# Patient Record
Sex: Female | Born: 1990 | Race: White | Hispanic: No | Smoking: Former smoker
Health system: Southern US, Community
[De-identification: ages and names within clinical notes are randomized; demographics above are authoritative.]

## PROBLEM LIST (undated history)

## (undated) DIAGNOSIS — N83209 Unspecified ovarian cyst, unspecified side: Secondary | ICD-10-CM

## (undated) DIAGNOSIS — K219 Gastro-esophageal reflux disease without esophagitis: Secondary | ICD-10-CM

## (undated) DIAGNOSIS — Z720 Tobacco use: Secondary | ICD-10-CM

## (undated) DIAGNOSIS — F419 Anxiety disorder, unspecified: Secondary | ICD-10-CM

## (undated) DIAGNOSIS — K760 Fatty (change of) liver, not elsewhere classified: Secondary | ICD-10-CM

## (undated) DIAGNOSIS — R000 Tachycardia, unspecified: Secondary | ICD-10-CM

## (undated) DIAGNOSIS — G4700 Insomnia, unspecified: Secondary | ICD-10-CM

## (undated) HISTORY — DX: Anxiety disorder, unspecified: F41.9

## (undated) HISTORY — DX: Unspecified ovarian cyst, unspecified side: N83.209

## (undated) HISTORY — PX: OTHER SURGICAL HISTORY: SHX169

## (undated) HISTORY — DX: Tobacco use: Z72.0

## (undated) HISTORY — PX: CHOLECYSTECTOMY: SHX55

## (undated) HISTORY — PX: INDUCED ABORTION: SHX677

---
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
## 2009-09-05 ENCOUNTER — Emergency Department: Payer: Self-pay | Admitting: Emergency Medicine

## 2009-11-10 ENCOUNTER — Emergency Department: Payer: Self-pay | Admitting: Emergency Medicine

## 2009-12-11 ENCOUNTER — Emergency Department: Payer: Self-pay | Admitting: Emergency Medicine

## 2010-07-02 ENCOUNTER — Emergency Department: Payer: Self-pay | Admitting: Unknown Physician Specialty

## 2010-08-15 ENCOUNTER — Emergency Department: Payer: Self-pay | Admitting: Emergency Medicine

## 2011-05-18 ENCOUNTER — Emergency Department: Payer: Self-pay | Admitting: Emergency Medicine

## 2011-09-30 ENCOUNTER — Observation Stay: Payer: Self-pay | Admitting: Obstetrics and Gynecology

## 2011-10-30 ENCOUNTER — Observation Stay: Payer: Self-pay

## 2011-10-30 LAB — URINALYSIS, COMPLETE
Bilirubin,UR: NEGATIVE
Blood: NEGATIVE
Glucose,UR: NEGATIVE mg/dL (ref 0–75)
Nitrite: NEGATIVE
Ph: 5 (ref 4.5–8.0)
Protein: 30
RBC,UR: 8 /HPF (ref 0–5)
WBC UR: 50 /HPF (ref 0–5)

## 2011-11-01 LAB — URINE CULTURE

## 2011-12-11 ENCOUNTER — Observation Stay: Payer: Self-pay

## 2012-01-10 ENCOUNTER — Observation Stay: Payer: Self-pay | Admitting: Obstetrics and Gynecology

## 2012-01-10 LAB — PIH PROFILE
BUN: 8 mg/dL (ref 7–18)
Co2: 22 mmol/L (ref 21–32)
Creatinine: 0.96 mg/dL (ref 0.60–1.30)
EGFR (African American): >60
EGFR (Non-African Amer.): >60
HCT: 33.3 % — ABNORMAL LOW (ref 35.0–47.0)
HGB: 11.4 g/dL — ABNORMAL LOW (ref 12.0–16.0)
MCH: 28.6 pg (ref 26.0–34.0)
MCHC: 34.1 g/dL (ref 32.0–36.0)
Potassium: 3.5 mmol/L (ref 3.5–5.1)
RBC: 3.97 10*6/uL (ref 3.80–5.20)
SGOT(AST): 23 U/L (ref 15–37)
Sodium: 140 mmol/L (ref 136–145)
WBC: 16.1 10*3/uL — ABNORMAL HIGH (ref 3.6–11.0)

## 2012-01-10 LAB — PROTEIN / CREATININE RATIO, URINE: Protein/Creat. Ratio: 193 mg/gCREAT (ref 0–200)

## 2012-01-12 ENCOUNTER — Inpatient Hospital Stay: Payer: Self-pay

## 2012-01-12 LAB — PIH PROFILE
Anion Gap: 14 (ref 7–16)
BUN: 7 mg/dL (ref 7–18)
Chloride: 106 mmol/L (ref 98–107)
Co2: 19 mmol/L — ABNORMAL LOW (ref 21–32)
Glucose: 85 mg/dL (ref 65–99)
MCH: 28.8 pg (ref 26.0–34.0)
MCV: 85 fL (ref 80–100)
Osmolality: 275 (ref 275–301)
Platelet: 262 10*3/uL (ref 150–440)
Potassium: 3.6 mmol/L (ref 3.5–5.1)
RBC: 4.11 10*6/uL (ref 3.80–5.20)
Sodium: 139 mmol/L (ref 136–145)

## 2012-01-13 LAB — PROTEIN / CREATININE RATIO, URINE: Protein/Creat. Ratio: 181 mg/gCREAT (ref 0–200)

## 2012-01-15 LAB — HEMATOCRIT: HCT: 29.7 % — ABNORMAL LOW (ref 35.0–47.0)

## 2012-01-18 ENCOUNTER — Emergency Department: Payer: Self-pay | Admitting: Unknown Physician Specialty

## 2012-01-18 LAB — URINALYSIS, COMPLETE
Bacteria: NONE SEEN
Bilirubin,UR: NEGATIVE
Glucose,UR: NEGATIVE mg/dL (ref 0–75)
Nitrite: NEGATIVE
Ph: 7 (ref 4.5–8.0)
Protein: NEGATIVE
WBC UR: 23 /HPF (ref 0–5)

## 2012-01-18 LAB — COMPREHENSIVE METABOLIC PANEL
Albumin: 2.5 g/dL — ABNORMAL LOW (ref 3.4–5.0)
Anion Gap: 11 (ref 7–16)
BUN: 7 mg/dL (ref 7–18)
Calcium, Total: 8.2 mg/dL — ABNORMAL LOW (ref 8.5–10.1)
Chloride: 106 mmol/L (ref 98–107)
Creatinine: 0.74 mg/dL (ref 0.60–1.30)
EGFR (African American): >60
EGFR (Non-African Amer.): >60
Glucose: 81 mg/dL (ref 65–99)
Potassium: 3.4 mmol/L — ABNORMAL LOW (ref 3.5–5.1)
SGOT(AST): 49 U/L — ABNORMAL HIGH (ref 15–37)
SGPT (ALT): 41 U/L (ref 12–78)
Sodium: 141 mmol/L (ref 136–145)
Total Protein: 6.7 g/dL (ref 6.4–8.2)

## 2012-01-18 LAB — CBC
HGB: 11.3 g/dL — ABNORMAL LOW (ref 12.0–16.0)
MCH: 27.8 pg (ref 26.0–34.0)
Platelet: 337 10*3/uL (ref 150–440)
RBC: 4.05 10*6/uL (ref 3.80–5.20)
RDW: 14.3 % (ref 11.5–14.5)

## 2012-01-25 ENCOUNTER — Emergency Department: Payer: Self-pay | Admitting: Emergency Medicine

## 2012-05-08 ENCOUNTER — Emergency Department: Payer: Self-pay | Admitting: Emergency Medicine

## 2012-05-08 LAB — COMPREHENSIVE METABOLIC PANEL
Alkaline Phosphatase: 100 U/L (ref 50–136)
Bilirubin,Total: 0.3 mg/dL (ref 0.2–1.0)
Chloride: 110 mmol/L — ABNORMAL HIGH (ref 98–107)
Creatinine: 0.7 mg/dL (ref 0.60–1.30)
EGFR (Non-African Amer.): >60
Glucose: 89 mg/dL (ref 65–99)
Osmolality: 281 (ref 275–301)
SGOT(AST): 18 U/L (ref 15–37)

## 2012-05-08 LAB — URINALYSIS, COMPLETE
Bilirubin,UR: NEGATIVE
Ketone: NEGATIVE
Ph: 5 (ref 4.5–8.0)
Protein: 30
RBC,UR: 1 /HPF (ref 0–5)
Specific Gravity: 1.028 (ref 1.003–1.030)
WBC UR: 6 /HPF (ref 0–5)

## 2012-05-08 LAB — CBC
MCV: 83 fL (ref 80–100)
RBC: 4.57 10*6/uL (ref 3.80–5.20)
RDW: 14.7 % — ABNORMAL HIGH (ref 11.5–14.5)

## 2012-05-08 LAB — HCG, QUANTITATIVE, PREGNANCY: Beta Hcg, Quant.: 2366 m[IU]/mL — ABNORMAL HIGH

## 2012-05-09 LAB — WET PREP, GENITAL

## 2012-10-06 ENCOUNTER — Observation Stay: Payer: Self-pay | Admitting: Advanced Practice Midwife

## 2012-10-06 LAB — URINALYSIS, COMPLETE
Bilirubin,UR: NEGATIVE
Glucose,UR: NEGATIVE mg/dL (ref 0–75)
Ketone: NEGATIVE
Nitrite: NEGATIVE
Ph: 5 (ref 4.5–8.0)
Specific Gravity: 1.029 (ref 1.003–1.030)

## 2012-10-06 LAB — COMPREHENSIVE METABOLIC PANEL
Albumin: 3 g/dL — ABNORMAL LOW (ref 3.4–5.0)
Alkaline Phosphatase: 120 U/L (ref 50–136)
BUN: 7 mg/dL (ref 7–18)
Bilirubin,Total: 0.6 mg/dL (ref 0.2–1.0)
Calcium, Total: 8.9 mg/dL (ref 8.5–10.1)
Chloride: 105 mmol/L (ref 98–107)
Creatinine: 0.71 mg/dL (ref 0.60–1.30)
EGFR (African American): >60
EGFR (Non-African Amer.): >60
Osmolality: 271 (ref 275–301)
Potassium: 3.9 mmol/L (ref 3.5–5.1)
SGOT(AST): 66 U/L — ABNORMAL HIGH (ref 15–37)
SGPT (ALT): 52 U/L (ref 12–78)

## 2012-10-06 LAB — LIPASE, BLOOD: Lipase: 568 U/L — ABNORMAL HIGH (ref 73–393)

## 2012-10-06 LAB — CBC WITH DIFFERENTIAL/PLATELET
Basophil %: 0.2 %
Eosinophil #: 0 10*3/uL (ref 0.0–0.7)
Eosinophil %: 0.1 %
HCT: 37.6 % (ref 35.0–47.0)
HGB: 13 g/dL (ref 12.0–16.0)
Lymphocyte #: 1.7 10*3/uL (ref 1.0–3.6)
Lymphocyte %: 10.1 %
MCHC: 34.6 g/dL (ref 32.0–36.0)
MCV: 88 fL (ref 80–100)
Monocyte %: 3.8 %
Neutrophil #: 14.3 10*3/uL — ABNORMAL HIGH (ref 1.4–6.5)
Platelet: 244 10*3/uL (ref 150–440)
WBC: 16.7 10*3/uL — ABNORMAL HIGH (ref 3.6–11.0)

## 2012-10-07 LAB — HEPATIC FUNCTION PANEL A (ARMC)
Albumin: 2.5 g/dL — ABNORMAL LOW (ref 3.4–5.0)
Alkaline Phosphatase: 125 U/L (ref 50–136)
Bilirubin, Direct: 0.1 mg/dL (ref 0.00–0.20)
Bilirubin,Total: 0.5 mg/dL (ref 0.2–1.0)
SGOT(AST): 51 U/L — ABNORMAL HIGH (ref 15–37)
SGPT (ALT): 46 U/L (ref 12–78)
Total Protein: 6.6 g/dL (ref 6.4–8.2)

## 2012-10-07 LAB — LIPASE, BLOOD: Lipase: 154 U/L (ref 73–393)

## 2012-11-07 ENCOUNTER — Inpatient Hospital Stay: Payer: Self-pay | Admitting: Internal Medicine

## 2012-11-07 LAB — COMPREHENSIVE METABOLIC PANEL
Anion Gap: 7 (ref 7–16)
BUN: 7 mg/dL (ref 7–18)
Bilirubin,Total: 1.1 mg/dL — ABNORMAL HIGH (ref 0.2–1.0)
Chloride: 104 mmol/L (ref 98–107)
Co2: 25 mmol/L (ref 21–32)
Creatinine: 0.73 mg/dL (ref 0.60–1.30)
EGFR (Non-African Amer.): >60
Glucose: 84 mg/dL (ref 65–99)
Osmolality: 269 (ref 275–301)
SGPT (ALT): 18 U/L (ref 12–78)
Sodium: 136 mmol/L (ref 136–145)
Total Protein: 7.2 g/dL (ref 6.4–8.2)

## 2012-11-07 LAB — URINALYSIS, COMPLETE
Blood: NEGATIVE
Nitrite: NEGATIVE
Ph: 5 (ref 4.5–8.0)
RBC,UR: 3 /HPF (ref 0–5)
Specific Gravity: 1.025 (ref 1.003–1.030)

## 2012-11-07 LAB — CBC
HCT: 36 % (ref 35.0–47.0)
MCHC: 34.8 g/dL (ref 32.0–36.0)
MCV: 88 fL (ref 80–100)
RBC: 4.11 10*6/uL (ref 3.80–5.20)

## 2012-11-08 LAB — BASIC METABOLIC PANEL
BUN: 7 mg/dL (ref 7–18)
Chloride: 108 mmol/L — ABNORMAL HIGH (ref 98–107)
Co2: 22 mmol/L (ref 21–32)
EGFR (Non-African Amer.): >60
Glucose: 66 mg/dL (ref 65–99)
Potassium: 3.8 mmol/L (ref 3.5–5.1)
Sodium: 138 mmol/L (ref 136–145)

## 2012-11-08 LAB — CBC WITH DIFFERENTIAL/PLATELET
Basophil #: 0 10*3/uL (ref 0.0–0.1)
Eosinophil #: 0.1 10*3/uL (ref 0.0–0.7)
HCT: 32.5 % — ABNORMAL LOW (ref 35.0–47.0)
HGB: 11.5 g/dL — ABNORMAL LOW (ref 12.0–16.0)
Lymphocyte #: 1.9 10*3/uL (ref 1.0–3.6)
MCHC: 35.4 g/dL (ref 32.0–36.0)
MCV: 87 fL (ref 80–100)
Monocyte #: 0.6 x10 3/mm (ref 0.2–0.9)
Neutrophil #: 7.6 10*3/uL — ABNORMAL HIGH (ref 1.4–6.5)
RBC: 3.71 10*6/uL — ABNORMAL LOW (ref 3.80–5.20)
RDW: 13.1 % (ref 11.5–14.5)

## 2012-11-08 LAB — LIPASE, BLOOD: Lipase: 114 U/L (ref 73–393)

## 2012-11-17 ENCOUNTER — Inpatient Hospital Stay: Payer: Self-pay | Admitting: Obstetrics and Gynecology

## 2012-11-17 LAB — COMPREHENSIVE METABOLIC PANEL
Albumin: 2.8 g/dL — ABNORMAL LOW (ref 3.4–5.0)
Anion Gap: 7 (ref 7–16)
BUN: 8 mg/dL (ref 7–18)
Chloride: 105 mmol/L (ref 98–107)
Co2: 24 mmol/L (ref 21–32)
Osmolality: 269 (ref 275–301)
SGOT(AST): 36 U/L (ref 15–37)
SGPT (ALT): 29 U/L (ref 12–78)
Sodium: 136 mmol/L (ref 136–145)
Total Protein: 6.9 g/dL (ref 6.4–8.2)

## 2012-11-17 LAB — URINALYSIS, COMPLETE
Glucose,UR: NEGATIVE mg/dL (ref 0–75)
Ph: 6 (ref 4.5–8.0)
Squamous Epithelial: 93
WBC UR: 29 /HPF (ref 0–5)

## 2012-11-17 LAB — CBC WITH DIFFERENTIAL/PLATELET
Basophil #: 0.1 10*3/uL (ref 0.0–0.1)
Eosinophil #: 0 10*3/uL (ref 0.0–0.7)
Eosinophil %: 0.2 %
HCT: 35.5 % (ref 35.0–47.0)
HGB: 12.3 g/dL (ref 12.0–16.0)
Lymphocyte #: 1.3 10*3/uL (ref 1.0–3.6)
MCH: 30.1 pg (ref 26.0–34.0)
MCHC: 34.7 g/dL (ref 32.0–36.0)
Monocyte %: 5.4 %
Neutrophil %: 83.3 %
RBC: 4.09 10*6/uL (ref 3.80–5.20)
RDW: 13.2 % (ref 11.5–14.5)
WBC: 12.5 10*3/uL — ABNORMAL HIGH (ref 3.6–11.0)

## 2012-11-17 LAB — HCG, QUANTITATIVE, PREGNANCY: Beta Hcg, Quant.: 3098 m[IU]/mL — ABNORMAL HIGH

## 2012-11-18 LAB — LIPASE, BLOOD: Lipase: 196 U/L (ref 73–393)

## 2012-11-21 LAB — URINE CULTURE

## 2012-12-29 ENCOUNTER — Inpatient Hospital Stay: Payer: Self-pay | Admitting: Obstetrics and Gynecology

## 2012-12-29 LAB — CBC WITH DIFFERENTIAL/PLATELET
Basophil #: 0.2 10*3/uL — ABNORMAL HIGH (ref 0.0–0.1)
Eosinophil %: 0.4 %
Lymphocyte %: 12.2 %
MCH: 29.9 pg (ref 26.0–34.0)
MCHC: 35.1 g/dL (ref 32.0–36.0)
Monocyte #: 1.2 x10 3/mm — ABNORMAL HIGH (ref 0.2–0.9)
Monocyte %: 7 %
Neutrophil #: 13.1 10*3/uL — ABNORMAL HIGH (ref 1.4–6.5)
Platelet: 228 10*3/uL (ref 150–440)
RBC: 4.07 10*6/uL (ref 3.80–5.20)
RDW: 13.8 % (ref 11.5–14.5)
WBC: 16.7 10*3/uL — ABNORMAL HIGH (ref 3.6–11.0)

## 2012-12-29 LAB — GC/CHLAMYDIA PROBE AMP

## 2012-12-30 LAB — HEMATOCRIT: HCT: 31.3 % — ABNORMAL LOW (ref 35.0–47.0)

## 2013-02-11 ENCOUNTER — Emergency Department: Payer: Self-pay | Admitting: Emergency Medicine

## 2013-02-11 LAB — COMPREHENSIVE METABOLIC PANEL WITH GFR
Albumin: 3.7 g/dL
Alkaline Phosphatase: 115 U/L
Anion Gap: 4 — ABNORMAL LOW
BUN: 14 mg/dL
Bilirubin,Total: 0.3 mg/dL
Calcium, Total: 8.6 mg/dL
Chloride: 107 mmol/L
Co2: 28 mmol/L
Creatinine: 0.97 mg/dL
EGFR (African American): >60
EGFR (Non-African Amer.): >60
Glucose: 100 mg/dL — ABNORMAL HIGH
Osmolality: 278
Potassium: 3.8 mmol/L
SGOT(AST): 44 U/L — ABNORMAL HIGH
SGPT (ALT): 26 U/L
Sodium: 139 mmol/L
Total Protein: 8.1 g/dL

## 2013-02-11 LAB — LIPASE, BLOOD: Lipase: 121 U/L (ref 73–393)

## 2013-02-11 LAB — CBC WITH DIFFERENTIAL/PLATELET
Basophil #: 0.1 10*3/uL (ref 0.0–0.1)
Basophil %: 1 %
Eosinophil #: 0 10*3/uL (ref 0.0–0.7)
Eosinophil %: 0.3 %
HCT: 40.3 % (ref 35.0–47.0)
HGB: 13.4 g/dL (ref 12.0–16.0)
Lymphocyte #: 1.9 10*3/uL (ref 1.0–3.6)
Lymphocyte %: 14.8 %
MCHC: 33.2 g/dL (ref 32.0–36.0)
MCV: 84 fL (ref 80–100)
Monocyte #: 0.5 x10 3/mm (ref 0.2–0.9)
Neutrophil #: 10.4 10*3/uL — ABNORMAL HIGH (ref 1.4–6.5)
Neutrophil %: 80.4 %
Platelet: 316 10*3/uL (ref 150–440)
RDW: 13.8 % (ref 11.5–14.5)
WBC: 12.9 10*3/uL — ABNORMAL HIGH (ref 3.6–11.0)

## 2013-02-12 LAB — URINALYSIS, COMPLETE
Bilirubin,UR: NEGATIVE
Blood: NEGATIVE
Glucose,UR: NEGATIVE mg/dL (ref 0–75)
Leukocyte Esterase: NEGATIVE
Nitrite: NEGATIVE
Ph: 7 (ref 4.5–8.0)
Protein: 30
Squamous Epithelial: 1
WBC UR: 4 /HPF (ref 0–5)

## 2013-09-01 ENCOUNTER — Emergency Department: Payer: Self-pay | Admitting: Emergency Medicine

## 2013-09-01 LAB — CBC WITH DIFFERENTIAL/PLATELET
Basophil #: 0.1 10*3/uL (ref 0.0–0.1)
Basophil %: 0.4 %
Eosinophil #: 0.1 10*3/uL (ref 0.0–0.7)
Eosinophil %: 0.5 %
HCT: 46.5 % (ref 35.0–47.0)
HGB: 15.1 g/dL (ref 12.0–16.0)
LYMPHS ABS: 2 10*3/uL (ref 1.0–3.6)
Lymphocyte %: 13.1 %
MCH: 28.6 pg (ref 26.0–34.0)
MCHC: 32.5 g/dL (ref 32.0–36.0)
MCV: 88 fL (ref 80–100)
Monocyte #: 0.7 x10 3/mm (ref 0.2–0.9)
Monocyte %: 4.5 %
Neutrophil #: 12.5 10*3/uL — ABNORMAL HIGH (ref 1.4–6.5)
Neutrophil %: 81.5 %
Platelet: 276 10*3/uL (ref 150–440)
RBC: 5.27 10*6/uL — ABNORMAL HIGH (ref 3.80–5.20)
RDW: 13.1 % (ref 11.5–14.5)
WBC: 15.3 10*3/uL — AB (ref 3.6–11.0)

## 2013-09-01 LAB — LIPASE, BLOOD: Lipase: 435 U/L — ABNORMAL HIGH (ref 73–393)

## 2013-09-01 LAB — URINALYSIS, COMPLETE
Bacteria: NONE SEEN
Bilirubin,UR: NEGATIVE
Glucose,UR: NEGATIVE mg/dL (ref 0–75)
Ketone: NEGATIVE
LEUKOCYTE ESTERASE: NEGATIVE
Nitrite: NEGATIVE
PH: 7 (ref 4.5–8.0)
Protein: NEGATIVE
RBC,UR: <1 /HPF (ref 0–5)
Specific Gravity: 1.02 (ref 1.003–1.030)
WBC UR: 3 /HPF (ref 0–5)

## 2013-09-01 LAB — COMPREHENSIVE METABOLIC PANEL
ALBUMIN: 4 g/dL (ref 3.4–5.0)
ANION GAP: 4 — AB (ref 7–16)
Alkaline Phosphatase: 81 U/L
BILIRUBIN TOTAL: 0.6 mg/dL (ref 0.2–1.0)
BUN: 11 mg/dL (ref 7–18)
Calcium, Total: 8.6 mg/dL (ref 8.5–10.1)
Chloride: 107 mmol/L (ref 98–107)
Co2: 27 mmol/L (ref 21–32)
Creatinine: 0.8 mg/dL (ref 0.60–1.30)
EGFR (African American): >60
EGFR (Non-African Amer.): >60
Glucose: 78 mg/dL (ref 65–99)
Osmolality: 274 (ref 275–301)
Potassium: 3.6 mmol/L (ref 3.5–5.1)
SGOT(AST): 38 U/L — ABNORMAL HIGH (ref 15–37)
SGPT (ALT): 22 U/L (ref 12–78)
Sodium: 138 mmol/L (ref 136–145)
TOTAL PROTEIN: 8.5 g/dL — AB (ref 6.4–8.2)

## 2013-11-12 ENCOUNTER — Emergency Department: Payer: Self-pay | Admitting: Emergency Medicine

## 2013-11-12 LAB — CBC WITH DIFFERENTIAL/PLATELET
Basophil #: 0.1 10*3/uL (ref 0.0–0.1)
Basophil %: 0.7 %
EOS PCT: 0.5 %
Eosinophil #: 0 10*3/uL (ref 0.0–0.7)
HCT: 42.2 % (ref 35.0–47.0)
HGB: 13.8 g/dL (ref 12.0–16.0)
LYMPHS PCT: 20.1 %
Lymphocyte #: 1.9 10*3/uL (ref 1.0–3.6)
MCH: 29.1 pg (ref 26.0–34.0)
MCHC: 32.7 g/dL (ref 32.0–36.0)
MCV: 89 fL (ref 80–100)
Monocyte #: 0.3 x10 3/mm (ref 0.2–0.9)
Monocyte %: 3.5 %
Neutrophil #: 7.1 10*3/uL — ABNORMAL HIGH (ref 1.4–6.5)
Neutrophil %: 75.2 %
Platelet: 261 10*3/uL (ref 150–440)
RBC: 4.73 10*6/uL (ref 3.80–5.20)
RDW: 12.7 % (ref 11.5–14.5)
WBC: 9.4 10*3/uL (ref 3.6–11.0)

## 2013-11-12 LAB — COMPREHENSIVE METABOLIC PANEL
ALK PHOS: 69 U/L
ALT: 26 U/L
Albumin: 3.7 g/dL (ref 3.4–5.0)
Anion Gap: 8 (ref 7–16)
BUN: 9 mg/dL (ref 7–18)
Bilirubin,Total: 0.5 mg/dL (ref 0.2–1.0)
CALCIUM: 8.6 mg/dL (ref 8.5–10.1)
Chloride: 107 mmol/L (ref 98–107)
Co2: 27 mmol/L (ref 21–32)
Creatinine: 0.93 mg/dL (ref 0.60–1.30)
EGFR (African American): >60
EGFR (Non-African Amer.): >60
Glucose: 108 mg/dL — ABNORMAL HIGH (ref 65–99)
OSMOLALITY: 282 (ref 275–301)
Potassium: 3.6 mmol/L (ref 3.5–5.1)
SGOT(AST): 28 U/L (ref 15–37)
Sodium: 142 mmol/L (ref 136–145)
TOTAL PROTEIN: 7.6 g/dL (ref 6.4–8.2)

## 2013-11-12 LAB — URINALYSIS, COMPLETE
Bilirubin,UR: NEGATIVE
Blood: NEGATIVE
GLUCOSE, UR: NEGATIVE mg/dL (ref 0–75)
Ketone: NEGATIVE
Nitrite: NEGATIVE
Ph: 7 (ref 4.5–8.0)
Protein: 30
RBC,UR: 2 /HPF (ref 0–5)
Specific Gravity: 1.024 (ref 1.003–1.030)
Squamous Epithelial: 10
WBC UR: 4 /HPF (ref 0–5)

## 2013-11-12 LAB — LIPASE, BLOOD: LIPASE: 127 U/L (ref 73–393)

## 2013-11-12 LAB — TROPONIN I: Troponin-I: <0.02 ng/mL

## 2013-11-30 ENCOUNTER — Emergency Department: Payer: Self-pay | Admitting: Emergency Medicine

## 2013-11-30 LAB — COMPREHENSIVE METABOLIC PANEL
ALBUMIN: 3.7 g/dL (ref 3.4–5.0)
ALT: 20 U/L
ANION GAP: 6 — AB (ref 7–16)
Alkaline Phosphatase: 74 U/L
BUN: 13 mg/dL (ref 7–18)
Bilirubin,Total: 0.3 mg/dL (ref 0.2–1.0)
CHLORIDE: 107 mmol/L (ref 98–107)
CREATININE: 0.86 mg/dL (ref 0.60–1.30)
Calcium, Total: 8.8 mg/dL (ref 8.5–10.1)
Co2: 26 mmol/L (ref 21–32)
Glucose: 103 mg/dL — ABNORMAL HIGH (ref 65–99)
OSMOLALITY: 278 (ref 275–301)
Potassium: 3.7 mmol/L (ref 3.5–5.1)
SGOT(AST): 13 U/L — ABNORMAL LOW (ref 15–37)
Sodium: 139 mmol/L (ref 136–145)
TOTAL PROTEIN: 7.8 g/dL (ref 6.4–8.2)

## 2013-11-30 LAB — CBC WITH DIFFERENTIAL/PLATELET
BASOS ABS: 0.1 10*3/uL (ref 0.0–0.1)
Basophil %: 1.1 %
Eosinophil #: 0.1 10*3/uL (ref 0.0–0.7)
Eosinophil %: 0.9 %
HCT: 43 % (ref 35.0–47.0)
HGB: 14 g/dL (ref 12.0–16.0)
Lymphocyte #: 2.9 10*3/uL (ref 1.0–3.6)
Lymphocyte %: 34.8 %
MCH: 29.4 pg (ref 26.0–34.0)
MCHC: 32.6 g/dL (ref 32.0–36.0)
MCV: 90 fL (ref 80–100)
MONOS PCT: 8.1 %
Monocyte #: 0.7 x10 3/mm (ref 0.2–0.9)
NEUTROS PCT: 55.1 %
Neutrophil #: 4.6 10*3/uL (ref 1.4–6.5)
Platelet: 289 10*3/uL (ref 150–440)
RBC: 4.78 10*6/uL (ref 3.80–5.20)
RDW: 13.2 % (ref 11.5–14.5)
WBC: 8.4 10*3/uL (ref 3.6–11.0)

## 2013-11-30 LAB — URINALYSIS, COMPLETE
Bilirubin,UR: NEGATIVE
Blood: NEGATIVE
Glucose,UR: NEGATIVE mg/dL (ref 0–75)
Ketone: NEGATIVE
Leukocyte Esterase: NEGATIVE
Nitrite: NEGATIVE
PH: 5 (ref 4.5–8.0)
Protein: NEGATIVE
RBC,UR: 1 /HPF (ref 0–5)
SPECIFIC GRAVITY: 1.027 (ref 1.003–1.030)
Squamous Epithelial: 2

## 2013-11-30 LAB — LIPASE, BLOOD: Lipase: 204 U/L (ref 73–393)

## 2013-12-15 ENCOUNTER — Inpatient Hospital Stay: Payer: Self-pay | Admitting: Surgery

## 2013-12-15 LAB — URINALYSIS, COMPLETE
Bilirubin,UR: NEGATIVE
Blood: NEGATIVE
GLUCOSE, UR: NEGATIVE mg/dL (ref 0–75)
NITRITE: NEGATIVE
PH: 7 (ref 4.5–8.0)
RBC,UR: <1 /HPF (ref 0–5)
Specific Gravity: 1.03 (ref 1.003–1.030)
WBC UR: 4 /HPF (ref 0–5)

## 2013-12-15 LAB — COMPREHENSIVE METABOLIC PANEL
ALBUMIN: 3.9 g/dL (ref 3.4–5.0)
ALK PHOS: 74 U/L
AST: 31 U/L (ref 15–37)
Anion Gap: 8 (ref 7–16)
BILIRUBIN TOTAL: 0.6 mg/dL (ref 0.2–1.0)
BUN: 10 mg/dL (ref 7–18)
Calcium, Total: 8.8 mg/dL (ref 8.5–10.1)
Chloride: 105 mmol/L (ref 98–107)
Co2: 27 mmol/L (ref 21–32)
Creatinine: 1.06 mg/dL (ref 0.60–1.30)
EGFR (African American): >60
GLUCOSE: 113 mg/dL — AB (ref 65–99)
Osmolality: 279 (ref 275–301)
Potassium: 3.4 mmol/L — ABNORMAL LOW (ref 3.5–5.1)
SGPT (ALT): 32 U/L
Sodium: 140 mmol/L (ref 136–145)
Total Protein: 8.2 g/dL (ref 6.4–8.2)

## 2013-12-15 LAB — CBC WITH DIFFERENTIAL/PLATELET
BASOS PCT: 0.6 %
Basophil #: 0.1 10*3/uL (ref 0.0–0.1)
Eosinophil #: 0 10*3/uL (ref 0.0–0.7)
Eosinophil %: 0.3 %
HCT: 43.7 % (ref 35.0–47.0)
HGB: 14.6 g/dL (ref 12.0–16.0)
LYMPHS PCT: 14 %
Lymphocyte #: 1.8 10*3/uL (ref 1.0–3.6)
MCH: 29.3 pg (ref 26.0–34.0)
MCHC: 33.4 g/dL (ref 32.0–36.0)
MCV: 88 fL (ref 80–100)
MONO ABS: 0.5 x10 3/mm (ref 0.2–0.9)
Monocyte %: 3.7 %
Neutrophil #: 10.5 10*3/uL — ABNORMAL HIGH (ref 1.4–6.5)
Neutrophil %: 81.4 %
Platelet: 289 10*3/uL (ref 150–440)
RBC: 4.99 10*6/uL (ref 3.80–5.20)
RDW: 12.9 % (ref 11.5–14.5)
WBC: 12.9 10*3/uL — ABNORMAL HIGH (ref 3.6–11.0)

## 2013-12-15 LAB — LIPASE, BLOOD: Lipase: 398 U/L — ABNORMAL HIGH (ref 73–393)

## 2013-12-16 LAB — CBC WITH DIFFERENTIAL/PLATELET
BASOS ABS: 0 10*3/uL (ref 0.0–0.1)
BASOS PCT: 0.2 %
Eosinophil #: 0 10*3/uL (ref 0.0–0.7)
Eosinophil %: 0.4 %
HCT: 40.1 % (ref 35.0–47.0)
HGB: 13 g/dL (ref 12.0–16.0)
Lymphocyte #: 2.4 10*3/uL (ref 1.0–3.6)
Lymphocyte %: 30.2 %
MCH: 28.7 pg (ref 26.0–34.0)
MCHC: 32.5 g/dL (ref 32.0–36.0)
MCV: 88 fL (ref 80–100)
MONO ABS: 0.5 x10 3/mm (ref 0.2–0.9)
Monocyte %: 5.7 %
NEUTROS ABS: 5.1 10*3/uL (ref 1.4–6.5)
Neutrophil %: 63.5 %
Platelet: 225 10*3/uL (ref 150–440)
RBC: 4.53 10*6/uL (ref 3.80–5.20)
RDW: 12.6 % (ref 11.5–14.5)
WBC: 8 10*3/uL (ref 3.6–11.0)

## 2013-12-16 LAB — COMPREHENSIVE METABOLIC PANEL
ALK PHOS: 65 U/L
ANION GAP: 3 — AB (ref 7–16)
AST: 33 U/L (ref 15–37)
Albumin: 3.4 g/dL (ref 3.4–5.0)
BUN: 8 mg/dL (ref 7–18)
Bilirubin,Total: 0.4 mg/dL (ref 0.2–1.0)
CALCIUM: 7.9 mg/dL — AB (ref 8.5–10.1)
CREATININE: 0.93 mg/dL (ref 0.60–1.30)
Chloride: 107 mmol/L (ref 98–107)
Co2: 30 mmol/L (ref 21–32)
EGFR (Non-African Amer.): >60
Glucose: 77 mg/dL (ref 65–99)
OSMOLALITY: 277 (ref 275–301)
Potassium: 3.7 mmol/L (ref 3.5–5.1)
SGPT (ALT): 44 U/L
SODIUM: 140 mmol/L (ref 136–145)
Total Protein: 7.1 g/dL (ref 6.4–8.2)

## 2013-12-16 LAB — LIPASE, BLOOD: Lipase: 431 U/L — ABNORMAL HIGH (ref 73–393)

## 2013-12-16 LAB — PREGNANCY, URINE: Pregnancy Test, Urine: NEGATIVE m[IU]/mL

## 2013-12-22 LAB — PATHOLOGY REPORT

## 2014-07-14 NOTE — Consult Note (Signed)
PATIENT NAME:  Emily Nash, Emily Nash MR#:  [id number] DATE OF BIRTH:  01-Jan-1991  DATE OF CONSULTATION:  11/18/2012  REFERRING PHYSICIAN:  Dr. Thomasene Mohair of OB CONSULTING PHYSICIAN:  Emily Athens. Amiaya Mcneeley, MD  PRIMARY CARE PHYSICIAN:  Nonlocal.  REASON FOR CONSULTATION:  Recurrent pancreatitis.  HISTORY OF PRESENT ILLNESS:  Emily Nash is a 24 year old G70, P52-0-2-1 Caucasian female at 23 weeks with estimated due date of 01/05/2013 who is presenting with her third episode of nausea, vomiting, right upper quadrant pain secondary to pancreatitis.  She was admitted most recently approximately 1 week ago as well as one episode earlier on in her pregnancy back in July.  Her previous episodes were deemed likely secondary to gallstone pancreatitis.  She was evaluated by surgery who recommended cholecystectomy whenever able.  Once again, she was most recently discharged from [facility] on 11/08/2012 for pancreatitis.  Throughout that course her lipase was originally 1900.  She is now presenting with 1 day duration of intermittent right upper quadrant postprandial, however she is able to tolerate by mouth intake including fluids as well as food.  She is presenting with a 1 day duration of symptoms including a dull aching of the right upper quadrant with radiation to the back.  This is worsened with food intake.  She has found no relief.  She is rating the pain between a 6 and 7 out of 10 in intensity.  She has been evaluated by the OB service at this time who fortunately found no complications with the pregnancy.  Once again, we are being consulted for pancreatitis.    REVIEW OF SYSTEMS:   CONSTITUTIONAL:  She states she is in mild discomfort secondary to pain.   EYES:  She has no vision problems including visual acuity.  EARS, NOSE, THROAT, MOUTH:  She denies any oral ulcers and able to tolerate by mouth intake.  CARDIOVASCULAR:  Denies any chest pain or palpitations.  RESPIRATORY:  Denies any shortness of breath, cough,  wheeze. GASTROINTESTINAL:  She does attest to having nausea as well as right upper quadrant pain related to food, but denies any emesis.  She also mentions constipation.  The last bowel movement approximately 1 week ago.  GENITOURINARY:  Denies any symptoms of urinary frequency or dysuria.  MUSCULOSKELETAL:  Denies any aches or pains.   SKIN:  No rashes or new lesions.   NEUROLOGIC:  Denies any paralysis or paresthesia. PSYCHIATRIC:  She denies any suicidal or homicidal ideations. ENDOCRINE:  Denies any urinary frequency or other changes.  HEMATOLOGY AND LYMPHATIC:  Denies any lymphadenopathy or easy bleeding or bruisability. ALLERGY AND IMMUNOLOGY:  No active issues.  Otherwise, full review of systems performed is negative.   PAST MEDICAL HISTORY:  Significant for pancreatitis with 2 previous episodes as well as cholelithiasis.  This is her 4th pregnancy as she has had 2 miscarriages and 1 child at home, a 46 month boy as well as a history of gastroesophageal reflux disease.  Otherwise, she is relatively healthy.    ALLERGIES:  No known drug allergies.   SOCIAL HISTORY:  She smokes between 5 and 10 cigarettes daily and occasional alcohol intake, though not since pregnancy.    PAST SURGICAL HISTORY:  None.  FAMILY HISTORY:  Positive for lung cancer in a grandfather.  COPD in her mother.    HOME MEDICATIONS:  Says she does have a prescription for Protonix.  She is not actively taking this medication.    PHYSICAL EXAMINATION:  VITAL SIGNS:  Heart  rate 76, respirations 18, blood pressure 97/52, pulse ox 98, saturating well on room air.   GENERAL:  No acute distress, awake, alert, oriented x 3.   HEENT:  Normocephalic, atraumatic.  Extraocular muscles intact.  Pupils equal, round, reactive to light as well as accommodation.  Moist mucosal membranes.  No thyromegaly noted.  CARDIOVASCULAR:  S1, S2, regular rate and rhythm.  No murmurs, rubs or gallops.  PULMONARY:  Clear to auscultation  bilaterally without wheezes, rubs or rhonchi.   ABDOMEN:  As per gestation age.  Mild right upper quadrant tenderness to palpation without rebound, guarding or motion tenderness.  EXTREMITIES:  Reveal no cyanosis, edema or clubbing. NEUROLOGIC:  Cranial nerves II through XII intact with no gross focal neurological deficits.    LABORATORY DATA:  Sodium 136, potassium 3.7, chloride 105, bicarb of 24, BUN 8, creatinine 0.62, glucose 81, lipase of 1088.  Total protein 6.9, albumin 2.8, bilirubin 1.1, alkaline phosphatase 135, AST of 36, ALT 29, WBC of 12.5, hemoglobin 12.3, hematocrit 35.5, platelets 211.  Urinalysis cloudy urine with 3+ positive leukocyte esterase.  Her previous lipase on August 17th was 1907 which resolved to 114 upon discharge.    ASSESSMENT AND PLAN:  A 24 year old female at [redacted] weeks gestation presenting with recurrent right upper quadrant pain with recent diagnosis of pancreatitis in the setting of cholelithiasis.  She was discharged from [facility] on 11/08/2012 for pancreatitis.  At that time her lipase was 1907, which resolved to the low 100s after one day of IV fluids.  She has currently had one day duration of intermittent right upper quadrant pain which is postprandial.  She is presenting with continued symptoms.  Fortunately at this time she is able to tolerate by mouth intake including liquids as well as solids.  We are being consulted once again for recurrent pancreatitis. 1.  Pancreatitis secondary to gallstones.  This is a known problem as she has actually been evaluated by surgery in the past for this issue.  She ultimately will need cholecystectomy in the future.  In the meantime, recommended continuation of IV fluids so that she will be receiving  5-6 liters in the first 24 hours as well as pain control as needed with IV morphine or other agents.  Would recommend checking a repeat lipase in the morning.  2.  Asymptomatic bacteruria.  We will continue treatment with antibiotics per  the OB team. 3.  Constipation: would recommend starting Colace and then adding MiraLAX as required for constipation seeing as her last bowel movement was approximately 7 days ago as this may also be causing further issues with abdominal pain.  4.  A [redacted] week gestation.  Recommended continued care as per the primary service of OB.    TOTAL CONSULTATION TIME:  37 minutes.   ____________________________ Emily Athens. Payeton Germani, MD dkh:ea D: 11/18/2012 01:05:13 ET T: 11/18/2012 03:21:12 ET JOB#: [id number]  cc: Emily Athens. Bama Hanselman, MD, <Dictator> Rashea Hoskie Synetta Shadow MD ELECTRONICALLY SIGNED 11/18/2012 5:47

## 2014-07-14 NOTE — H&P (Signed)
PATIENT NAME:  Emily Nash, Emily Nash MR#:  [id number] DATE OF BIRTH:  02/09/1991  DATE OF ADMISSION:  11/07/2012  REASON FOR ADMISSION: Abdominal pain intractable nausea and vomiting/pancreatitis.   PRIMARY CARE PHYSICIAN: [facility]. Dr. Elta Guadeloupe.   REFERRING PHYSICIAN:  Dr.  Glennie Isle.   HISTORY OF PRESENT ILLNESS: This is a very nice 31 week and 4 day G28 who is 23 years old. She comes we is a history of being healthy, recently admitted here to the hospital on 10/06/2012 with a case of mild epigastric pain, diagnosed with gallstone pancreatitis treated and discharged home in good condition. Apparently, the patient has had GI problems since the birth of her last baby, which happened nine months ago. The patient got pregnant right after her delivery. She had 11 pound baby and she started complaining of fried quadrant pain intermittently than the right after delivery. The patient thought that he was just related to all of the effort that she had to do to push her baby out since he was a big baby, but problems continued to get worse and worse and in July, she had a really bad attack, and it was when she was diagnosed with gallstone pancreatitis. Yesterday, the patient had beginning of the pain. She was not eating anything since at 11:00 a.m. At 5:30, she started having some significant distress. The prior day around 1:30, she started having some pain. She had a sharp pain that  was severe, 10 out of 10, associated with one episode of vomiting. After the vomiting passed, the pain went completely away. Now the pain has been ongoing on and off. She always has some degree of pain and some soreness located on the right upper quadrant with radiation to the back. When the pain comes severe, 10 out of 10, stabbing with electric shock going into her back. Alleviating factor seems to be vomiting. It is worse with food. The patient has been trying to avoid eating fats, but with her cravings during this pregnancy, she  has been eating ice cream sandwiches.  The pain right now is 2-3 out of 10 and it is sore, dull pain in her right upper quadrant with mild radiation to the back. The patient states that she has been constipated, has bowel movements almost daily, but they are very hard. There has not been any blood in stool. No acholia. No jaundice. Her urine tends to get really concentrated because she has not been able to eat or drink anything. At this moment, the patient is stable. She is under good condition. Her pain has improved significantly, but her lipase is 1900 for which we are going to admit her, treat her pancreatitis and observe.   REVIEW OF SYSTEMS:  A 12-system review is done.   CONSTITUTIONAL: Denies any fever, fatigue, weakness, weight. Positive weight gain with her pregnancy, suspected.  EYES:  No blurry vision, double vision, or glaucoma.  EARS, NOSE, THROAT: No tinnitus. No ear pain, no postnasal drip.  RESPIRATORY: No cough, wheezing, hemoptysis or painful respirations. The patient is a smoker. She smokes 5 to 10 cigarettes a day, even during the pregnancy. We did have a long discussion about this, about seven minutes, were used to talk about smoking cessation and about risk of smoking to the mother and the baby. The patient agrees to quit smoking today: Counseling given for seven minutes.  CARDIOVASCULAR: No chest pain, orthopnea, palpitations or syncope.  GASTROINTESTINAL: Positive nausea. Positive vomiting. The patient has vomited about 7 or  8 times since last night. No jaundice. No rectal bleeding. No hemorrhoids. No change on bowel habits. The patient seems to be constipated for a long time.  GYNECOLOGIC: No breast masses. No sexually transmitted diseases.  GENITOURINARY: No dysuria, hematuria or significant changes in frequency. The patient says her urine is very concentrated.  ENDOCRINE: No polyuria, polydipsia, polyphagia, cold or heat intolerance.  HEMATOLOGIC AND LYMPHATIC: No anemia, easy  bruising or bleeding.  SKIN: No rashes, petechiae or new lesions.  MUSCULOSKELETAL: No significant neck pain, back pain or joint deformity.  NEUROLOGIC: No numbness, tingling. No CVAs or transient ischemic attacks.  PSYCHIATRIC: No significant insomnia or depression.   PAST MEDICAL HISTORY: The patient has history of cholelithiasis and previous history of pancreatitis. She is pregnant with her fourth pregnancy, she lost her first two babies and she has a 80-month-old at home.   CURRENT MEDICATIONS: Protonix, but the patient is not taking.   ALLERGIES: Not known drug allergies.   SOCIAL HISTORY: The patient smokes 5 to 10 cigarettes a day as mentioned above. It apparently has been reported that she drinks alcohol occasionally, but the patient is telling me that she has not drank since this pregnancy. She does not work, at this moment. She lives with her boyfriend and her 53-month-old.   PAST SURGICAL HISTORY: Negative.   FAMILY HISTORY: Positive for lung cancer in her grandfather, chronic obstructive pulmonary disease in her mother. No coronary artery disease or other medical problems.   PHYSICAL EXAMINATION: VITAL SIGNS: Blood pressure 100/55, pulse 94, respirations 18, temperature 97.8.  GENERAL: The patient is alert, oriented x 3, in no acute distress. No respiratory distress. Hemodynamically stable.  HEENT: Pupils are equal and reactive. Extraocular movements are intact. Mucosae are dry. Anicteric sclerae. Pink conjunctivae. No oral lesions. No oropharyngeal exudates.  NECK: Supple. No JVD. No thyromegaly. No adenopathy. No carotid bruits.  CARDIOVASCULAR: Regular rhythm. No murmurs, rubs or gallops are appreciated. No tenderness to palpation of anterior chest wall.  LUNGS: Clear without any wheezing or crepitus. No use of accessory muscles.  ABDOMEN: Soft, pregnant of  31 weeks. At this moment, no tenderness to palpation. The patient states that she feels sore mostly inside the abdomen,  but there is is no rebound, tenderness. There is no guarding. No masses.  GENITOURINARY: Deferred.  EXTREMITIES: No edema, cyanosis or clubbing. Pulses +2. Capillary refill less than 3 seconds.  SKIN: No rashes or petechiae.  NEUROLOGIC: Cranial nerves II through XII intact. Strength is five out of five in all four extremities. No focal findings.  PSYCHIATRIC: Mood is normal. The patient actually looks very relaxed. No significant agitation. The patient is alert, oriented x 3.   MUSCULOSKELETAL: No significant joint effusions or joint edema.  LYMPHATICS: Negative for lymphadenopathy in the neck or supraclavicular areas   LABORATORY, DIAGNOSTIC AND RADIOLOGIC DATA: Glucose 84, creatinine 0.73, sodium 136, potassium 3.5. Lipase is 1900, bilirubin 1.1, albumin is 2.9. LFTs otherwise within normal limits. White count of 13.7. Urinalysis shows leukocyte esterase +1, 7 white blood cells. No nitrites.   Ultrasound of the right upper quadrant shows cholelithiasis, no definite evidence of cholecystitis. Common bile duct is 3.7 mm diameter.   ASSESSMENT AND PLAN:  1.  This 24 year old female 31weeks and 4 days pregnancy, comes with pancreatitis and cholelithiasis. The patient had a previous episode on July 16, for which she was also hospitalized at this moment. This problem is recurrent. There is no clear indication for surgery at this moment since  she is pregnant, possibly it will better to delay until patient has a baby, unless she has real infection or cholecystitis.  The patient has been advised about her diet has to be in no fat. The patient states that she will try to comply with this recommendation. The patient is going to be on IV fluids, 125 an hour. Lipase is going to be followed up in the morning. Gynecologic consultation to be obtained, as the patient goes to [facility]. At this moment, I am not going to consult surgery unless there is some significant changes. The patient has a mild elevation of  her white count of 1300, which could be just related to the pancreatitis. We are going to follow up on that. If the white count continued to rise, consider antibiotics and call surgery, right away.  2.  Smoking cessation. Education given to the patient for seven minutes. The patient states that she will try to quit smoking.  3.  Dehydration. Continue IV fluids.  4.  Other medical problems seem to be stable.  5.  Gastrointestinal prophylaxis with Protonix. I spent about 45 minutes with this patient.   ____________________________ Felipa Furnace, MD rsg:cc D: 11/07/2012 13:31:11 ET T: 11/07/2012 15:10:39 ET JOB#: [id number]  cc: Felipa Furnace, MD, <Dictator> Samiya Mervin Juanda Chance MD ELECTRONICALLY SIGNED 11/19/2012 12:28

## 2014-07-14 NOTE — Discharge Summary (Signed)
PATIENT NAME:  Emily Nash, Emily Nash MR#:  [id number] DATE OF BIRTH:  Sep 03, 1990  DATE OF ADMISSION:  11/07/2012 DATE OF DISCHARGE:  11/08/2012  PRIMARY CARE PHYSICIAN: Nonlocal.   DISCHARGE DIAGNOSIS: Acute pancreatitis with cholelithiasis.   CONDITION: Stable.   CODE STATUS: Full code.   HOME MEDICATION: Pantoprazole 40 mg p.o. b.i.d. and then 1 tab once a day.   DIET: Low fat, low cholesterol diet.   ACTIVITY: As tolerated.   FOLLOWUP CARE: Follow up with PCP within 1 to 2.   CONSULTATION: OB/GYN, Dr. Patton Salles.   REASON FOR ADMISSION: Abdominal pain and intractable nausea and vomiting.   HOSPITAL COURSE: The patient is a 24 year old Caucasian female with 31-week pregnancy who came to ED with abdominal pain which was sharp, severe, a 10 out of 10, associated with vomiting. Her lipase was elevated at 1900, so she was admitted for pancreatitis. For detailed history and physical examination, please refer to the admission note dictated by Dr. Mordecai Maes. On admission date, the patient's glucose was 84, creatinine 0.73, lipase 1900 and bilirubin 1.1. WBC 13.7. Urinalysis negative. Ultrasound of right upper quadrant showed cholelithiasis without evidence of cholecystitis. The patient was admitted for acute pancreatitis. After admission, the patient was kept n.p.o. with IV fluid support. The patient's symptoms have much improved after treatment. The patient's lipase decreased to 114, and WBC decreased to 10.1. Dr. Patton Salles saw the patient. She thinks the patient is stable from [facility] standpoint. The patient tolerated lunch today, and she will be discharged to home today. I discussed the patient's discharge plan with the patient, nurse and case manager.   TIME SPENT: About 33 minutes.   ____________________________ Shaune Pollack, MD qc:OSi D: 11/08/2012 15:48:05 ET T: 11/09/2012 06:14:50 ET JOB#: [id number]  cc: Shaune Pollack, MD, <Dictator> Shaune Pollack MD ELECTRONICALLY SIGNED 11/09/2012 14:32

## 2014-07-14 NOTE — Op Note (Signed)
PATIENT NAME:  Emily Nash, PORTZ MR#:  [id number] DATE OF BIRTH:  1990-10-15  DATE OF PROCEDURE:  12/29/2012  PREOPERATIVE DIAGNOSIS:  Presumed abruption.  POSTOPERATIVE DIAGNOSIS:  Term liveborn infant.    ESTIMATED BLOOD LOSS:  1000 mL.  SURGEON:  Elliot Gurney, M.D.  FINDINGS:  Term liveborn infant with what appears to be an abruption.  DESCRIPTION OF PROCEDURE: The patient was taken to the operating room and placed in supine position. After adequate general endotracheal anesthesia was instilled, the patient was prepped and draped in the usual sterile fashion. Pfannenstiel skin incision was made approximately 3 fingerbreadths above the pubic symphysis and carried sharply down to the fascia. The fascia was nicked in the midline and the incision was extended in a superolateral manner. The muscle bellies midline was found and split open. The peritoneum was grasped. Bladder blade was placed. Bladder flap was created. Uterine incision was made. Infant was delivered. Cord was clamped and cut. Cord blood was obtained. Infant was handed to the awaiting pediatrician. Pitocin was started. Uterus was delivered and wrapped in a moist laparotomy sponge. The interior of the uterus was curetted with a moist lap sponge. There was some excessive bleeding and the placenta was seen to have abruption on it. The patient's uterus was closed with a running locked chromic suture and then a running embrocating suture. The bladder flap was tacked back up to the uterine incision. The uterus was placed back into the abdomen. The abdomen was cleared of clots. The muscle bellies were sewn together with a running Vicryl suture. The ON-Q trocars were placed. These were threaded under the rectus fascia. The incision was closed then with a running Vicryl suture. 4-0 Monocryl was placed along the skin edges. Steri-Strips were placed. Bandages were placed. ON-Q pain pump was primed, and the patient was taken to recovery after having  tolerated the procedure well.     ____________________________ Elliot Gurney, MD cck:dmm D: 01/11/2013 10:25:00 ET T: 01/11/2013 10:58:24 ET JOB#: [id number]  cc: Elliot Gurney, MD, <Dictator> Elliot Gurney MD ELECTRONICALLY SIGNED 01/13/2013 13:08 Elliot Gurney MD ELECTRONICALLY SIGNED 01/13/2013 13:10

## 2014-07-14 NOTE — Consult Note (Signed)
PATIENT NAME:  Emily, Nash MR#:  [id number] DATE OF BIRTH:  05-29-1990  DATE OF CONSULTATION:  10/06/2012  ADMITTING PHYSICIAN: Farrel Conners, CNM under Dr. Patton Salles CONSULTING PHYSICIAN:  Quentin Ore III, MD  CHIEF COMPLAINT AND BRIEF HISTORY: Emily Nash is a 24 year old woman seen in the labor and delivery observation with a 2-day history of abdominal pain. She is [redacted] weeks pregnant, currently followed by the [facility]. She had an uncomplicated delivery approximately 11 months ago. Following that delivery, she did have some significant biliary symptoms and has had several other episodes prior to this current episode.  At the present time, she had severe back and mid epigastric pain with mild radiation, 1 episode of vomiting and no nausea at the present time. She is has been tolerating pain with Demerol medication. She has had nothing to eat in the last several hours.   LABORATORY AND RADIOLOGICAL DATA:  Laboratory values revealed an elevated white blood cell count at 16,000. Lipase was 568. Bilirubin is normal.  Ultrasound revealed stones without evidence of any gallbladder wall thickening or bile duct obstruction.   PAST MEDICAL AND SURGICAL HISTORY: She has no significant previous history of abdominal problems, specifically no hepatitis, yellow jaundice, pancreatitis, peptic ulcer disease, previous diagnosis of gallbladder disease or diverticulitis. She has had previous abdominal surgery. She did have a vaginal delivery with her last child. No history of cardiac disease, hypertension, diabetes or thyroid disease.   CURRENT MEDICATIONS: Cyclobenzaprine 10 mg p.o. 3 times a day, Macrobid 100 mg twice a day for 7 days and vaginal gel p.r.n.   ALLERGIES:  She is not allergic to any medications.   SOCIAL HISTORY: She smokes cigarettes regularly and drinks alcohol only occasionally. She does not work outside her home.   REVIEW OF SYSTEMS: Otherwise unremarkable. A 10-point  review of systems was carried out with the patient and is otherwise insignificant.   PHYSICAL EXAMINATION:  GENERAL: She is an alert, pleasant woman.  VITAL SIGNS: Demonstrate a blood pressure of 98/62. Heart rate is 92 and regular. She is on a fetal telemetry. Temperature is 98.2.  HEENT: Reveals no scleral icterus. No pupillary abnormalities. No facial deformity.  NECK: Supple, nontender with no adenopathy. Midline trachea.  CHEST: Clear with no adventitious sounds.  She has normal pulmonary excursion.  CARDIAC: No murmurs or gallops. She seems to be in normal sinus rhythm.  ABDOMEN: Soft with some very mild mid epigastric tenderness. She has no rebound, guarding, or organomegaly. No hernias are noted.  LOWER EXTREMITIES:  Full range of motion, no deformities. No edema.  PSYCHIATRIC: Normal orientation, normal affect.  She does not complain of any significant pain at the present time.   ASSESSMENT AND PLAN: I discussed the situation with the patient and Ms. Sharen Hones, who is covering for her this evening. I do not see any significant evidence of acute cholelithiasis at the present time, and she does not have any symptoms that suggest urgent surgical intervention. We outlined the risks of surgery during her pregnancy.  We would certainly like to treat her with p.o. medications, both antibiotics and pain medicine, and see her tolerate a diet reasonably well and get her through to her delivery. If that were necessary or were to occur, then we would recommend surgery once the baby has been delivered.  She would be a good candidate for laparoscopic or robotic -assisted cholecystectomy. Should her symptoms worsen in the meantime or recur, then we would consider surgical intervention despite  the risk to the fetus. She is in her third trimester, so laparoscopy may be limited, and we discussed that possibility with the patient.  ____________________________ Carmie End, MD rle:cb D: 10/06/2012  21:50:18 ET T: 10/06/2012 22:58:28 ET JOB#: [id number]  cc: Quentin Ore III, MD, <Dictator> Quentin Ore MD ELECTRONICALLY SIGNED 10/07/2012 19:38

## 2014-07-14 NOTE — Consult Note (Signed)
PATIENT NAME:  Emily Nash, Emily Nash MR#:  [id number] DATE OF BIRTH:  Jul 06, 1990  DATE OF CONSULTATION:  11/17/2012  REFERRING PHYSICIAN:  Dr. Thomasene Nash of OB CONSULTING PHYSICIAN:  Emily Athens. Hower, MD  PRIMARY CARE PHYSICIAN:  Nonlocal.  REASON FOR CONSULTATION:  Recurrent pancreatitis.  HISTORY OF PRESENT ILLNESS:  Emily Nash is a 24 year old G58, P65-0-2-1 Caucasian female at 19 weeks with estimated due date of 01/05/2013 who is presenting with her third episode of nausea, vomiting, right upper quadrant pain secondary to pancreatitis.  She was admitted most recently approximately 1 week ago as well as one episode earlier on in her pregnancy back in July.  Her previous episodes were deemed likely secondary to gallstone pancreatitis.  She was evaluated by surgery who recommended cholecystectomy whenever able.  Once again, she was most recently discharged from [facility] on 11/08/2012 for pancreatitis.  Throughout that course her lipase was originally 1900.  She is now presenting with 1 day duration of intermittent right upper quadrant postprandial, however she is able to tolerate by mouth intake including fluids as well as food.  She is presenting with a 1 day duration of symptoms including a dull aching of the right upper quadrant with radiation to the back.  This is worsened with food intake.  She has found no relief.  She is rating the pain between a 6 and 7 out of 10 in intensity.  She has been evaluated by the OB service at this time who fortunately found no complications with the pregnancy.  Once again, we are being consulted for pancreatitis.    REVIEW OF SYSTEMS:   CONSTITUTIONAL:  She states she is in mild discomfort secondary to pain.   EYES:  She has no vision problems including visual acuity.  EARS, NOSE, THROAT, MOUTH:  She denies any oral ulcers and able to tolerate by mouth intake.  CARDIOVASCULAR:  Denies any chest pain or palpitations.  RESPIRATORY:  Denies any shortness of breath, cough,  wheeze. GASTROINTESTINAL:  She does attest to having nausea as well as right upper quadrant pain related to food, but denies any emesis.  She also mentions constipation.  The last bowel movement approximately 1 week ago.  GENITOURINARY:  Denies any symptoms of urinary frequency or dysuria.  MUSCULOSKELETAL:  Denies any aches or pains.   SKIN:  No rashes or new lesions.   NEUROLOGIC:  Denies any paralysis or paresthesia. PSYCHIATRIC:  She denies any suicidal or homicidal ideations. ENDOCRINE:  Denies any urinary frequency or other changes.  HEMATOLOGY AND LYMPHATIC:  Denies any lymphadenopathy or easy bleeding or bruisability. ALLERGY AND IMMUNOLOGY:  No active issues.  Otherwise, full review of systems performed is negative.   PAST MEDICAL HISTORY:  Significant for pancreatitis with 2 previous episodes as well as cholelithiasis.  This is her 4th pregnancy as she has had 2 miscarriages and 1 child at home, a 32 month boy as well as a history of gastroesophageal reflux disease.  Otherwise, she is relatively healthy.    ALLERGIES:  No known drug allergies.   SOCIAL HISTORY:  She smokes between 5 and 10 cigarettes daily and occasional alcohol intake, though not since pregnancy.    PAST SURGICAL HISTORY:  None.  FAMILY HISTORY:  Positive for lung cancer in a grandfather.  COPD in her mother.    HOME MEDICATIONS:  Says she does have a prescription for Protonix.  She is not actively taking this medication.    PHYSICAL EXAMINATION:  VITAL SIGNS:  Heart  rate 76, respirations 18, blood pressure 97/52, pulse ox 98, saturating well on room air.   GENERAL:  No acute distress, awake, alert, oriented x 3.   HEENT:  Normocephalic, atraumatic.  Extraocular muscles intact.  Pupils equal, round, reactive to light as well as accommodation.  Moist mucosal membranes.  No thyromegaly noted.  CARDIOVASCULAR:  S1, S2, regular rate and rhythm.  No murmurs, rubs or gallops.  PULMONARY:  Clear to auscultation  bilaterally without wheezes, rubs or rhonchi.   ABDOMEN:  As per gestation age.  Mild right upper quadrant tenderness to palpation without rebound, guarding or motion tenderness.  EXTREMITIES:  Reveal no cyanosis, edema or clubbing. NEUROLOGIC:  Cranial nerves II through XII intact with no gross focal neurological deficits.    LABORATORY DATA:  Sodium 136, potassium 3.7, chloride 105, bicarb of 24, BUN 8, creatinine 0.62, glucose 81, lipase of 1088.  Total protein 6.9, albumin 2.8, bilirubin 1.1, alkaline phosphatase 135, AST of 36, ALT 29, WBC of 12.5, hemoglobin 12.3, hematocrit 35.5, platelets 211.  Urinalysis cloudy urine with 3+ positive leukocyte esterase.  Her previous lipase on August 17th was 1907 which resolved to 114 upon discharge.    ASSESSMENT AND PLAN:  A 25 year old female at [redacted] weeks gestation presenting with recurrent right upper quadrant pain with recent diagnosis of pancreatitis in the setting of cholelithiasis.  She was discharged from [facility] on 11/08/2012 for pancreatitis.  At that time her lipase was 1907, which resolved to the low 100s after one day of IV fluids.  She has currently had one day duration of intermittent right upper quadrant pain which is postprandial.  She is presenting with continued symptoms.  Fortunately at this time she is able to tolerate by mouth intake including liquids as well as solids.  We are being consulted once again for recurrent pancreatitis. 1.  Pancreatitis secondary to gallstones.  This is a known problem as she has actually been evaluated by surgery in the past for this issue.  She ultimately will need cholecystectomy in the future.  In the meantime, recommended continuation of IV fluids so that she will be receiving  5-6 liters in the first 24 hours as well as pain control as needed with IV morphine or other agents.  Would recommend checking a repeat lipase in the morning.  2.  Asymptomatic bacteruria.  We will continue treatment with antibiotics per  the OB team. 3.  Constipation: would recommend starting Colace and then adding MiraLAX as required for constipation seeing as her last bowel movement was approximately 7 days ago as this may also be causing further issues with abdominal pain.  4.  A [redacted] week gestation.  Recommended continued care as per the primary service of OB.    Full code TOTAL CONSULTATION TIME:  37 minutes.   ____________________________ Emily Athens. Hower, MD dkh:ea D: 11/18/2012 01:05:00 ET T: 11/18/2012 03:21:12 ET JOB#: [id number]  cc: Emily Athens. Hower, MD, <Dictator> DAVID Synetta Shadow MD ELECTRONICALLY SIGNED 12/01/2012 22:02

## 2014-07-15 NOTE — Discharge Summary (Signed)
PATIENT NAME:  Emily Nash, Emily Nash MR#:  [id number] DATE OF BIRTH:  Aug 09, 1990  DATE OF ADMISSION:  12/15/2013 DATE OF DISCHARGE:  12/17/2013  FINAL DIAGNOSES:  1.  Symptomatic cholelithiasis. Biliary. 2.  Colic.   PRINCIPAL PROCEDURES: Laparoscopic cholecystectomy 12/16/2013.   HOSPITAL COURSE SUMMARY: The patient was admitted with recurrent biliary colic. She was on the schedule for elective laparoscopic outpatient cholecystectomy in approximately 10 to 14 days. She came to the Emergency Room with significant abdominal pain, was admitted to the surgical service under the care of Dr. Excell Seltzer. I then assumed her care the following morning. She was taken to the Operating Room where an uncomplicated laparoscopic cholecystectomy was performed. Postoperatively, she did very well. She was tolerating a regular diet with no nausea, no vomiting. Her dressings were dry. Her abdomen was soft and her pain was resolved. She was deemed suitable and stable and improved for discharge home with follow-up in the office in 7 to 14 days. Call with any questions to the office. Medication reconciliation form was performed.    ____________________________ Redge Gainer. Egbert Garibaldi, MD mab:at D: 12/17/2013 10:59:25 ET T: 12/17/2013 16:33:32 ET JOB#: [id number]  cc: Loraine Leriche A. Egbert Garibaldi, MD, <Dictator> Jasiah Elsen A Shaily Librizzi MD ELECTRONICALLY SIGNED 12/18/2013 8:14

## 2014-07-15 NOTE — H&P (Signed)
Subjective/Chief Complaint ruq pain   History of Present Illness 12 hrs pain, nausea no f/c sch for surgery next week with Dr Pat Patrick   Past History csection   Past Med/Surgical Hx:  Denies medical history:   c section:   Abortion:   ALLERGIES:  Morphine: Hives  Family and Social History:  Family History Non-Contributory   Social History negative tobacco, home with kids   Place of Living Home   Review of Systems:  Fever/Chills No   Cough No   Abdominal Pain Yes   Diarrhea No   Constipation No   Nausea/Vomiting Yes   SOB/DOE No   Dysuria No   Tolerating Diet No  Nauseated   Medications/Allergies Reviewed Medications/Allergies reviewed   Physical Exam:  GEN no acute distress   HEENT pink conjunctivae   NECK supple   RESP normal resp effort  clear BS   CARD regular rate   ABD positive tenderness  no hernia   LYMPH negative neck   EXTR negative edema   SKIN normal to palpation   PSYCH alert, A+O to time, place, person, good insight   Lab Results: Hepatic:  24-Sep-15 18:17   Bilirubin, Total 0.6  Alkaline Phosphatase 74 (46-116 NOTE: New Reference Range 10/11/13)  SGPT (ALT) 32 (14-63 NOTE: New Reference Range 10/11/13)  SGOT (AST) 31  Total Protein, Serum 8.2  Albumin, Serum 3.9  Routine Chem:  24-Sep-15 18:17   Glucose, Serum  113  BUN 10  Creatinine (comp) 1.06  Sodium, Serum 140  Potassium, Serum  3.4  Chloride, Serum 105  CO2, Serum 27  Calcium (Total), Serum 8.8  Osmolality (calc) 279  eGFR (African American) >60  eGFR (Non-African American) >60 (eGFR values <7m/min/1.73 m2 may be an indication of chronic kidney disease (CKD). Calculated eGFR, using the MRDR Study equation, is useful in  patients with stable renal function. The eGFR calculation will not be reliable in acutely ill patients when serum creatinine is changing rapidly. It is not useful in patients on dialysis. The eGFR calculation may not be applicable to  patients at the low and high extremes of body sizes, pregnant women, and vetetarians.)  Anion Gap 8  Lipase  398 (Result(s) reported on 15 Dec 2013 at 06:50PM.)  Routine Hem:  24-Sep-15 18:17   WBC (CBC)  12.9  RBC (CBC) 4.99  Hemoglobin (CBC) 14.6  Hematocrit (CBC) 43.7  Platelet Count (CBC) 289  MCV 88  MCH 29.3  MCHC 33.4  RDW 12.9  Neutrophil % 81.4  Lymphocyte % 14.0  Monocyte % 3.7  Eosinophil % 0.3  Basophil % 0.6  Neutrophil #  10.5  Lymphocyte # 1.8  Monocyte # 0.5  Eosinophil # 0.0  Basophil # 0.1 (Result(s) reported on 15 Dec 2013 at 06:37PM.)   Radiology Results: [place]    09-Sep-15 07:42, [place]  [place]  REASON FOR EXAM:    GB disease eval cholecystitis  COMMENTS:   Body Site: Gallbladder, Liver, Common Bile Duct    PROCEDURE: [place] - [place] SURVE[place]  - Nov 30 2013  7:42AM     CLINICAL DATA:  Right upper quadrant pain common known  cholelithiasis    EXAM:  [place] - RIGHT UPPER QUADRANT    COMPARISON:  11/12/2013    FINDINGS:  Gallbladder:  Well distended with evidence of cholelithiasis stable from the prior  study. No pericholecystic fluid is seen.    Common bile duct:  Diameter: 5 mm.    Liver:    No focal lesion identified. Within normal limits in parenchymal  echogenicity.     IMPRESSION:  Cholelithiasis without complicating factors.  Electronically Signed    By: Inez Catalina M.D.    On: 11/30/2013 07:50         Verified By: Everlene Farrier, M.D.,    Assessment/Admission Diagnosis lap chole for symptoms admit hydrate risks rev'd in detail   Electronic Signatures: Florene Glen (MD)  (Signed 24-Sep-15 19:42)  Authored: CHIEF COMPLAINT and HISTORY, PAST MEDICAL/SURGIAL HISTORY, ALLERGIES, FAMILY AND SOCIAL HISTORY, REVIEW OF SYSTEMS, PHYSICAL EXAM, LABS, Radiology, ASSESSMENT AND PLAN   Last Updated: 24-Sep-15 19:42 by Florene Glen (MD)

## 2014-07-15 NOTE — Op Note (Signed)
PATIENT NAME:  Emily Nash, Emily Nash MR#:  [id number] DATE OF BIRTH:  [birth year]  DATE OF PROCEDURE:  12/16/2013  PREOPERATIVE DIAGNOSIS: Cholelithiasis and biliary colic.   POSTOPERATIVE DIAGNOSIS:  Cholelithiasis and biliary colic.   PROCEDURE PERFORMED: Laparoscopic cholecystectomy.   SURGEON: Natale Lay, MD.   ASSISTANT: None.   ANESTHESIA: General endotracheal.   FINDINGS: Cholelithiasis.   SPECIMENS: Gallbladder with contents.   ESTIMATED BLOOD LOSS: Minimal.   DESCRIPTION OF PROCEDURE: With informed consent in the supine position, general oroendotracheal anesthesia was induced. The patient's abdomen was widely clipped of hair, prepped and draped with ChloraPrep solution. Left arm was padded and tucked at her side. Timeout was observed. A 12 mm blunt Hassan trocar was placed through an open technique through an infraumbilical strip transversely oriented skin incision, stay sutures being passed through the fascia. Pneumoperitoneum was established. The patient was then positioned in reverse Trendelenburg and airplanes right side up. A 5 mm bladeless trocar was placed in the epigastric region. Two 5 mm first assistant ports in the right subcostal margin. The gallbladder was grasped along its fundus and elevated towards the right shoulder and laterally. Lateral traction was achieved on Hartman pouch by taking down omental adhesions to the body of the gallbladder with blunt technique and no energy. The hepatoduodenal ligament was then dissected with a combination of hook cautery through the transparent fatty layer and blunt technique utilizing the [place] forceps and a critical view of safety cholecystectomy was achieved. Cystic duct was triply clipped on the portal side, singly clipped on the gallbladder side and divided. The cystic artery was likewise critically identified, doubly clipped on the portal side, singly clipped on the gallbladder side and divided. The gallbladder was then retrieved off  the gallbladder fossa utilizing hook cautery apparatus, placed into an Endo Catch device and retrieved. During the retrieval process the 5 mm camera was placed in the epigastric port demonstrating no evidence of bowel injury with trocar insertion at the umbilicus. Pneumoperitoneum was then re-established. Hemostasis was obtained on the operative field and the gallbladder fossa as well as in the middle of the upper trocar sites with point cautery and the peritoneal site.    Ports were then removed under direct visualization. Pneumoperitoneum was released. The infraumbilical fascial defect was reapproximated with an additional figure-of-8 number 0 Vicryl suture in vertical orientation, the existing stay sutures tied to each other. A total of 20 mL of 0.25% plain Marcaine was infiltrated along all skin and fascial incisions prior to closure. 4-0 Vicryl subcuticular was applied to all skin edges followed by the application of benzoin, Steri-Strips, Telfa, and Tegaderm. The patient was then subsequently extubated and taken to the recovery room in stable and satisfactory condition by anesthesia services.     ____________________________ Redge Gainer Egbert Garibaldi, MD mab:bu D: 12/16/2013 18:06:01 ET T: 12/16/2013 20:29:41 ET JOB#: [id number]  cc: Loraine Leriche A. Egbert Garibaldi, MD, <Dictator> Raynald Kemp MD ELECTRONICALLY SIGNED 12/17/2013 16:45

## 2014-08-01 NOTE — H&P (Signed)
L&D Evaluation:  History:   HPI 24 yo G3P0020 at [redacted]w[redacted]d gestation based on 8wk [place] derived EDC of 01/06/2012 presenting with concern of ROM.  Patient presents after noting continued leakage of fluid today.  No big gush.  +FM, no VB, no ctx.  Extremely anxious about exam and first two pressure mild range.  Denies HA, vision changes, RUQ/epigastric pain, increased edema.   PNC at [facility] unremarkable to date.  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  O pos / ABSC neg / RI / VZI / HBsAg neg / HIV neg / RPR NR / pap wnl    Presents with leaking fluid    Patient's Medical History HSV    Patient's Surgical History none    Medications Pre Natal Vitamins    Allergies NKDA    Social History none    Family History Non-Contributory   ROS:   ROS All systems were reviewed.  HEENT, CNS, GI, GU, Respiratory, CV, Renal and Musculoskeletal systems were found to be normal.   Exam:   Vital Signs BP >140/90    Urine Protein not completed, Pr/Cr ratio pending    General no apparent distress    Mental Status clear    Chest clear    Heart normal sinus rhythm    Abdomen gravid, non-tender    Estimated Fetal Weight Average for gestational age    Fetal Position vtx    Edema no edema    Pelvic no external lesions, 1/50/-3    Mebranes Intact, no pooling, negative nitrazine, negative ferning    FHT normal rate with no decels    Ucx absent   Impression:   Impression R/O SROM and PIH work up   Plan:   Comments 1) SROM - no evidence of SROM based on no pooling, negative nitrazine, negative ferning.   2) Elevated BP - suspect anxiety related will cycle BP's send PIH panel with urine Pr/Cr ratio 2) If normotensive and negative PIH panel will discharge home, IOL scheduled 10/21   Electronic Signatures for Addendum Section:  Lorrene Reid (MD) (Signed Addendum 19-Oct-13  23:29)  Patient has been taking her valtrex as directed.  PIH panel and Pr/Cr ratio returned negative.  The remainder of the patient's blood pressures were normotensive.  Discharge home with routine labor precautions.   Electronic Signatures: Lorrene Reid (MD)  (Signed 19-Oct-13 23:21)  Authored: L&D Evaluation   Last Updated: 19-Oct-13 23:29 by Lorrene Reid (MD)

## 2014-08-01 NOTE — H&P (Signed)
L&D Evaluation:  History:   HPI 24 yo G3P0020 at [redacted]w[redacted]d gestation based on 8wk [place] derived EDC of 01/06/2012 presenting with concern of ROM.  Patient presents after going to pool today, after getting out of the pool she noticed a gush of fluid, has felt wet thereafter.  She denies VB, ctx, positive fetal movement.  Of note the patient does report unprotected intercourse 1 week ago with someone other than FOB.  PNC at [facility] unremarkable to date.  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  O pos / ABSC neg / RI / VZI / HBsAg neg / HIV neg / RPR NR / pap wnl    Presents with leaking fluid    Patient's Medical History HSV    Patient's Surgical History none    Medications Pre Natal Vitamins    Allergies NKDA    Social History none    Family History Non-Contributory   ROS:   ROS All systems were reviewed.  HEENT, CNS, GI, GU, Respiratory, CV, Renal and Musculoskeletal systems were found to be normal.   Exam:   Vital Signs stable    Urine Protein not completed    General no apparent distress    Abdomen gravid, non-tender    Estimated Fetal Weight Average for gestational age    Edema no edema    Pelvic no external lesions, cervix closed and thick    Mebranes Intact, no pooling, negative nitrazine, negative ferning    FHT normal rate with no decels    Ucx absent   Impression:   Impression No evidence of PPROM   Plan:   Comments 1) PPROM - no evidence of PPROM based on no pooling, negative nitrazine, negative ferning.  High risk of STD's with history of HSV, trichomonas, and gonorrhea this pregnancy.  GC/CT obtained.  Counseled on safe sex practices 2) Dischage home follow up 10/14/2011   Electronic Signatures: Lorrene Reid (MD)  (Signed 09-Jul-13 23:59)  Authored: L&D Evaluation   Last Updated: 09-Jul-13 23:59 by Lorrene Reid (MD)

## 2014-08-01 NOTE — H&P (Signed)
L&D Evaluation:  History Expanded:  HPI 24 yo G4P1021 WF at 33 weeks with estimated date of confinement 01/05/13, presents with third episode of n/v/pain from pancreatitis.  Admitteed last week and once in July.  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  She states that since her most recent discharge she has been maintaining her diet as recommended at her most recent discharge.  She started having pain at about 2pm today with associated nausea/vomiting.  Her last BM was during her last hospitalization.  She denies fevers, she has had chills during episodes of nausea.  She denies chest pain, shortness of breath, and any urinary symptoms. She notes positive fetal movement, no leakage of fluid, no vaginal bleeding.  She notes no contractions.  Patient has received Prenatal Care at [facility]. Prior Normal Spontaneous Vaginal Delivery 9 mos ago.  Preg complicated by close interval pregnancy, h/o macrosomia, and h/o cervical dysplasia.   Gravida 4   Term 1   PreTerm 0   Abortion 2   Living 1   Blood Type (Maternal) O positive   Group B Strep Results Maternal (Result >5wks must be treated as unknown) unknown/result > 5 weeks ago   Patient's Medical History Pancreatitis, Cervical dysplasia   Patient's Surgical History none   Medications None   Allergies NKDA   Social History quit smoking one week ago   Family History Non-Contributory   ROS:  ROS All systems were reviewed.  HEENT, CNS, GI, GU, Respiratory, CV, Renal and Musculoskeletal systems were found to be normal.   Exam:  Vital Signs T 36.9C (98.71F), P100, BP 114/60   General no apparent distress   Mental Status clear   Chest clear   Heart normal sinus rhythm   Abdomen gravid, uterus NT, ND, RUQ tender to palpation   Estimated Fetal Weight Average for gestational age   Back no CVAT   Edema no edema   Mebranes Intact   FHT Description 120/mod var/+accels/no decels   Ucx absent   Skin no  lesions   Other RUQ U/S 11/17/12 Findings: Visualized portions of the liver demonstrate normal echogenicity and  normal contours. The liver is without evidence of a focal hepatic lesion.  There are cholelithiasis. There is no intra- or extrahepatic biliary  ductal dilatation. The common duct measures 4.3 mm in maximal diameter.  There is no gallbladder wall thickening, pericholecystic fluid, or  sonographic Murphy's sign.   The pancreas is not visualized.  IMPRESSION:  Cholelithiasis without sonographic evidence of acute cholecystitis.   Impression:  Impression 1) Intrauterine pregnancy at [redacted] weeks gestational age, 2) Acute pancreatitis   Plan:  Comments Acute Pancreatitis:  - Consult General medicine for consideration of admission to their service as no current obstetrical issues - possible UTI: has been treated with ceftriaxone.  Send urine culture. - NPO, IV hydration, IV analgesia - monitor vitals closely  Fetal well being: NST reactive.   - Recommend daily fetal doppler tones.   - may get daily non-stress test, if able.   Labs:  Lab Results:  Hepatic:  27-Aug-14 15:36   Bilirubin, Total  1.1  Alkaline Phosphatase 135  SGPT (ALT) 29  SGOT (AST) 36  Total Protein, Serum 6.9  Albumin, Serum  2.8  Routine Chem:  27-Aug-14 15:36   HCG Betasubunit Quant. Serum  3098 (1-3  (International Unit)  ----------------- Non-pregnant <5 Weeks Post LMP mIU/mL  3- 4 wk 9 - 130  4- 5 wk 75 - 2,600  5- 6  wk 850 - 20,800  6- 7 wk 4,000 - 100,000  7-12 wk 11,500 - 289,000 12-16 wk 18,000 - 137,000 16-29 wk 1,400 - 53,000 29-41 wk 940 - 60,000)  Lipase  1088 (Result(s) reported on 17 Nov 2012 at 04:19PM.)  Glucose, Serum 81  BUN 8  Creatinine (comp) 0.62  Sodium, Serum 136  Potassium, Serum 3.7  Chloride, Serum 105  CO2, Serum 24  Calcium (Total), Serum 8.6  Osmolality (calc) 269  eGFR (African American) >60  eGFR (Non-African American) >60 (eGFR values <63m/min/1.73  m2 may be an indication of chronic kidney disease (CKD). Calculated eGFR is useful in patients with stable renal function. The eGFR calculation will not be reliable in acutely ill patients when serum creatinine is changing rapidly. It is not useful in  patients on dialysis. The eGFR calculation may not be applicable to patients at the low and high extremes of body sizes, pregnant women, and vegetarians.)  Anion Gap 7  Routine UA:  27-Aug-14 15:36   Color (UA) Amber  Clarity (UA) Cloudy  Glucose (UA) Negative  Bilirubin (UA) 1+  Ketones (UA) Negative  Specific Gravity (UA) 1.018  Blood (UA) Negative  pH (UA) 6.0  Protein (UA) 30 mg/dL  Nitrite (UA) Negative  Leukocyte Esterase (UA) 3+ (Result(s) reported on 17 Nov 2012 at 04:46PM.)  RBC (UA) 39 /HPF  WBC (UA) 29 /HPF  Bacteria (UA) TRACE  Epithelial Cells (UA) 93 /HPF (Result(s) reported on 17 Nov 2012 at 04:46PM.)  Routine Hem:  27-Aug-14 15:36   WBC (CBC)  12.5  RBC (CBC) 4.09  Hemoglobin (CBC) 12.3  Hematocrit (CBC) 35.5  Platelet Count (CBC) 211  MCV 87  MCH 30.1  MCHC 34.7  RDW 13.2  Neutrophil % 83.3  Lymphocyte % 10.7  Monocyte % 5.4  Eosinophil % 0.2  Basophil % 0.4  Neutrophil #  10.4  Lymphocyte # 1.3  Monocyte # 0.7  Eosinophil # 0.0  Basophil # 0.1 (Result(s) reported on 17 Nov 2012 at 04:19PM.)   Electronic Signatures: JWill Bonnet(MD)  (Signed 27-Aug-14 22:07)  Authored: L&D Evaluation, Labs   Last Updated: 27-Aug-14 22:07 by JWill Bonnet(MD)

## 2014-08-01 NOTE — H&P (Signed)
L&D Evaluation:  History Expanded:  HPI 24 yo [id number] WF at 32 weeks w estimated date of confinement 01/05/13, presents w second episode of n/v/pain from pancreatitis, admitted by Medicine for treatment. Patient has received Prenatal Care at [facility]. Prior Normal Spontaneous Vaginal Delivery 9 mos ago.  Preg complicated by close interval pregnancy, h/o macrosomia, and h/o cervical dysplasia. O +.   Gravida 4   Term 1   PreTerm 0   Abortion 2   Living 1   Blood Type (Maternal) O positive   Group B Strep Results Maternal (Result >5wks must be treated as unknown) unknown/result > 5 weeks ago   Patient's Medical History Pancreatitis, Cervical dysplasia   Patient's Surgical History none   Medications Pre Natal Vitamins   Allergies NKDA   Social History tobacco   Family History Non-Contributory   ROS:  ROS All systems were reviewed.  HEENT, CNS, GI, GU, Respiratory, CV, Renal and Musculoskeletal systems were found to be normal.   Exam:  Vital Signs stable   General no apparent distress   Mental Status clear   Abdomen gravid, min T, ND   Estimated Fetal Weight Average for gestational age   Edema no edema   Impression:  Impression CONSULT- Pregnancy, 32 weeks, Pancreatitis.   Plan:  Plan Daily fetal heart rate checks.   Comments Monitor hydration staus. Analgesia as needed.   Risks to pregnancy discussed.   Electronic Signatures: Letitia Libra (MD)  (Signed 17-Aug-14 19:25)  Authored: L&D Evaluation   Last Updated: 17-Aug-14 19:25 by Letitia Libra (MD)

## 2014-08-01 NOTE — H&P (Signed)
L&D Evaluation:  History:  HPI 24 year old G$ P65 with EDC=10/15 2014 by an LMP=03/31/2012 presents at 27 weeks with c/o RUQ pain radiating into back accompanied by N/V. First time she experienced this pain was after the birth of her first baby last oct 2013. Had another episode of similar pain prior to this current pregnancy.. This current episode began last night after eating spagetti and salad and the pain waxed and waned all night, but started to get much worse around 1200 today. Last ate today at 1030-2 packs of instant oatmeal. Had a soft green BM today. No diarrhea, fever, or chills. Last vomited around 2230 last night. Tylenol did not help pain. Taking a deep breath makes pain worse. Difficult getting into a comfortable position last night. 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 R/O GB disease   Plan:  Plan IV hydration and IV analgesics, NPO. CMP, lipase, CBC.  ABdominal ultrasound   Electronic Signatures: Trinna Balloon (CNM)  (Signed 17-Jul-14 09:52)  Authored: L&D Evaluation   Last Updated: 17-Jul-14 09:52 by Trinna Balloon (CNM)

## 2014-08-01 NOTE — H&P (Signed)
L&D Evaluation:  History Expanded:  HPI 24 yo G4P1021 previous macrocomic infant 11-3oz, who walks in to l and d today with sudden onset severe back pain and point tenderness on the uterus. it started at 630 am. no bleeding upon entry to l and d but fetal heart rate 50-60 going up to 90 and then 110 with some accels to 120, pt was taken back to or stat.   Gravida 4   Term 1   PreTerm 0   Abortion 2   Living 1   Group B Strep Results Maternal (Result >5wks must be treated as unknown) negative   Maternal HIV Negative   Maternal Syphilis Ab Nonreactive   Maternal T-Dap Unknown   [facility] 05-Jan-2013   Presents with abdominal pain, decreased fetal movement   Patient's Medical History No Chronic Illness   Patient's Surgical History none   Medications Pre Natal Vitamins   Allergies NKDA   Social History none   Family History Non-Contributory   ROS:  ROS All systems were reviewed.  HEENT, CNS, GI, GU, Respiratory, CV, Renal and Musculoskeletal systems were found to be normal.   Exam:  Vital Signs stable   Urine Protein not completed   General no apparent distress   Mental Status clear   Chest clear   Heart normal sinus rhythm   Abdomen tender without contractions   Estimated Fetal Weight Small for gestational age   Fetal Position vertex   Fundal Height term   Back no CVAT   Edema no edema   Pelvic no external lesions   Mebranes Intact   FHT fetal heartv rate 50-60 for two minutes   FHT Description Bradycardia   Ucx irregular   Length of each Contraction 10 seconds   Skin dry   Lymph no lymphadenopathy   Impression:  Impression probable abruption   Plan:  Plan stat cserction   Follow Up Appointment need to schedule. in 6 weeks   Electronic Signatures: Adria Devon (MD)  (Signed 08-Oct-14 15:42)  Authored: L&D Evaluation   Last Updated: 08-Oct-14 15:42 by Adria Devon (MD)

## 2014-10-28 ENCOUNTER — Encounter: Payer: Self-pay | Admitting: Emergency Medicine

## 2014-10-28 ENCOUNTER — Emergency Department
Admission: EM | Admit: 2014-10-28 | Discharge: 2014-10-28 | Disposition: A | Discharge status: Home / Self Care | Payer: Self-pay | Attending: Emergency Medicine | Admitting: Emergency Medicine

## 2014-10-28 DIAGNOSIS — Z3202 Encounter for pregnancy test, result negative: Secondary | ICD-10-CM | POA: Insufficient documentation

## 2014-10-28 DIAGNOSIS — Z87891 Personal history of nicotine dependence: Secondary | ICD-10-CM | POA: Insufficient documentation

## 2014-10-28 DIAGNOSIS — K529 Noninfective gastroenteritis and colitis, unspecified: Secondary | ICD-10-CM | POA: Insufficient documentation

## 2014-10-28 DIAGNOSIS — J029 Acute pharyngitis, unspecified: Secondary | ICD-10-CM | POA: Insufficient documentation

## 2014-10-28 LAB — COMPREHENSIVE METABOLIC PANEL
ALBUMIN: 4.6 g/dL (ref 3.5–5.0)
ALT: 16 U/L (ref 14–54)
AST: 20 U/L (ref 15–41)
Alkaline Phosphatase: 65 U/L (ref 38–126)
Anion gap: 9 (ref 5–15)
BUN: 12 mg/dL (ref 6–20)
CHLORIDE: 107 mmol/L (ref 101–111)
CO2: 25 mmol/L (ref 22–32)
Calcium: 9.3 mg/dL (ref 8.9–10.3)
Creatinine, Ser: 0.77 mg/dL (ref 0.44–1.00)
GFR calc Af Amer: >60 mL/min (ref >60)
GFR calc non Af Amer: >60 mL/min (ref >60)
GLUCOSE: 99 mg/dL (ref 65–99)
Potassium: 3.8 mmol/L (ref 3.5–5.1)
Sodium: 141 mmol/L (ref 135–145)
TOTAL PROTEIN: 8.3 g/dL — AB (ref 6.5–8.1)
Total Bilirubin: 0.5 mg/dL (ref 0.3–1.2)

## 2014-10-28 LAB — URINALYSIS COMPLETE WITH MICROSCOPIC (ARMC ONLY)
Bilirubin Urine: NEGATIVE
Glucose, UA: NEGATIVE mg/dL
Hgb urine dipstick: NEGATIVE
Nitrite: NEGATIVE
Protein, ur: NEGATIVE mg/dL
Specific Gravity, Urine: 1.031 — ABNORMAL HIGH (ref 1.005–1.030)
pH: 5 (ref 5.0–8.0)

## 2014-10-28 LAB — CBC
HEMATOCRIT: 45.5 % (ref 35.0–47.0)
HEMOGLOBIN: 15.6 g/dL (ref 12.0–16.0)
MCH: 29.7 pg (ref 26.0–34.0)
MCHC: 34.4 g/dL (ref 32.0–36.0)
MCV: 86.5 fL (ref 80.0–100.0)
Platelets: 274 10*3/uL (ref 150–440)
RBC: 5.26 MIL/uL — AB (ref 3.80–5.20)
RDW: 12.8 % (ref 11.5–14.5)
WBC: 14.8 10*3/uL — ABNORMAL HIGH (ref 3.6–11.0)

## 2014-10-28 LAB — POCT PREGNANCY, URINE: PREG TEST UR: NEGATIVE

## 2014-10-28 LAB — LIPASE, BLOOD: Lipase: 15 U/L — ABNORMAL LOW (ref 22–51)

## 2014-10-28 MED ORDER — ONDANSETRON HCL 4 MG PO TABS: 4.0000 mg | ORAL_TABLET | Freq: Three times a day (TID) | ORAL | Status: AC | PRN

## 2014-10-28 MED ORDER — ONDANSETRON HCL 4 MG/2ML IJ SOLN
4.0000 mg | Freq: Once | INTRAMUSCULAR | Status: AC
  Administered 2014-10-28: 4 mg via INTRAVENOUS
  Filled 2014-10-28: qty 2

## 2014-10-28 MED ORDER — HYDROCODONE-ACETAMINOPHEN 5-325 MG PO TABS
1.0000 | ORAL_TABLET | Freq: Once | ORAL | Status: AC
  Administered 2014-10-28: 1 via ORAL
  Filled 2014-10-28: qty 1

## 2014-10-28 MED ORDER — HYDROCODONE-ACETAMINOPHEN 5-325 MG PO TABS: 1.0000 | ORAL_TABLET | Freq: Four times a day (QID) | ORAL | Status: AC | PRN

## 2014-10-28 NOTE — Discharge Instructions (Signed)
Viral Gastroenteritis °Viral gastroenteritis is also known as stomach flu. This condition affects the stomach and intestinal tract. It can cause sudden diarrhea and vomiting. The illness typically lasts 3 to 8 days. Most people develop an immune response that eventually gets rid of the virus. While this natural response develops, the virus can make you quite ill. °CAUSES  °Many different viruses can cause gastroenteritis, such as rotavirus or noroviruses. You can catch one of these viruses by consuming contaminated food or water. You may also catch a virus by sharing utensils or other personal items with an infected person or by touching a contaminated surface. °SYMPTOMS  °The most common symptoms are diarrhea and vomiting. These problems can cause a severe loss of body fluids (dehydration) and a body salt (electrolyte) imbalance. Other symptoms may include: °· Fever. °· Headache. °· Fatigue. °· Abdominal pain. °DIAGNOSIS  °Your caregiver can usually diagnose viral gastroenteritis based on your symptoms and a physical exam. A stool sample may also be taken to test for the presence of viruses or other infections. °TREATMENT  °This illness typically goes away on its own. Treatments are aimed at rehydration. The most serious cases of viral gastroenteritis involve vomiting so severely that you are not able to keep fluids down. In these cases, fluids must be given through an intravenous line (IV). °HOME CARE INSTRUCTIONS  °· Drink enough fluids to keep your urine clear or pale yellow. Drink small amounts of fluids frequently and increase the amounts as tolerated. °· Ask your caregiver for specific rehydration instructions. °· Avoid: °¨ Foods high in sugar. °¨ Alcohol. °¨ Carbonated drinks. °¨ Tobacco. °¨ Juice. °¨ Caffeine drinks. °¨ Extremely hot or cold fluids. °¨ Fatty, greasy foods. °¨ Too much intake of anything at one time. °¨ Dairy products until 24 to 48 hours after diarrhea stops. °· You may consume probiotics.  Probiotics are active cultures of beneficial bacteria. They may lessen the amount and number of diarrheal stools in adults. Probiotics can be found in yogurt with active cultures and in supplements. °· Wash your hands well to avoid spreading the virus. °· Only take over-the-counter or prescription medicines for pain, discomfort, or fever as directed by your caregiver. Do not give aspirin to children. Antidiarrheal medicines are not recommended. °· Ask your caregiver if you should continue to take your regular prescribed and over-the-counter medicines. °· Keep all follow-up appointments as directed by your caregiver. °SEEK IMMEDIATE MEDICAL CARE IF:  °· You are unable to keep fluids down. °· You do not urinate at least once every 6 to 8 hours. °· You develop shortness of breath. °· You notice blood in your stool or vomit. This may look like coffee grounds. °· You have abdominal pain that increases or is concentrated in one small area (localized). °· You have persistent vomiting or diarrhea. °· You have a fever. °· The patient is a child younger than 3 months, and he or she has a fever. °· The patient is a child older than 3 months, and he or she has a fever and persistent symptoms. °· The patient is a child older than 3 months, and he or she has a fever and symptoms suddenly get worse. °· The patient is a baby, and he or she has no tears when crying. °MAKE SURE YOU:  °· Understand these instructions. °· Will watch your condition. °· Will get help right away if you are not doing well or get worse. °Document Released: 03/10/2005 Document Revised: 06/02/2011 Document Reviewed: 12/25/2010 °  [employer] Patient Information 2015 [place], [place]. This information is not intended to replace advice given to you by your health care provider. Make sure you discuss any questions you have with your health care provider.   Please contact your primary physician for further outpatient follow-up. Drink plenty of fluids and advance her  diet as tolerated. Return to the emergency department especially for fever, bloody diarrhea, increased abdominal pain or lower abdominal discomfort.

## 2014-10-28 NOTE — ED Provider Notes (Signed)
[facility] Emergency Department Provider Note  ____________________________________________  Time seen: ----------------------------------------- 7:16 PM on 10/28/2014 -----------------------------------------    I have reviewed the triage vital signs and the nursing notes.   HISTORY  Chief Complaint Abdominal Pain    HPI Emily Nash is a 24 y.o. female who states that she had sudden onset of epigastric abdominal pain after an episode of nausea and vomiting and loose stool. She had a meal at [employer] last night and doesn't feel that is probably a foodborne though she started developing some nausea and vomiting and loose stool. She describes it as watery diarrhea. She has a headache on the left side without any photophobia or neck pain. He denies any radiation of pain and points primarily to the epigastric area and her main concern is that she's not been able to keep any food or fluid down on any consistent basis. She denies any lower abdominal pain     History reviewed. No pertinent past medical history.  There are no active problems to display for this patient.   Past Surgical History  Procedure Laterality Date  . Cholecystectomy    . Cesarean section      No current outpatient prescriptions on file.  Allergies Review of patient's allergies indicates no known allergies.  No family history on file.  Social History History  Substance Use Topics  . Smoking status: Former Games developer  . Smokeless tobacco: Not on file  . Alcohol Use: Not on file    Review of Systems  Constitutional: Negative for fever. Eyes: Negative for visual changes. ENT: Patient states a mild sore throat and states she has a bile taste in her mouth. Cardiovascular: Negative for chest pain. Respiratory: Negative for shortness of breath. Gastrointestinal: No abdominal pain or flank discomfort. Genitourinary: Negative for dysuria. Musculoskeletal: Negative for back  pain. Skin: Negative for rash. Neurological: Negative for headaches or focal weakness Psychiatric: No apparent distress    ____________________________________________   PHYSICAL EXAM:  VITAL SIGNS: ED Triage Vitals  Enc Vitals Group     BP 10/28/14 1843 97/65 mmHg     Pulse Rate 10/28/14 1843 113     Resp 10/28/14 1843 18     Temp 10/28/14 1843 98.5 F (36.9 C)     Temp Source 10/28/14 1843 Oral     SpO2 10/28/14 1843 98 %     Weight 10/28/14 1843 185 lb (83.915 kg)     Height 10/28/14 1843  (1.575 m)     Head Cir --      Peak Flow --      Pain Score 10/28/14 1844 8     Pain Loc --      Pain Edu? --      Excl. in GC? --      Constitutional: Alert and oriented. Well appearing and in no distress. Eyes: Conjunctivae are normal.  ENT   Head: Normocephalic and atraumatic.   Mouth/Throat: Mucous membranes are moist. Cardiovascular: Normal rate, regular rhythm. Normal and symmetric distal pulses are present in all extremities. No murmurs, rubs, or gallops. Respiratory: Normal respiratory effort without tachypnea nor retractions. Breath sounds are clear and equal bilaterally.  Gastrointestinal: Mildly obese with tenderness over the epigastric area without any peritoneal signs. Negative Murphy's sign noted focal tenderness over McBurney's point. No distention. There is no CVA tenderness. Genitourinary: deferred Musculoskeletal: Nontender with normal range of motion in all extremities. No lower extremity tenderness nor edema. Neurologic:  Normal speech and language.  No gross focal neurologic deficits are appreciated. Skin:  Skin is warm, dry and intact. No rash noted. Psychiatric: Mood and affect are normal. Patient exhibits appropriate insight and judgment.  ____________________________________________    LABS (pertinent positives/negatives)  Labs Reviewed  LIPASE, BLOOD  COMPREHENSIVE METABOLIC PANEL  CBC  URINALYSIS COMPLETEWITH MICROSCOPIC ([facility] ONLY)   POC URINE PREG, ED    __All laboratory work was reviewed and appeared to be within normal limits __________________________________________    INITIAL IMPRESSION / ASSESSMENT AND PLAN / ED COURSE  Pertinent labs & imaging results that were available during my care of the patient were reviewed by me and considered in my medical decision making (see chart for details). ED course: The patient's stay here was uneventful. Patient felt symptomatically improved after Zofran and Vicodin and I felt she most likely had some form of gastroenteritis whether it is viral or food borne. Her white blood cell count is elevated though this would be consistent with a viral illness. She is afebrile and does not have any melena or hematochezia Is unlikely that she had invasive diarrhea at this time. Her pain is all above the umbilicus making appendicitis unlikely. She was able tolerate food and fluid and states she feels better except for the headache that she had the left side of her head. Advised if this got worse that she can always return emergency Department but that he was likely to be a constitutional headache based on her nausea vomiting and diarrhea.  ____________________________________________   FINAL CLINICAL IMPRESSION(S) / ED DIAGNOSES Gastroenteritis Final diagnoses:  None     Jennye Moccasin, MD 10/28/14 2146

## 2014-10-28 NOTE — ED Notes (Signed)
Provided pt with ginger ale and cup of ice. Pt tolerated fluids well. Pt states nausea sx have resided. Pt is calm and NAD noted.

## 2014-10-28 NOTE — ED Notes (Signed)
Patient with no complaints at this time. Respirations even and unlabored. Skin warm/dry. Discharge instructions reviewed with patient at this time. Patient given opportunity to voice concerns/ask questions. IV removed per policy and band-aid applied to site. Patient discharged at this time and left Emergency Department with steady gait.  

## 2014-10-28 NOTE — ED Notes (Signed)
N/V/D awoke last night with symptoms, had Emily Nash prior to going to bed,  Since have constant epigastric abd pain with green bile, headache,

## 2016-02-08 ENCOUNTER — Emergency Department: Admission: EM | Admit: 2016-02-08 | Discharge: 2016-02-08 | Discharge status: Against Medical Advice | Payer: Self-pay

## 2016-02-09 ENCOUNTER — Encounter: Payer: Self-pay | Admitting: *Deleted

## 2016-02-09 ENCOUNTER — Emergency Department
Admission: EM | Admit: 2016-02-09 | Discharge: 2016-02-09 | Disposition: A | Discharge status: Home / Self Care | Payer: Self-pay | Attending: Emergency Medicine | Admitting: Emergency Medicine

## 2016-02-09 ENCOUNTER — Emergency Department: Payer: Self-pay

## 2016-02-09 DIAGNOSIS — Z87891 Personal history of nicotine dependence: Secondary | ICD-10-CM | POA: Insufficient documentation

## 2016-02-09 DIAGNOSIS — J209 Acute bronchitis, unspecified: Secondary | ICD-10-CM | POA: Insufficient documentation

## 2016-02-09 LAB — POCT RAPID STREP A: STREPTOCOCCUS, GROUP A SCREEN (DIRECT): NEGATIVE

## 2016-02-09 LAB — INFLUENZA PANEL BY PCR (TYPE A & B)
Influenza A By PCR: NEGATIVE
Influenza B By PCR: NEGATIVE

## 2016-02-09 MED ORDER — HYDROCOD POLST-CPM POLST ER 10-8 MG/5ML PO SUER
ORAL | Status: AC
  Filled 2016-02-09: qty 5

## 2016-02-09 MED ORDER — HYDROCOD POLST-CPM POLST ER 10-8 MG/5ML PO SUER: 5.0000 mL | Freq: Two times a day (BID) | ORAL | 0 refills | Status: DC | PRN

## 2016-02-09 MED ORDER — HYDROCOD POLST-CPM POLST ER 10-8 MG/5ML PO SUER
5.0000 mL | Freq: Once | ORAL | Status: AC
  Administered 2016-02-09: 5 mL via ORAL

## 2016-02-09 MED ORDER — AMOXICILLIN-POT CLAVULANATE 875-125 MG PO TABS
1.0000 | ORAL_TABLET | Freq: Once | ORAL | Status: AC
  Administered 2016-02-09: 1 via ORAL
  Filled 2016-02-09: qty 1

## 2016-02-09 MED ORDER — ONDANSETRON 4 MG PO TBDP
ORAL_TABLET | ORAL | Status: AC
  Filled 2016-02-09: qty 1

## 2016-02-09 MED ORDER — ONDANSETRON 4 MG PO TBDP
4.0000 mg | ORAL_TABLET | Freq: Once | ORAL | Status: AC
  Administered 2016-02-09: 4 mg via ORAL

## 2016-02-09 MED ORDER — AMOXICILLIN-POT CLAVULANATE 200-28.5 MG PO CHEW
1.0000 | CHEWABLE_TABLET | Freq: Two times a day (BID) | ORAL | 0 refills | Status: AC
Start: 2016-02-09 — End: 2016-02-19

## 2016-02-09 MED ORDER — ACETAMINOPHEN 325 MG PO TABS
ORAL_TABLET | ORAL | Status: AC
  Filled 2016-02-09: qty 2

## 2016-02-09 NOTE — ED Provider Notes (Signed)
[facility] Emergency Department Provider Note  ____________________________________________  Time seen: 2:55 AM  I have reviewed the triage vital signs and the nursing notes.   HISTORY  Chief Complaint Cough      HPI Emily Nash is a 25 y.o. female presents with one-week history of fever or chills rhinorrhea and one-day history of left ear pain. Patient states that she took Tylenol Cold and flu before presentation to the emergency department however symptoms have not improved.    Past medical history Cholelithiasis There are no active problems to display for this patient.   Past Surgical History:  Procedure Laterality Date  . CESAREAN SECTION    . CHOLECYSTECTOMY        Allergies No known drug allergies History reviewed. No pertinent family history.  Social History Social History  Substance Use Topics  . Smoking status: Former Games developer  . Smokeless tobacco: Never Used  . Alcohol use No    Review of Systems  Constitutional: Negative for fever. Eyes: Negative for visual changes. ENT: Negative for sore throat.Positive for left ear pain, positive for sore throat positive for rhinorrhea Cardiovascular: Negative for chest pain. Respiratory: Negative for shortness of breath. Positive for cough Gastrointestinal: Negative for abdominal pain, vomiting and diarrhea. Genitourinary: Negative for dysuria. Musculoskeletal: Negative for back pain. Skin: Negative for rash. Neurological: Negative for headaches, focal weakness or numbness.   10-point ROS otherwise negative.  ____________________________________________   PHYSICAL EXAM:  VITAL SIGNS: ED Triage Vitals [02/09/16 0230]  Enc Vitals Group     BP 120/78     Pulse Rate (!) 108     Resp 20     Temp 98.3 F (36.8 C)     Temp Source Oral     SpO2 97 %     Weight 200 lb (90.7 kg)     Height 5\' 2"  (1.575 m)     Head Circumference      Peak Flow      Pain Score 7     Pain Loc       Pain Edu?      Excl. in GC?      Constitutional: Alert and oriented. Well appearing and in no distress. Eyes: Conjunctivae are normal. PERRL. Normal extraocular movements. ENT   Head: Normocephalic and atraumatic.   Nose: No congestion/rhinnorhea.   Mouth/Throat: Mucous membranes are moist.   Neck: No stridor. Ears: Clear fluid noted bilateral TM with bulging Hematological/Lymphatic/Immunilogical: No cervical lymphadenopathy. Cardiovascular: Normal rate, regular rhythm. Normal and symmetric distal pulses are present in all extremities. No murmurs, rubs, or gallops. Respiratory: Normal respiratory effort without tachypnea nor retractions. Breath sounds are clear and equal bilaterally. No wheezes/rales/rhonchi. Gastrointestinal: Soft and nontender. No distention. There is no CVA tenderness. Genitourinary: deferred Musculoskeletal: Nontender with normal range of motion in all extremities. No joint effusions.  No lower extremity tenderness nor edema. Neurologic:  Normal speech and language. No gross focal neurologic deficits are appreciated. Speech is normal.  Skin:  Skin is warm, dry and intact. No rash noted. Psychiatric: Mood and affect are normal. Speech and behavior are normal. Patient exhibits appropriate insight and judgment.  ____________________________________________    LABS (pertinent positives/negatives)  Labs Reviewed  CULTURE, GROUP A STREP ([facility])  INFLUENZA PANEL BY PCR (TYPE A & B, H1N1)  POCT RAPID STREP A      Procedures     INITIAL IMPRESSION / ASSESSMENT AND PLAN / ED COURSE  Pertinent labs & imaging results that were available  during my care of the patient were reviewed by me and considered in my medical decision making (see chart for details).    ____________________________________________   FINAL CLINICAL IMPRESSION(S) / ED DIAGNOSES  Final diagnoses:  Acute bronchitis, unspecified organism      Darci Currentandolph N Seleen Walter,  MD 02/09/16 [phone]

## 2016-02-09 NOTE — ED Triage Notes (Signed)
Pt c/o cough, fever, chills, runny nose, ear pain developing today, and a sore throat x 1 week. Pt has son who is also being seen for similar sxs tonight. Pt presently in no acute distress and is afebrile. Pt last took Tylenol cold and flu x 7.5 hrs ago.

## 2016-02-09 NOTE — ED Notes (Signed)
MD Brown at bedside.

## 2016-02-09 NOTE — ED Notes (Signed)
Reviewed d/c instructions, follow-up care and prescriptions with patient. Pt verbalized understanding.  

## 2016-02-09 NOTE — ED Notes (Signed)
Pt c/o nausea. MD Manson Passey notified. MD Manson Passey ordered 4 mg ODT Zofran

## 2016-02-11 LAB — CULTURE, GROUP A STREP (THRC)

## 2016-03-27 ENCOUNTER — Encounter: Payer: Self-pay | Admitting: Emergency Medicine

## 2016-03-27 DIAGNOSIS — Z5321 Procedure and treatment not carried out due to patient leaving prior to being seen by health care provider: Secondary | ICD-10-CM | POA: Insufficient documentation

## 2016-03-27 DIAGNOSIS — R109 Unspecified abdominal pain: Secondary | ICD-10-CM | POA: Insufficient documentation

## 2016-03-27 DIAGNOSIS — F17210 Nicotine dependence, cigarettes, uncomplicated: Secondary | ICD-10-CM | POA: Insufficient documentation

## 2016-03-27 LAB — URINALYSIS, COMPLETE (UACMP) WITH MICROSCOPIC
BILIRUBIN URINE: NEGATIVE
Glucose, UA: NEGATIVE mg/dL
KETONES UR: 20 mg/dL — AB
LEUKOCYTES UA: NEGATIVE
Nitrite: NEGATIVE
Protein, ur: NEGATIVE mg/dL
Specific Gravity, Urine: 1.029 (ref 1.005–1.030)
pH: 5 (ref 5.0–8.0)

## 2016-03-27 LAB — CBC
HEMATOCRIT: 44.8 % (ref 35.0–47.0)
HEMOGLOBIN: 15.3 g/dL (ref 12.0–16.0)
MCH: 29.7 pg (ref 26.0–34.0)
MCHC: 34.1 g/dL (ref 32.0–36.0)
MCV: 87 fL (ref 80.0–100.0)
Platelets: 339 10*3/uL (ref 150–440)
RBC: 5.15 MIL/uL (ref 3.80–5.20)
RDW: 12.8 % (ref 11.5–14.5)
WBC: 16.4 10*3/uL — AB (ref 3.6–11.0)

## 2016-03-27 LAB — COMPREHENSIVE METABOLIC PANEL
ALT: 30 U/L (ref 14–54)
AST: 28 U/L (ref 15–41)
Albumin: 4.5 g/dL (ref 3.5–5.0)
Alkaline Phosphatase: 65 U/L (ref 38–126)
Anion gap: 8 (ref 5–15)
BUN: 14 mg/dL (ref 6–20)
CHLORIDE: 107 mmol/L (ref 101–111)
CO2: 24 mmol/L (ref 22–32)
Calcium: 8.8 mg/dL — ABNORMAL LOW (ref 8.9–10.3)
Creatinine, Ser: 0.79 mg/dL (ref 0.44–1.00)
Glucose, Bld: 103 mg/dL — ABNORMAL HIGH (ref 65–99)
POTASSIUM: 3.8 mmol/L (ref 3.5–5.1)
Sodium: 139 mmol/L (ref 135–145)
Total Bilirubin: 0.8 mg/dL (ref 0.3–1.2)
Total Protein: 8.7 g/dL — ABNORMAL HIGH (ref 6.5–8.1)

## 2016-03-27 LAB — LIPASE, BLOOD: LIPASE: 26 U/L (ref 11–51)

## 2016-03-27 LAB — POCT PREGNANCY, URINE: PREG TEST UR: NEGATIVE

## 2016-03-27 MED ORDER — ONDANSETRON 4 MG PO TBDP
4.0000 mg | ORAL_TABLET | Freq: Once | ORAL | Status: AC
  Administered 2016-03-27: 4 mg via ORAL

## 2016-03-27 MED ORDER — ONDANSETRON 4 MG PO TBDP
ORAL_TABLET | ORAL | Status: AC
  Administered 2016-03-27: 4 mg via ORAL
  Filled 2016-03-27: qty 1

## 2016-03-27 NOTE — ED Triage Notes (Signed)
Patient ambulatory to triage with steady gait, without difficulty or distress noted; pt reports mid abd pain with N/V/D today

## 2016-03-28 ENCOUNTER — Emergency Department
Admission: EM | Admit: 2016-03-28 | Discharge: 2016-03-28 | Disposition: A | Discharge status: Home / Self Care | Payer: Self-pay | Attending: Student in an Organized Health Care Education/Training Program | Admitting: Student in an Organized Health Care Education/Training Program

## 2016-03-28 ENCOUNTER — Telehealth: Payer: Self-pay | Admitting: Emergency Medicine

## 2016-03-28 NOTE — Telephone Encounter (Signed)
Called patient due to lwot to inquire about condition and follow up plans. No answer and no voicemail set up. 

## 2016-06-01 ENCOUNTER — Emergency Department
Admission: EM | Admit: 2016-06-01 | Discharge: 2016-06-01 | Disposition: A | Discharge status: Home / Self Care | Payer: Self-pay | Attending: Emergency Medicine | Admitting: Emergency Medicine

## 2016-06-01 DIAGNOSIS — F17210 Nicotine dependence, cigarettes, uncomplicated: Secondary | ICD-10-CM | POA: Insufficient documentation

## 2016-06-01 DIAGNOSIS — R112 Nausea with vomiting, unspecified: Secondary | ICD-10-CM | POA: Insufficient documentation

## 2016-06-01 DIAGNOSIS — R197 Diarrhea, unspecified: Secondary | ICD-10-CM | POA: Insufficient documentation

## 2016-06-01 LAB — PREGNANCY, URINE: PREG TEST UR: NEGATIVE

## 2016-06-01 LAB — URINALYSIS, COMPLETE (UACMP) WITH MICROSCOPIC
BACTERIA UA: NONE SEEN
BILIRUBIN URINE: NEGATIVE
Glucose, UA: NEGATIVE mg/dL
HGB URINE DIPSTICK: NEGATIVE
Ketones, ur: NEGATIVE mg/dL
LEUKOCYTES UA: NEGATIVE
NITRITE: NEGATIVE
PH: 6 (ref 5.0–8.0)
Protein, ur: NEGATIVE mg/dL
SPECIFIC GRAVITY, URINE: 1.023 (ref 1.005–1.030)

## 2016-06-01 LAB — CBC WITH DIFFERENTIAL/PLATELET
BASOS ABS: 0.1 10*3/uL (ref 0–0.1)
Basophils Relative: 1 %
EOS PCT: 1 %
Eosinophils Absolute: 0.1 10*3/uL (ref 0–0.7)
HCT: 43.3 % (ref 35.0–47.0)
HEMOGLOBIN: 15.1 g/dL (ref 12.0–16.0)
LYMPHS PCT: 7 %
Lymphs Abs: 0.8 10*3/uL — ABNORMAL LOW (ref 1.0–3.6)
MCH: 30.2 pg (ref 26.0–34.0)
MCHC: 34.8 g/dL (ref 32.0–36.0)
MCV: 86.8 fL (ref 80.0–100.0)
Monocytes Absolute: 0.6 10*3/uL (ref 0.2–0.9)
Monocytes Relative: 5 %
NEUTROS ABS: 10 10*3/uL — AB (ref 1.4–6.5)
NEUTROS PCT: 86 %
Platelets: 297 10*3/uL (ref 150–440)
RBC: 4.99 MIL/uL (ref 3.80–5.20)
RDW: 13.1 % (ref 11.5–14.5)
WBC: 11.6 10*3/uL — AB (ref 3.6–11.0)

## 2016-06-01 LAB — COMPREHENSIVE METABOLIC PANEL
ALT: 25 U/L (ref 14–54)
ANION GAP: 7 (ref 5–15)
AST: 25 U/L (ref 15–41)
Albumin: 4.2 g/dL (ref 3.5–5.0)
Alkaline Phosphatase: 62 U/L (ref 38–126)
BILIRUBIN TOTAL: 0.6 mg/dL (ref 0.3–1.2)
BUN: 13 mg/dL (ref 6–20)
CO2: 22 mmol/L (ref 22–32)
Calcium: 8.6 mg/dL — ABNORMAL LOW (ref 8.9–10.3)
Chloride: 108 mmol/L (ref 101–111)
Creatinine, Ser: 0.74 mg/dL (ref 0.44–1.00)
Glucose, Bld: 105 mg/dL — ABNORMAL HIGH (ref 65–99)
POTASSIUM: 3.9 mmol/L (ref 3.5–5.1)
Sodium: 137 mmol/L (ref 135–145)
TOTAL PROTEIN: 7.9 g/dL (ref 6.5–8.1)

## 2016-06-01 LAB — POCT PREGNANCY, URINE: Preg Test, Ur: NEGATIVE

## 2016-06-01 MED ORDER — PROMETHAZINE HCL 25 MG/ML IJ SOLN
12.5000 mg | Freq: Once | INTRAMUSCULAR | Status: AC
  Administered 2016-06-01: 12.5 mg via INTRAVENOUS
  Filled 2016-06-01: qty 1

## 2016-06-01 MED ORDER — SODIUM CHLORIDE 0.9 % IV SOLN
Freq: Once | INTRAVENOUS | Status: AC
  Administered 2016-06-01: 09:00:00 via INTRAVENOUS

## 2016-06-01 MED ORDER — ONDANSETRON HCL 4 MG/2ML IJ SOLN
4.0000 mg | Freq: Once | INTRAMUSCULAR | Status: AC
  Administered 2016-06-01: 4 mg via INTRAVENOUS
  Filled 2016-06-01: qty 2

## 2016-06-01 MED ORDER — SODIUM CHLORIDE 0.9 % IV SOLN
Freq: Once | INTRAVENOUS | Status: AC
  Administered 2016-06-01: 10:00:00 via INTRAVENOUS

## 2016-06-01 MED ORDER — ONDANSETRON 4 MG PO TBDP: 4.0000 mg | ORAL_TABLET | Freq: Three times a day (TID) | ORAL | 0 refills | Status: DC | PRN

## 2016-06-01 NOTE — ED Notes (Signed)
MD in room to re-assess patient.  Will continue to monitor.   

## 2016-06-01 NOTE — ED Triage Notes (Signed)
Pt c/o N/V/D since 3am

## 2016-06-01 NOTE — ED Notes (Signed)
Patient given ginger ale and crackers.  States she is no longer feeling nauseous.

## 2016-06-01 NOTE — ED Provider Notes (Signed)
[facility] Emergency Department Provider Note   ____________________________________________   First MD Initiated Contact with Patient 06/01/16 [phone]     (approximate)  I have reviewed the triage vital signs and the nursing notes.   HISTORY  Chief Complaint Emesis and Diarrhea    HPI Emily Nash is a 26 y.o. female who comes in with nausea vomiting diarrhea and upper abdominal crampy pain starting this morning at about 3:00. She reports both of her children have had a nor virus or sick with that. She was trying to keep housecleaning but apparently clotted anyway. She is not having any fever. His not vomited or had diarrhea since she's been in the ER but she just got here.   History reviewed. No pertinent past medical history.  There are no active problems to display for this patient.   Past Surgical History:  Procedure Laterality Date  . CESAREAN SECTION    . CHOLECYSTECTOMY      Prior to Admission medications   Medication Sig Start Date End Date Taking? Authorizing Provider  chlorpheniramine-HYDROcodone (TUSSIONEX PENNKINETIC ER) 10-8 MG/5ML SUER Take 5 mLs by mouth every 12 (twelve) hours as needed for cough. 02/09/16   Darci Current, MD  ondansetron (ZOFRAN ODT) 4 MG disintegrating tablet Take 1 tablet (4 mg total) by mouth every 8 (eight) hours as needed for nausea or vomiting. 06/01/16   Arnaldo Natal, MD    Allergies Patient has no known allergies.  No family history on file.  Social History Social History  Substance Use Topics  . Smoking status: Current Every Day Smoker    Packs/day: 0.50    Types: Cigarettes  . Smokeless tobacco: Never Used  . Alcohol use No    Review of Systems Constitutional: No fever/chills Eyes: No visual changes. ENT: No sore throat. Cardiovascular: Denies chest pain. Respiratory: Denies shortness of breath. Gastrointestinal: See history of present illness Genitourinary: Negative for  dysuria. Musculoskeletal: Negative for back pain. Skin: Negative for rash.   10-point ROS otherwise negative.  ____________________________________________   PHYSICAL EXAM:  VITAL SIGNS: ED Triage Vitals [06/01/16 0840]  Enc Vitals Group     BP 120/66     Pulse Rate 98     Resp 18     Temp 98.1 F (36.7 C)     Temp Source Oral     SpO2 98 %     Weight 200 lb (90.7 kg)     Height 5\' 2"  (1.575 m)     Head Circumference      Peak Flow      Pain Score 7     Pain Loc      Pain Edu?      Excl. in GC?     Constitutional: Alert and oriented. HEENT crampy upper abdominal pain Eyes: Conjunctivae are normal. PERRL. EOMI. Head: Atraumatic. Nose: No congestion/rhinnorhea. Mouth/Throat: Mucous membranes are moist.  Oropharynx non-erythematous. Neck: No stridor.  Cardiovascular: Normal rate, regular rhythm. Grossly normal heart sounds.  Good peripheral circulation. Respiratory: Normal respiratory effort.  No retractions. Lungs CTAB. Gastrointestinal: Soft and nontender to palpation but has crampy upper abdominal pain. No distention. No abdominal bruits. No CVA tenderness. Musculoskeletal: No lower extremity tenderness nor edema.  No joint effusions. Neurologic:  Normal speech and language. No gross focal neurologic deficits are appreciated. No gait instability. Skin:  Skin is warm, dry and intact. No rash noted. Psychiatric: Mood and affect are normal. Speech and behavior are normal.  ____________________________________________  LABS (all labs ordered are listed, but only abnormal results are displayed)  Labs Reviewed  COMPREHENSIVE METABOLIC PANEL - Abnormal; Notable for the following:       Result Value   Glucose, Bld 105 (*)    Calcium 8.6 (*)    All other components within normal limits  CBC WITH DIFFERENTIAL/PLATELET - Abnormal; Notable for the following:    WBC 11.6 (*)    Neutro Abs 10.0 (*)    Lymphs Abs 0.8 (*)    All other components within normal limits   URINALYSIS, COMPLETE (UACMP) WITH MICROSCOPIC - Abnormal; Notable for the following:    Color, Urine YELLOW (*)    APPearance HAZY (*)    Squamous Epithelial / LPF 6-30 (*)    All other components within normal limits  C DIFFICILE QUICK SCREEN W PCR REFLEX  GASTROINTESTINAL PANEL BY PCR, STOOL (REPLACES STOOL CULTURE)  PREGNANCY, URINE  POCT PREGNANCY, URINE   ____________________________________________  EKG   ____________________________________________  RADIOLOGY   ____________________________________________   PROCEDURES  Procedure(s) performed:   Procedures  Critical Care performed:  ____________________________________________   INITIAL IMPRESSION / ASSESSMENT AND PLAN / ED COURSE  Pertinent labs & imaging results that were available during my care of the patient were reviewed by me and considered in my medical decision making (see chart for details).  At 11:30 patient tried to reports she tried to drink but it made her very very nauseated so she stopped she still slightly nauseated will try some Zofran.   on discharge patient feeling much better and tolerating by mouth   ____________________________________________   FINAL CLINICAL IMPRESSION(S) / ED DIAGNOSES  Final diagnoses:  Nausea vomiting and diarrhea      NEW MEDICATIONS STARTED DURING THIS VISIT:  Discharge Medication List as of 06/01/2016  1:29 PM       Note:  This document was prepared using Dragon voice recognition software and may include unintentional dictation errors.    Arnaldo Natal, MD 06/01/16 [phone]

## 2016-06-01 NOTE — Discharge Instructions (Signed)
Please drink sips of clear liquids frequently for the next 4-6 hours. After that try that Bratt diet bananas rice applesauce and toast also small amounts frequently. After that he can use regular food. Take the Zofran melt on your tongue pills 3 times a day if needed for nausea and he can use Pepto-Bismol for the diarrhea. It does look like you have noro virus. Please return for fever increased weakness unable to keep down fluids or any blood in the diarrhea. Alternatively he can follow-up with your regular doctor.

## 2016-08-22 ENCOUNTER — Emergency Department: Payer: Self-pay

## 2016-08-22 ENCOUNTER — Encounter: Payer: Self-pay | Admitting: Emergency Medicine

## 2016-08-22 ENCOUNTER — Emergency Department
Admission: EM | Admit: 2016-08-22 | Discharge: 2016-08-22 | Disposition: A | Discharge status: Home / Self Care | Payer: Self-pay | Attending: Emergency Medicine | Admitting: Emergency Medicine

## 2016-08-22 DIAGNOSIS — K5900 Constipation, unspecified: Secondary | ICD-10-CM | POA: Insufficient documentation

## 2016-08-22 DIAGNOSIS — F17210 Nicotine dependence, cigarettes, uncomplicated: Secondary | ICD-10-CM | POA: Insufficient documentation

## 2016-08-22 DIAGNOSIS — R1010 Upper abdominal pain, unspecified: Secondary | ICD-10-CM

## 2016-08-22 DIAGNOSIS — Z79899 Other long term (current) drug therapy: Secondary | ICD-10-CM | POA: Insufficient documentation

## 2016-08-22 DIAGNOSIS — N83201 Unspecified ovarian cyst, right side: Secondary | ICD-10-CM | POA: Insufficient documentation

## 2016-08-22 DIAGNOSIS — Z9049 Acquired absence of other specified parts of digestive tract: Secondary | ICD-10-CM | POA: Insufficient documentation

## 2016-08-22 LAB — COMPREHENSIVE METABOLIC PANEL
ALT: 18 U/L (ref 14–54)
AST: 21 U/L (ref 15–41)
Albumin: 4.2 g/dL (ref 3.5–5.0)
Alkaline Phosphatase: 60 U/L (ref 38–126)
Anion gap: 9 (ref 5–15)
BUN: 10 mg/dL (ref 6–20)
CHLORIDE: 106 mmol/L (ref 101–111)
CO2: 25 mmol/L (ref 22–32)
CREATININE: 0.88 mg/dL (ref 0.44–1.00)
Calcium: 8.9 mg/dL (ref 8.9–10.3)
GFR calc non Af Amer: >60 mL/min (ref >60)
Glucose, Bld: 143 mg/dL — ABNORMAL HIGH (ref 65–99)
POTASSIUM: 3.4 mmol/L — AB (ref 3.5–5.1)
SODIUM: 140 mmol/L (ref 135–145)
Total Bilirubin: 0.5 mg/dL (ref 0.3–1.2)
Total Protein: 7.9 g/dL (ref 6.5–8.1)

## 2016-08-22 LAB — URINALYSIS, COMPLETE (UACMP) WITH MICROSCOPIC
BILIRUBIN URINE: NEGATIVE
Glucose, UA: NEGATIVE mg/dL
KETONES UR: 5 mg/dL — AB
Nitrite: NEGATIVE
PROTEIN: NEGATIVE mg/dL
Specific Gravity, Urine: 1.024 (ref 1.005–1.030)
pH: 5 (ref 5.0–8.0)

## 2016-08-22 LAB — CBC
HEMATOCRIT: 42.1 % (ref 35.0–47.0)
HEMOGLOBIN: 14.5 g/dL (ref 12.0–16.0)
MCH: 30.1 pg (ref 26.0–34.0)
MCHC: 34.4 g/dL (ref 32.0–36.0)
MCV: 87.4 fL (ref 80.0–100.0)
PLATELETS: 258 10*3/uL (ref 150–440)
RBC: 4.82 MIL/uL (ref 3.80–5.20)
RDW: 12.8 % (ref 11.5–14.5)
WBC: 11.8 10*3/uL — ABNORMAL HIGH (ref 3.6–11.0)

## 2016-08-22 LAB — POCT PREGNANCY, URINE: PREG TEST UR: NEGATIVE

## 2016-08-22 LAB — LIPASE, BLOOD: LIPASE: 27 U/L (ref 11–51)

## 2016-08-22 MED ORDER — SODIUM CHLORIDE 0.9 % IV BOLUS (SEPSIS)
500.0000 mL | Freq: Once | INTRAVENOUS | Status: AC
  Administered 2016-08-22: 500 mL via INTRAVENOUS

## 2016-08-22 MED ORDER — DICYCLOMINE HCL 20 MG PO TABS: 20.0000 mg | ORAL_TABLET | Freq: Four times a day (QID) | ORAL | 0 refills | Status: DC | PRN

## 2016-08-22 MED ORDER — ONDANSETRON HCL 4 MG/2ML IJ SOLN
4.0000 mg | Freq: Once | INTRAMUSCULAR | Status: AC
  Administered 2016-08-22: 4 mg via INTRAVENOUS
  Filled 2016-08-22: qty 2

## 2016-08-22 MED ORDER — LACTULOSE 10 GM/15ML PO SOLN: 20.0000 g | Freq: Every day | ORAL | 0 refills | Status: DC | PRN

## 2016-08-22 MED ORDER — IOPAMIDOL (ISOVUE-300) INJECTION 61%
30.0000 mL | Freq: Once | INTRAVENOUS | Status: AC | PRN
  Administered 2016-08-22: 30 mL via ORAL

## 2016-08-22 MED ORDER — IOPAMIDOL (ISOVUE-300) INJECTION 61%
100.0000 mL | Freq: Once | INTRAVENOUS | Status: AC | PRN
  Administered 2016-08-22: 100 mL via INTRAVENOUS

## 2016-08-22 MED ORDER — FAMOTIDINE IN NACL 20-0.9 MG/50ML-% IV SOLN
20.0000 mg | Freq: Once | INTRAVENOUS | Status: AC
  Administered 2016-08-22: 20 mg via INTRAVENOUS
  Filled 2016-08-22: qty 50

## 2016-08-22 MED ORDER — MORPHINE SULFATE (PF) 4 MG/ML IV SOLN
4.0000 mg | Freq: Once | INTRAVENOUS | Status: AC
  Administered 2016-08-22: 4 mg via INTRAVENOUS
  Filled 2016-08-22: qty 1

## 2016-08-22 MED ORDER — DICYCLOMINE HCL 20 MG PO TABS
20.0000 mg | ORAL_TABLET | Freq: Once | ORAL | Status: AC
  Administered 2016-08-22: 20 mg via ORAL
  Filled 2016-08-22: qty 1

## 2016-08-22 NOTE — ED Notes (Signed)
Report off to allison rn  

## 2016-08-22 NOTE — ED Notes (Signed)
Pt has left upper abd pain for 3 days.  Pt reports nausea.  Pt is taking motrin without pain relief.  Denies urinary sx.  Pt alert.  Speech clear.

## 2016-08-22 NOTE — ED Notes (Signed)
Pt returned to tx room at this time  

## 2016-08-22 NOTE — ED Notes (Signed)
Pt to CT at this time.

## 2016-08-22 NOTE — ED Triage Notes (Signed)
Pt comes into the ED via POV c/o LUQ abdominal pain that has been off and on for the past couple of days.  Patient states it also is accompanied with nausea and diarrhea.  Patient in NAD at this time with even and unlabored respirations.   Patient denies any chest pain or shortness of breath at this time.

## 2016-08-22 NOTE — ED Provider Notes (Signed)
[facility] Emergency Department Provider Note   ____________________________________________   First MD Initiated Contact with Patient 08/22/16 [phone]     (approximate)  I have reviewed the triage vital signs and the nursing notes.   HISTORY  Chief Complaint Abdominal Pain    HPI Emily Nash is a 26 y.o. female who presents to the ED from home with a chief complaint of abdominal pain. Patient reports a 2 day history of left upper quadrant abdominal pain radiating to her left flank. It is partially relieved with ibuprofen but returns. Patient describes aching type pain associated with nausea at its highest intensity. Also reports several loose stools over the past couple of days. Has not had similar symptoms previously.Denies associated fever, chills, chest pain, shortness of breath, vomiting, dysuria. Denies recent travel or trauma. Nothing makes her symptoms worse.   Past medical history None  There are no active problems to display for this patient.   Past Surgical History:  Procedure Laterality Date  . CESAREAN SECTION    . CHOLECYSTECTOMY      Prior to Admission medications   Medication Sig Start Date End Date Taking? Authorizing Provider  chlorpheniramine-HYDROcodone (TUSSIONEX PENNKINETIC ER) 10-8 MG/5ML SUER Take 5 mLs by mouth every 12 (twelve) hours as needed for cough. 02/09/16   Darci Current, MD  ondansetron (ZOFRAN ODT) 4 MG disintegrating tablet Take 1 tablet (4 mg total) by mouth every 8 (eight) hours as needed for nausea or vomiting. 06/01/16   Arnaldo Natal, MD    Allergies Patient has no known allergies.  No family history on file.  Social History Social History  Substance Use Topics  . Smoking status: Current Every Day Smoker    Packs/day: 0.50    Types: Cigarettes  . Smokeless tobacco: Never Used  . Alcohol use No    Review of Systems  Constitutional: No fever/chills. Eyes: No visual changes. ENT: No sore  throat. Cardiovascular: Denies chest pain. Respiratory: Denies shortness of breath. Gastrointestinal: Positive for abdominal pain.  Nausea for nausea, no vomiting.  Positive for diarrhea.  No constipation. Genitourinary: Negative for dysuria. Musculoskeletal: Negative for back pain. Skin: Negative for rash. Neurological: Negative for headaches, focal weakness or numbness.   ____________________________________________   PHYSICAL EXAM:  VITAL SIGNS: ED Triage Vitals [08/22/16 0011]  Enc Vitals Group     BP 108/60     Pulse Rate 95     Resp 17     Temp 97.7 F (36.5 C)     Temp Source Oral     SpO2 97 %     Weight 200 lb (90.7 kg)     Height 5\' 2"  (1.575 m)     Head Circumference      Peak Flow      Pain Score 10     Pain Loc      Pain Edu?      Excl. in GC?     Constitutional: Alert and oriented. Well appearing and in no acute distress. Eyes: Conjunctivae are normal. PERRL. EOMI. Head: Atraumatic. Nose: No congestion/rhinnorhea. Mouth/Throat: Mucous membranes are moist.  Oropharynx non-erythematous. Neck: No stridor.   Cardiovascular: Normal rate, regular rhythm. Grossly normal heart sounds.  Good peripheral circulation. Respiratory: Normal respiratory effort.  No retractions. Lungs CTAB. Gastrointestinal: Soft and mildly tender to palpation epigastrium and left upper quadrant without rebound or guarding. No distention. No abdominal bruits. No CVA tenderness. Musculoskeletal: No lower extremity tenderness nor edema.  No joint effusions.  Neurologic:  Normal speech and language. No gross focal neurologic deficits are appreciated. No gait instability. Skin:  Skin is warm, dry and intact. No rash noted. Psychiatric: Mood and affect are normal. Speech and behavior are normal.  ____________________________________________   LABS (all labs ordered are listed, but only abnormal results are displayed)  Labs Reviewed  COMPREHENSIVE METABOLIC PANEL - Abnormal; Notable for  the following:       Result Value   Potassium 3.4 (*)    Glucose, Bld 143 (*)    All other components within normal limits  CBC - Abnormal; Notable for the following:    WBC 11.8 (*)    All other components within normal limits  URINALYSIS, COMPLETE (UACMP) WITH MICROSCOPIC - Abnormal; Notable for the following:    Color, Urine YELLOW (*)    APPearance HAZY (*)    Hgb urine dipstick SMALL (*)    Ketones, ur 5 (*)    Leukocytes, UA SMALL (*)    Bacteria, UA RARE (*)    Squamous Epithelial / LPF 6-30 (*)    All other components within normal limits  LIPASE, BLOOD  POC URINE PREG, ED  POCT PREGNANCY, URINE   ____________________________________________  EKG  None ____________________________________________  RADIOLOGY  CT abdomen/pelvis interpreted per Dr. Cherly Hensen: 1. No acute abnormality seen within the abdomen or pelvis.  2. 4.0 cm right adnexal cystic lesion noted. Pelvic ultrasound could  be considered for further evaluation, on an elective nonemergent  basis.   ____________________________________________   PROCEDURES  Procedure(s) performed: None  Procedures  Critical Care performed: No  ____________________________________________   INITIAL IMPRESSION / ASSESSMENT AND PLAN / ED COURSE  Pertinent labs & imaging results that were available during my care of the patient were reviewed by me and considered in my medical decision making (see chart for details).  26 year old female who presents with a 2 day history of left upper quadrant abdominal pain associated with nausea and diarrhea. She is moderately tender on exam. Laboratory and urinalysis results unremarkable. Will administer analgesics and proceed to CT abdomen/pelvis to evaluate for intra-abdominal etiology.  Clinical Course as of Aug 22 549  Fri Aug 22, 2016  [id number] Patient is much improved. Updated her of CT imaging results. She has no dysuria; will add urine culture for leukocytes seen on urinalysis.  Although she had some loose stools, prior to that she experienced constipation. Based on the stool burden I see on her CT scan, will prescribe lactulose and Bentyl. Patient will follow-up with her PCP early next week. Strict return precautions given. Patient verbalizes understanding and agrees with plan of care.  [JS]    Clinical Course User Index [JS] Irean Hong, MD     ____________________________________________   FINAL CLINICAL IMPRESSION(S) / ED DIAGNOSES  Final diagnoses:  Pain of upper abdomen  Constipation, unspecified constipation type  Cyst of right ovary      NEW MEDICATIONS STARTED DURING THIS VISIT:  New Prescriptions   No medications on file     Note:  This document was prepared using Dragon voice recognition software and may include unintentional dictation errors.    Irean Hong, MD 08/22/16 [phone]

## 2016-08-22 NOTE — Discharge Instructions (Signed)
1. You may take laxative and abdominal pain medicine as needed (Lactulose/Bentyl). 2. Return to the ER for worsening symptoms, persistent vomiting, difficulty breathing or other concerns.

## 2016-08-22 NOTE — ED Notes (Signed)
Pt finished with contrast. CT called. 

## 2016-08-22 NOTE — ED Notes (Signed)

## 2017-05-07 ENCOUNTER — Ambulatory Visit (INDEPENDENT_AMBULATORY_CARE_PROVIDER_SITE_OTHER): Payer: Self-pay | Admitting: Obstetrics and Gynecology

## 2017-05-07 ENCOUNTER — Encounter: Payer: Self-pay | Admitting: Obstetrics and Gynecology

## 2017-05-07 VITALS — BP 120/80 | HR 122 | Ht 62.0 in | Wt 218.0 lb

## 2017-05-07 DIAGNOSIS — N946 Dysmenorrhea, unspecified: Secondary | ICD-10-CM

## 2017-05-07 DIAGNOSIS — Z3046 Encounter for surveillance of implantable subdermal contraceptive: Secondary | ICD-10-CM

## 2017-05-07 DIAGNOSIS — Z01419 Encounter for gynecological examination (general) (routine) without abnormal findings: Secondary | ICD-10-CM

## 2017-05-07 DIAGNOSIS — Z124 Encounter for screening for malignant neoplasm of cervix: Secondary | ICD-10-CM

## 2017-05-07 DIAGNOSIS — F419 Anxiety disorder, unspecified: Secondary | ICD-10-CM

## 2017-05-07 DIAGNOSIS — N914 Secondary oligomenorrhea: Secondary | ICD-10-CM

## 2017-05-07 LAB — POCT URINE PREGNANCY: Preg Test, Ur: NEGATIVE

## 2017-05-07 MED ORDER — MEDROXYPROGESTERONE ACETATE 10 MG PO TABS: 10.0000 mg | ORAL_TABLET | Freq: Every day | ORAL | 0 refills | Status: DC

## 2017-05-07 NOTE — Patient Instructions (Signed)
I value your feedback and entrusting us with your care. If you get a [facility] patient survey, I would appreciate you taking the time to let us know about your experience today. Thank you! 

## 2017-05-07 NOTE — Progress Notes (Signed)
PCP:  Patient, No Pcp Per   Chief Complaint  Patient presents with  . Gynecologic Exam    has had nexplanon 5 years in november     HPI:      [name] is a 27 y.o. [id number] who LMP was Patient's last menstrual period was 02/06/2017., presents today for her NP annual examination.  Her menses are irregular with the expired nexplanon. Usually has some light spotting every month or so. Had been amenorrheic with it the first 3 yrs. Dysmenorrhea recently. Hasn't taken any meds to treat. Had neg UPT ~1 month ago.   Sex activity: single partner, contraception--NONE. Nexplanon placed 11/10 /14 and expired 11/17. Pt interested in removal and starting OCPs. Last Pap: January 31, 2013  Results were: no abnormalities /POS HPV DNA. Due for repeat. Hx of STDs: HSV, HPV  There is no FH of breast cancer. There is no FH of ovarian cancer. The patient does do self-breast exams.  Tobacco use: The patient currently smokes 1/2 packs of cigarettes per day for the past many years. Alcohol use: none No drug use.  Exercise: moderately active  She does get adequate calcium and Vitamin D in her diet.  Pt with anxiety sx, usually daily. Did zoloft PP a few yrs ago.   Pt is self-pay.  Past Medical History:  Diagnosis Date  . Ovarian cyst   . Tobacco use     Past Surgical History:  Procedure Laterality Date  . CESAREAN SECTION    . CHOLECYSTECTOMY      History reviewed. No pertinent family history.  Social History   Socioeconomic History  . Marital status: Single    Spouse name: Not on file  . Number of children: Not on file  . Years of education: Not on file  . Highest education level: Not on file  Social Needs  . Financial resource strain: Not on file  . Food insecurity - worry: Not on file  . Food insecurity - inability: Not on file  . Transportation needs - medical: Not on file  . Transportation needs - non-medical: Not on file  Occupational History  . Not on file    Tobacco Use  . Smoking status: Current Every Day Smoker    Packs/day: 0.50    Types: Cigarettes  . Smokeless tobacco: Never Used  Substance and Sexual Activity  . Alcohol use: No  . Drug use: No  . Sexual activity: Yes    Birth control/protection: Implant  Other Topics Concern  . Not on file  Social History Narrative  . Not on file    No outpatient medications have been marked as taking for the 05/07/17 encounter (Office Visit) with Abdulkadir Emmanuel, Ilona Sorrel, PA-C.     ROS:  Review of Systems  Constitutional: Negative for fatigue, fever and unexpected weight change.  Respiratory: Negative for cough, shortness of breath and wheezing.   Cardiovascular: Negative for chest pain, palpitations and leg swelling.  Gastrointestinal: Negative for blood in stool, constipation, diarrhea, nausea and vomiting.  Endocrine: Negative for cold intolerance, heat intolerance and polyuria.  Genitourinary: Positive for menstrual problem. Negative for dyspareunia, dysuria, flank pain, frequency, genital sores, hematuria, pelvic pain, urgency, vaginal bleeding, vaginal discharge and vaginal pain.  Musculoskeletal: Negative for back pain, joint swelling and myalgias.  Skin: Negative for rash.  Neurological: Negative for dizziness, syncope, light-headedness, numbness and headaches.  Hematological: Negative for adenopathy.  Psychiatric/Behavioral: Positive for agitation. Negative for confusion, sleep disturbance and suicidal ideas. The  patient is not nervous/anxious.      Objective: BP 120/80   Pulse (!) 122   Ht 5\' 2"  (1.575 m)   Wt 218 lb (98.9 kg)   LMP 02/06/2017   BMI 39.87 kg/m    Physical Exam  Constitutional: She is oriented to person, place, and time. She appears well-developed and well-nourished.  Genitourinary: Vagina normal and uterus normal. There is no rash or tenderness on the right labia. There is no rash or tenderness on the left labia. No erythema or tenderness in the vagina. No  vaginal discharge found. Right adnexum does not display mass and does not display tenderness. Left adnexum does not display mass and does not display tenderness. Cervix does not exhibit motion tenderness or polyp. Uterus is not enlarged or tender.  Neck: Normal range of motion. No thyromegaly present.  Cardiovascular: Normal rate, regular rhythm and normal heart sounds.  No murmur heard. Pulmonary/Chest: Effort normal and breath sounds normal. Right breast exhibits no mass, no nipple discharge, no skin change and no tenderness. Left breast exhibits no mass, no nipple discharge, no skin change and no tenderness.  Abdominal: Soft. There is no tenderness. There is no guarding.  Musculoskeletal: Normal range of motion.  Neurological: She is alert and oriented to person, place, and time. No cranial nerve deficit.  Psychiatric: She has a normal mood and affect. Her behavior is normal.  Vitals reviewed.   Results: Results for orders placed or performed in visit on 05/07/17 (from the past 24 hour(s))  POCT urine pregnancy     Status: Normal   Collection Time: 05/07/17  2:07 PM  Result Value Ref Range   Preg Test, Ur Negative Negative    Assessment/Plan: Encounter for annual routine gynecological examination  Cervical cancer screening - MDL pap since self pay. Will call with results since abn 2014. - Plan: Other/Misc lab test  Encounter for surveillance of implantable subdermal contraceptive - Nexplanon expired. Pt wants it removed. Can RTO for removal of get done at [facility] since self-pay. Wants to start OCPs. Condoms in meantime.   Secondary oligomenorrhea - Neg UPT today. Rx provera. F/u prn no bleeding. Nexplanon needs to be removed--question still releasing some hormone to affect menses.  - Plan: POCT urine pregnancy, medroxyPROGESTERone (PROVERA) 10 MG tablet  Dysmenorrhea - Neg exam. See if sx improve wtih withdrawal bleed. NSAIDs.  Anxiety - F/u with PCP.  Meds ordered this encounter    Medications  . medroxyPROGESTERone (PROVERA) 10 MG tablet    Sig: Take 1 tablet (10 mg total) by mouth daily for 7 days.    Dispense:  7 tablet    Refill:  0    Order Specific Question:   Supervising Provider    Answer:   Nadara Mustard [[id number]]             GYN counsel family planning choices, adequate intake of calcium and vitamin D, diet and exercise, tobacco cessation.     F/U  Return in about 1 year (around 05/07/2018).  Emmajane Altamura B. Graclyn Lawther, PA-C 05/07/2017 2:10 PM

## 2017-05-18 ENCOUNTER — Telehealth: Payer: Self-pay | Admitting: Obstetrics and Gynecology

## 2017-05-18 NOTE — Telephone Encounter (Signed)
Pt aware of ASCUS pap. Will add on HPV DNA to MDL pap (plain pap done initially since pt self-pay). Hx of neg pap/pos HPV DNA 2017. Will call with results. If abn, needs colpo but will do through BCCPT program. Pt understands and agrees.

## 2017-05-26 ENCOUNTER — Telehealth: Payer: Self-pay | Admitting: Obstetrics and Gynecology

## 2017-05-26 DIAGNOSIS — R87610 Atypical squamous cells of undetermined significance on cytologic smear of cervix (ASC-US): Secondary | ICD-10-CM

## 2017-05-26 DIAGNOSIS — R87810 Cervical high risk human papillomavirus (HPV) DNA test positive: Principal | ICD-10-CM

## 2017-05-26 NOTE — Telephone Encounter (Addendum)
Pt aware of ASCUS pap/POS HPV DNA. Needs colpo. Will do BCCCP ref first.

## 2017-05-27 ENCOUNTER — Ambulatory Visit: Payer: Self-pay | Attending: Oncology | Admitting: *Deleted

## 2017-05-27 VITALS — BP 135/84 | HR 112 | Temp 98.3°F | Ht 64.0 in | Wt 212.0 lb

## 2017-05-27 DIAGNOSIS — R87810 Cervical high risk human papillomavirus (HPV) DNA test positive: Principal | ICD-10-CM

## 2017-05-27 DIAGNOSIS — R87610 Atypical squamous cells of undetermined significance on cytologic smear of cervix (ASC-US): Secondary | ICD-10-CM

## 2017-05-27 NOTE — Progress Notes (Signed)
Subjective:     Patient ID: Emily Nash, female   DOB: [birth year], 27 y.o.   MRN: [id number]  HPI   Review of Systems     Objective:   Physical Exam     Assessment:     27 year old White female referred by Levin Erp, PA-C for financial asssitance for further evaluation of recent abnormal pap of HPV positive ASCUS.  Reviewed pap results with patient and answered her questions.  Hand-out given on Understanding pap smears.  Patient has been screened for eligibility.  She does not have any insurance, Medicare or Medicaid.  She also meets financial eligibility.  Hand-out given on the Affordable Care Act.    Plan:     Appointment scheduled to see Dr. Gaynelle Arabian at [facility] for colposcopy and possible biopsy.  Will follow-up per [facility]CP protocol.

## 2017-06-03 ENCOUNTER — Ambulatory Visit (INDEPENDENT_AMBULATORY_CARE_PROVIDER_SITE_OTHER): Payer: Self-pay | Admitting: Obstetrics and Gynecology

## 2017-06-03 ENCOUNTER — Encounter: Payer: Self-pay | Admitting: Obstetrics and Gynecology

## 2017-06-03 VITALS — BP 118/78 | HR 108 | Ht 62.0 in | Wt 213.0 lb

## 2017-06-03 DIAGNOSIS — R87810 Cervical high risk human papillomavirus (HPV) DNA test positive: Secondary | ICD-10-CM

## 2017-06-03 DIAGNOSIS — R87610 Atypical squamous cells of undetermined significance on cytologic smear of cervix (ASC-US): Secondary | ICD-10-CM

## 2017-06-03 NOTE — Progress Notes (Signed)
   GYNECOLOGY CLINIC COLPOSCOPY PROCEDURE NOTE  27 y.o. [id number] here for colposcopy for ASCUS with POSITIVE high risk HPV  pap smear on 05/07/17. Discussed underlying role for HPV infection in the development of cervical dysplasia, its natural history and progression/regression, need for surveillance.  Is the patient  pregnant: No LMP: Patient's last menstrual period was 02/06/2017 (exact date). Smoking status:  reports that she has been smoking cigarettes.  She has been smoking about 0.50 packs per day. she has never used smokeless tobacco. Contraception: Nexplanon Number current sexual partners:  1 Number of partners in lifetime:   High risk partner:Yes History of STD:  Yes Future fertility desired:  Yes  Patient given informed consent, signed copy in the chart, time out was performed.  The patient was position in dorsal lithotomy position. Speculum was placed the cervix was visualized.   After application of acetic acid colposcopic inspection of the cervix was undertaken.   Colposcopy adequate, full visualization of transformation zone: Yes acetowhite lesion(s) noted at 12 o'clock and HPV changes noted at 4, 7 o'clock; corresponding biopsies obtained.   ECC specimen obtained:  Yes  All specimens were labeled and sent to pathology.   Patient was given post procedure instructions.  Will follow up pathology and manage accordingly.  Routine preventative health maintenance measures emphasized.  Physical Exam  Genitourinary:      Richelle Ito OB/GYN, [facility] 06/03/17 3:36 PM

## 2017-06-05 LAB — PATHOLOGY

## 2017-06-08 ENCOUNTER — Telehealth: Payer: Self-pay | Admitting: *Deleted

## 2017-06-08 NOTE — Progress Notes (Signed)
Called and discussed result with Wandy. Will ask Harriett Sine to schedule LEEP through [facility].

## 2017-06-08 NOTE — Progress Notes (Signed)
Thank you :)

## 2017-06-08 NOTE — Telephone Encounter (Signed)
Called patient to discuss coming in to complete an application for [facility].  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 Jerene Pitch of her results and need for LEEP procedure.  Patient is scheduled to come in tomorrow at 2:00 to complete paperwork.

## 2017-06-09 ENCOUNTER — Encounter: Payer: Self-pay | Admitting: *Deleted

## 2017-06-09 NOTE — Progress Notes (Signed)
Patient came in today to complete B[facility] Medicaid application.  Pending MD signature, will fax to DSS tomorrow.

## 2017-06-12 ENCOUNTER — Telehealth: Payer: Self-pay | Admitting: Obstetrics and Gynecology

## 2017-06-12 NOTE — Telephone Encounter (Signed)
-----   Message from [name], MD sent at 06/08/2017  8:16 AM EDT ----- °Surgery Booking Request °Patient Full Name:  Emily Nash  °MRN: [id number]  °DOB: 10/18/1990  °Surgeon: [name], MD  °Requested Surgery Date and Time: no preference °Primary Diagnosis AND Code: CIN 2 °Secondary Diagnosis and Code:  °Surgical Procedure: LEEP °L&D Notification: No °Admission Status: office °Length of Surgery: 40 min °Special Case Needs: leep machine °H&P: no (date) °Phone Interview???: yes °Interpreter:no °Language: english  °Medical Clearance: none °Special Scheduling Instructions: patient in [facility]P program ° °

## 2017-06-19 ENCOUNTER — Telehealth: Payer: Self-pay | Admitting: Obstetrics and Gynecology

## 2017-06-19 NOTE — Telephone Encounter (Signed)
-----   Message from [name], MD sent at 06/08/2017  8:16 AM EDT ----- Surgery Booking Request Patient Full Name:  Emily Nash  MRN: [id number]  DOB: May 09, 1990  Surgeon: [name], MD  Requested Surgery Date and Time: no preference Primary Diagnosis AND Code: CIN 2 Secondary Diagnosis and Code:  Surgical Procedure: LEEP L&D Notification: No Admission Status: office Length of Surgery: 40 min Special Case Needs: leep machine H&P: no (date) Phone Interview???: yes Interpreter:no Language: english  Medical Clearance: none Special Scheduling Instructions: patient in [facility] program

## 2017-06-19 NOTE — Telephone Encounter (Signed)
Okay 

## 2017-06-19 NOTE — Telephone Encounter (Signed)
Per [facility], because the patient has children, DSS sent additional paperwork to qualify the patient for regular Medicaid. Per patient, she received the paperwork yesterday, and has a child support group interview scheduled for 06/24/17. Patient will keep me updated. Also, the patient requests the LEEP be done in the OR rather than the office. Phone# and ext given.

## 2017-06-21 ENCOUNTER — Emergency Department: Payer: Medicaid Other

## 2017-06-21 ENCOUNTER — Encounter: Payer: Self-pay | Admitting: Emergency Medicine

## 2017-06-21 ENCOUNTER — Emergency Department
Admission: EM | Admit: 2017-06-21 | Discharge: 2017-06-21 | Disposition: A | Discharge status: Home / Self Care | Payer: Medicaid Other | Attending: Emergency Medicine | Admitting: Emergency Medicine

## 2017-06-21 ENCOUNTER — Other Ambulatory Visit: Payer: Self-pay

## 2017-06-21 DIAGNOSIS — R42 Dizziness and giddiness: Secondary | ICD-10-CM | POA: Insufficient documentation

## 2017-06-21 DIAGNOSIS — J111 Influenza due to unidentified influenza virus with other respiratory manifestations: Secondary | ICD-10-CM | POA: Diagnosis not present

## 2017-06-21 DIAGNOSIS — J101 Influenza due to other identified influenza virus with other respiratory manifestations: Secondary | ICD-10-CM

## 2017-06-21 DIAGNOSIS — R51 Headache: Secondary | ICD-10-CM | POA: Diagnosis not present

## 2017-06-21 DIAGNOSIS — F17210 Nicotine dependence, cigarettes, uncomplicated: Secondary | ICD-10-CM | POA: Insufficient documentation

## 2017-06-21 LAB — CBC WITH DIFFERENTIAL/PLATELET
Basophils Absolute: 0 10*3/uL (ref 0–0.1)
Basophils Relative: 1 %
Eosinophils Absolute: 0 10*3/uL (ref 0–0.7)
Eosinophils Relative: 0 %
HCT: 44.5 % (ref 35.0–47.0)
Hemoglobin: 15.1 g/dL (ref 12.0–16.0)
LYMPHS ABS: 0.7 10*3/uL — AB (ref 1.0–3.6)
LYMPHS PCT: 11 %
MCH: 30.3 pg (ref 26.0–34.0)
MCHC: 33.9 g/dL (ref 32.0–36.0)
MCV: 89.3 fL (ref 80.0–100.0)
MONO ABS: 0.7 10*3/uL (ref 0.2–0.9)
MONOS PCT: 10 %
NEUTROS ABS: 5.2 10*3/uL (ref 1.4–6.5)
Neutrophils Relative %: 78 %
Platelets: 233 10*3/uL (ref 150–440)
RBC: 4.99 MIL/uL (ref 3.80–5.20)
RDW: 13 % (ref 11.5–14.5)
WBC: 6.7 10*3/uL (ref 3.6–11.0)

## 2017-06-21 LAB — URINALYSIS, COMPLETE (UACMP) WITH MICROSCOPIC
BILIRUBIN URINE: NEGATIVE
Glucose, UA: NEGATIVE mg/dL
Hgb urine dipstick: NEGATIVE
Ketones, ur: NEGATIVE mg/dL
LEUKOCYTES UA: NEGATIVE
Nitrite: NEGATIVE
PROTEIN: NEGATIVE mg/dL
SPECIFIC GRAVITY, URINE: 1.018 (ref 1.005–1.030)
pH: 7 (ref 5.0–8.0)

## 2017-06-21 LAB — COMPREHENSIVE METABOLIC PANEL
ALBUMIN: 4 g/dL (ref 3.5–5.0)
ALK PHOS: 71 U/L (ref 38–126)
ALT: 28 U/L (ref 14–54)
AST: 29 U/L (ref 15–41)
Anion gap: 8 (ref 5–15)
BUN: 9 mg/dL (ref 6–20)
CHLORIDE: 107 mmol/L (ref 101–111)
CO2: 24 mmol/L (ref 22–32)
Calcium: 8.8 mg/dL — ABNORMAL LOW (ref 8.9–10.3)
Creatinine, Ser: 0.88 mg/dL (ref 0.44–1.00)
GFR calc Af Amer: >60 mL/min (ref >60)
GFR calc non Af Amer: >60 mL/min (ref >60)
Glucose, Bld: 100 mg/dL — ABNORMAL HIGH (ref 65–99)
POTASSIUM: 3.6 mmol/L (ref 3.5–5.1)
SODIUM: 139 mmol/L (ref 135–145)
Total Bilirubin: 0.5 mg/dL (ref 0.3–1.2)
Total Protein: 8.3 g/dL — ABNORMAL HIGH (ref 6.5–8.1)

## 2017-06-21 LAB — INFLUENZA PANEL BY PCR (TYPE A & B)
Influenza A By PCR: POSITIVE — AB
Influenza B By PCR: NEGATIVE

## 2017-06-21 LAB — FIBRIN DERIVATIVES D-DIMER (ARMC ONLY): FIBRIN DERIVATIVES D-DIMER (ARMC): 252.51 ng{FEU}/mL (ref 0.00–499.00)

## 2017-06-21 LAB — POCT PREGNANCY, URINE: PREG TEST UR: NEGATIVE

## 2017-06-21 MED ORDER — OSELTAMIVIR PHOSPHATE 75 MG PO CAPS: 75.0000 mg | ORAL_CAPSULE | Freq: Two times a day (BID) | ORAL | 0 refills | Status: AC

## 2017-06-21 MED ORDER — ACETAMINOPHEN 500 MG PO TABS
ORAL_TABLET | ORAL | Status: AC
  Administered 2017-06-21: 1000 mg via ORAL
  Filled 2017-06-21: qty 2

## 2017-06-21 MED ORDER — IPRATROPIUM-ALBUTEROL 0.5-2.5 (3) MG/3ML IN SOLN
3.0000 mL | Freq: Once | RESPIRATORY_TRACT | Status: AC
  Administered 2017-06-21: 3 mL via RESPIRATORY_TRACT
  Filled 2017-06-21: qty 3

## 2017-06-21 MED ORDER — ACETAMINOPHEN 500 MG PO TABS
1000.0000 mg | ORAL_TABLET | Freq: Once | ORAL | Status: AC
  Administered 2017-06-21: 1000 mg via ORAL

## 2017-06-21 MED ORDER — OSELTAMIVIR PHOSPHATE 75 MG PO CAPS
75.0000 mg | ORAL_CAPSULE | Freq: Once | ORAL | Status: AC
  Administered 2017-06-21: 75 mg via ORAL
  Filled 2017-06-21: qty 1

## 2017-06-21 NOTE — ED Triage Notes (Signed)
Pt here for dizziness and feels like going to pass out. Ambulatory to check in with steady gait.  Pain to head from coughing per pt. NAD.

## 2017-06-21 NOTE — ED Provider Notes (Signed)
[facility] Emergency Department Provider Note   ____________________________________________   First MD Initiated Contact with Patient 06/21/17 0930     (approximate)  I have reviewed the triage vital signs and the nursing notes.   HISTORY  Chief Complaint Dizziness    HPI Emily Nash is a 27 y.o. female Who complains of feeling dizzy like she is going to pass out. She feels achy all over she feels hot and she reports a cough for 4-5 days. She reports she has a severe headache when she coughs it gets better if she doesn't cough. Came on gradually and is now since she is coughing bad. She denies any weakness or localized numbness. Symptoms have been gradually progressing for 4-5 days. The cough is wet sounding but nonproductive.   Past Medical History:  Diagnosis Date  . Ovarian cyst   . Tobacco use     There are no active problems to display for this patient.   Past Surgical History:  Procedure Laterality Date  . CESAREAN SECTION    . CHOLECYSTECTOMY      Prior to Admission medications   Medication Sig Start Date End Date Taking? Authorizing Provider  etonogestrel (NEXPLANON) 68 MG IMPL implant 1 each by Subdermal route once.    [provider]  medroxyPROGESTERone (PROVERA) 10 MG tablet Take 1 tablet (10 mg total) by mouth daily for 7 days. 05/07/17 05/14/17  Copland, Ilona Sorrel, PA-C  oseltamivir (TAMIFLU) 75 MG capsule Take 1 capsule (75 mg total) by mouth 2 (two) times daily for 5 days. 06/21/17 06/26/17  Arnaldo Natal, MD    Allergies Patient has no known allergies.  Family History  Problem Relation Age of Onset  . Breast cancer Neg Hx   . Cancer Neg Hx   . Heart failure Neg Hx   . Diabetes Neg Hx   . Stroke Neg Hx   . Hypertension Neg Hx     Social History Social History   Tobacco Use  . Smoking status: Current Every Day Smoker    Packs/day: 0.50    Types: Cigarettes  . Smokeless tobacco: Never Used  Substance  Use Topics  . Alcohol use: No  . Drug use: No    Review of Systems  Constitutional:fever/chills Eyes: No visual changes. ENT: No sore throat. Cardiovascular:chest pain. Respiratory: Denies shortness of breath. Gastrointestinal: No abdominal pain.  No nausea, no vomiting.  No diarrhea.  No constipation. Genitourinary: Negative for dysuria. Musculoskeletal: Negative for back pain. Skin: Negative for rash. Neurological: Negative for headaches, focal weakness    ____________________________________________   PHYSICAL EXAM:  VITAL SIGNS: ED Triage Vitals  Enc Vitals Group     BP 06/21/17 0932 134/79     Pulse Rate 06/21/17 0932 99     Resp 06/21/17 0932 20     Temp 06/21/17 0932 99.1 F (37.3 C)     Temp Source 06/21/17 0932 Oral     SpO2 06/21/17 0932 100 %     Weight 06/21/17 0923 213 lb (96.6 kg)     Height 06/21/17 0923 5\' 2"  (1.575 m)     Head Circumference --      Peak Flow --      Pain Score 06/21/17 0923 10     Pain Loc --      Pain Edu? --      Excl. in GC? --     Constitutional: Alert and oriented. Well appearing and in no acute distress. Eyes: Conjunctivae  are normal.  Head: Atraumatic. Nose: No congestion/rhinnorhea. Mouth/Throat: Mucous membranes are moist.  Oropharynx non-erythematous. Neck: No stridor.  Cardiovascular: Normal rate, regular rhythm. Grossly normal heart sounds.  Good peripheral circulation. Respiratory: Normal respiratory effort.  No retractions. Lungs CTAB. Gastrointestinal: Soft and nontender. No distention. No abdominal bruits. No CVA tenderness. Musculoskeletal: No lower extremity tenderness nor edema.   Neurologic:  Normal speech and language. No gross focal neurologic deficits are appreciated. No gait instability. Skin:  Skin is warm, dry and intact. No rash noted. Psychiatric: Mood and affect are normal. Speech and behavior are normal.  ____________________________________________   LABS (all labs ordered are listed, but only  abnormal results are displayed)  Labs Reviewed  COMPREHENSIVE METABOLIC PANEL - Abnormal; Notable for the following components:      Result Value   Glucose, Bld 100 (*)    Calcium 8.8 (*)    Total Protein 8.3 (*)    All other components within normal limits  CBC WITH DIFFERENTIAL/PLATELET - Abnormal; Notable for the following components:   Lymphs Abs 0.7 (*)    All other components within normal limits  URINALYSIS, COMPLETE (UACMP) WITH MICROSCOPIC - Abnormal; Notable for the following components:   Color, Urine YELLOW (*)    APPearance HAZY (*)    Bacteria, UA RARE (*)    Squamous Epithelial / LPF 6-30 (*)    All other components within normal limits  INFLUENZA PANEL BY PCR (TYPE A & B) - Abnormal; Notable for the following components:   Influenza A By PCR POSITIVE (*)    All other components within normal limits  FIBRIN DERIVATIVES D-DIMER ([facility] ONLY)  POC URINE PREG, ED  POCT PREGNANCY, URINE   ____________________________________________  EKG   ____________________________________________  RADIOLOGY  ED MD interpretation:chest x-ray and CT of the head were read as normal. I reviewed the films  Official radiology report(s): Dg Chest 2 View  Result Date: 06/21/2017 CLINICAL DATA:  Cough for 4-5 days EXAM: CHEST - 2 VIEW COMPARISON:  02/09/2016 FINDINGS: Normal heart size and mediastinal contours. There is no edema, consolidation, effusion, or pneumothorax. No acute osseous finding. Cholecystectomy clips. IMPRESSION: No evidence of active disease. Electronically Signed   By: Marnee Spring M.D.   On: 06/21/2017 11:01   Ct Head Wo Contrast  Result Date: 06/21/2017 CLINICAL DATA:  Headache. EXAM: CT HEAD WITHOUT CONTRAST TECHNIQUE: Contiguous axial images were obtained from the base of the skull through the vertex without intravenous contrast. COMPARISON:  None. FINDINGS: Brain: No evidence of acute infarction, hemorrhage, hydrocephalus, extra-axial collection or mass  lesion/mass effect. Vascular: No hyperdense vessel or unexpected calcification. Skull: Normal. Negative for fracture or focal lesion. Sinuses/Orbits: No acute finding. Other: None. IMPRESSION: 1. No acute intracranial abnormalities.  Normal brain. Electronically Signed   By: Signa Kell M.D.   On: 06/21/2017 10:52    ____________________________________________   PROCEDURES  Procedure(s) performed:   Procedures  Critical Care performed:   ____________________________________________   INITIAL IMPRESSION / ASSESSMENT AND PLAN / ED COURSE   patient has the flu rest of the workup looks okay patient has been stable we'll let her go home. Since it's somewhat unclear how long she's had the flu I will try some Tamiflu on her.       ____________________________________________   FINAL CLINICAL IMPRESSION(S) / ED DIAGNOSES  Final diagnoses:  Influenza A     ED Discharge Orders        Ordered    oseltamivir (TAMIFLU) 75 MG capsule  2 times daily     06/21/17 1235       Note:  This document was prepared using Dragon voice recognition software and may include unintentional dictation errors.    Arnaldo Natal, MD 06/21/17 [phone]

## 2017-06-21 NOTE — ED Notes (Signed)
Pt to CT

## 2017-06-21 NOTE — Discharge Instructions (Addendum)
your flu test is positive. Chest x-ray does not show pneumonia. I will give you some Tamiflu to see if that helps although it's been long enough that it may not. You may be sick for another 2-3 days but you should get better after that. If you continue to get worse, develop a high fever shortness of breath or any other worsening or new symptoms please return. Use Robitussin or other over-the-counter cough medicine to help with your cough. Please follow-up with your doctor within the week.

## 2017-07-03 NOTE — Telephone Encounter (Signed)
I spoke to the patient to let her know Medicaid is active for [facility], and gave Dr. Ellie Lunch available OR dates. The patient chose 07/28/17. Dr. Jerene Pitch will get consents day of surgery, and Pre-admit Testing phone interview is to be scheduled. The patient wondered whether she could have her nexplanon removed and/or replaced while in the OR, but understands Medicaid will likely deny due to only having breast and cervical coverage, and decided to call [facility].

## 2017-07-05 NOTE — Telephone Encounter (Signed)
Orders in, thank you

## 2017-07-21 ENCOUNTER — Other Ambulatory Visit: Payer: Self-pay

## 2017-07-21 ENCOUNTER — Encounter
Admission: RE | Admit: 2017-07-21 | Discharge: 2017-07-21 | Disposition: A | Discharge status: Home / Self Care | Payer: Medicaid Other | Source: Ambulatory Visit | Attending: Obstetrics and Gynecology | Admitting: Obstetrics and Gynecology

## 2017-07-21 HISTORY — DX: Tachycardia, unspecified: R00.0

## 2017-07-21 HISTORY — DX: Gastro-esophageal reflux disease without esophagitis: K21.9

## 2017-07-21 NOTE — Patient Instructions (Signed)
Your procedure is scheduled on: 07-28-17 TUESDAY Report to Same Day Surgery 2nd floor medical mall [facility] Entrance-take elevator on left to 2nd floor.  Check in with surgery information desk.) To find out your arrival time please call [phone] between 1PM - 3PM on 07-27-17 MONDAY  Remember: Instructions that are not followed completely may result in serious medical risk, up to and including death, or upon the discretion of your surgeon and anesthesiologist your surgery may need to be rescheduled.    _x___ 1. Do not eat food after midnight the night before your procedure. NO GUM OR CANDY AFTER MIDNIGHT.  You may drink clear liquids up to 2 hours before you are scheduled to arrive at the hospital for your procedure.  Do not drink clear liquids within 2 hours of your scheduled arrival to the hospital.  Clear liquids include  --Water or Apple juice without pulp  --Clear carbohydrate beverage such as ClearFast or Gatorade  --Black Coffee or Clear Tea (No milk, no creamers, do not add anything to the coffee or Tea    __x__ 2. No Alcohol for 24 hours before or after surgery.   __x__3. No Smoking or e-cigarettes for 24 prior to surgery.  Do not use any chewable tobacco products for at least 6 hour prior to surgery   ____  4. Bring all medications with you on the day of surgery if instructed.    __x__ 5. Notify your doctor if there is any change in your medical condition     (cold, fever, infections).    x___6. On the morning of surgery brush your teeth with toothpaste and water.  You may rinse your mouth with mouth wash if you wish.  Do not swallow any toothpaste or mouthwash.   Do not wear jewelry, make-up, hairpins, clips or nail polish.  Do not wear lotions, powders, or perfumes. You may wear deodorant.  Do not shave 48 hours prior to surgery. Men may shave face and neck.  Do not bring valuables to the hospital.    [facility] is not responsible for any belongings or  valuables.               Contacts, dentures or bridgework may not be worn into surgery.  Leave your suitcase in the car. After surgery it may be brought to your room.  For patients admitted to the hospital, discharge time is determined by your treatment team.  _  Patients discharged the day of surgery will not be allowed to drive home.  You will need someone to drive you home and stay with you the night of your procedure.    Please read over the following fact sheets that you were given:   [facility] Preparing for Surgery and or MRSA Information   ____ Take anti-hypertensive listed below, cardiac, seizure, asthma, anti-reflux and psychiatric medicines. These include:  1. NONE  2.  3.  4.  5.  6.  ____Fleets enema or Magnesium Citrate as directed.   ____ Use CHG Soap or sage wipes as directed on instruction sheet   ____ Use inhalers on the day of surgery and bring to hospital day of surgery  ____ Stop Metformin and Janumet 2 days prior to surgery.    ____ Take 1/2 of usual insulin dose the night before surgery and none on the morning surgery.   ____ Follow recommendations from Cardiologist, Pulmonologist or PCP regarding stopping Aspirin, Coumadin, Plavix ,Eliquis, Effient, or Pradaxa, and Pletal.  X____Stop Anti-inflammatories  such as Advil, Aleve, Ibuprofen, Motrin, Naproxen, Naprosyn, Goodies powders or aspirin products NOW-OK to take Tylenol    ____ Stop supplements until after surgery.    ____ Bring C-Pap to the hospital.

## 2017-07-22 ENCOUNTER — Inpatient Hospital Stay: Admission: RE | Admit: 2017-07-22 | Payer: Medicaid Other | Source: Ambulatory Visit

## 2017-07-23 ENCOUNTER — Inpatient Hospital Stay: Admission: RE | Admit: 2017-07-23 | Payer: Medicaid Other | Source: Ambulatory Visit

## 2017-07-24 ENCOUNTER — Inpatient Hospital Stay: Admission: RE | Admit: 2017-07-24 | Payer: Medicaid Other | Source: Ambulatory Visit

## 2017-07-28 ENCOUNTER — Ambulatory Visit: Payer: Medicaid Other | Admitting: Anesthesiology

## 2017-07-28 ENCOUNTER — Ambulatory Visit
Admission: RE | Admit: 2017-07-28 | Discharge: 2017-07-28 | Disposition: A | Discharge status: Home / Self Care | Payer: Medicaid Other | Source: Ambulatory Visit | Attending: Obstetrics and Gynecology | Admitting: Obstetrics and Gynecology

## 2017-07-28 ENCOUNTER — Encounter
Admission: RE | Disposition: A | Discharge status: Home / Self Care | Payer: Self-pay | Source: Ambulatory Visit | Attending: Obstetrics and Gynecology

## 2017-07-28 DIAGNOSIS — R0602 Shortness of breath: Secondary | ICD-10-CM

## 2017-07-28 DIAGNOSIS — F17210 Nicotine dependence, cigarettes, uncomplicated: Secondary | ICD-10-CM | POA: Diagnosis not present

## 2017-07-28 DIAGNOSIS — K219 Gastro-esophageal reflux disease without esophagitis: Secondary | ICD-10-CM | POA: Diagnosis not present

## 2017-07-28 DIAGNOSIS — N871 Moderate cervical dysplasia: Secondary | ICD-10-CM | POA: Diagnosis not present

## 2017-07-28 DIAGNOSIS — Z91040 Latex allergy status: Secondary | ICD-10-CM | POA: Insufficient documentation

## 2017-07-28 DIAGNOSIS — R000 Tachycardia, unspecified: Secondary | ICD-10-CM

## 2017-07-28 HISTORY — PX: LEEP: SHX91

## 2017-07-28 LAB — CBC
HEMATOCRIT: 41.8 % (ref 35.0–47.0)
HEMOGLOBIN: 14.6 g/dL (ref 12.0–16.0)
MCH: 30.7 pg (ref 26.0–34.0)
MCHC: 35.1 g/dL (ref 32.0–36.0)
MCV: 87.7 fL (ref 80.0–100.0)
Platelets: 274 10*3/uL (ref 150–440)
RBC: 4.76 MIL/uL (ref 3.80–5.20)
RDW: 13 % (ref 11.5–14.5)
WBC: 9.1 10*3/uL (ref 3.6–11.0)

## 2017-07-28 LAB — POCT PREGNANCY, URINE: Preg Test, Ur: NEGATIVE

## 2017-07-28 LAB — ABO/RH: ABO/RH(D): O POS

## 2017-07-28 LAB — TYPE AND SCREEN
ABO/RH(D): O POS
ANTIBODY SCREEN: NEGATIVE

## 2017-07-28 SURGERY — LEEP (LOOP ELECTROSURGICAL EXCISION PROCEDURE)
Anesthesia: General | Site: Cervix | Wound class: Clean Contaminated

## 2017-07-28 MED ORDER — ONDANSETRON HCL 4 MG/2ML IJ SOLN
4.0000 mg | Freq: Once | INTRAMUSCULAR | Status: AC | PRN
  Administered 2017-07-28: 4 mg via INTRAVENOUS

## 2017-07-28 MED ORDER — MIDAZOLAM HCL 2 MG/2ML IJ SOLN
INTRAMUSCULAR | Status: AC
  Filled 2017-07-28: qty 2

## 2017-07-28 MED ORDER — PROPOFOL 10 MG/ML IV BOLUS
INTRAVENOUS | Status: AC
  Filled 2017-07-28: qty 20

## 2017-07-28 MED ORDER — ONDANSETRON HCL 4 MG/2ML IJ SOLN
INTRAMUSCULAR | Status: AC
  Filled 2017-07-28: qty 2

## 2017-07-28 MED ORDER — MIDAZOLAM HCL 5 MG/5ML IJ SOLN
INTRAMUSCULAR | Status: DC | PRN
  Administered 2017-07-28: 2 mg via INTRAVENOUS

## 2017-07-28 MED ORDER — LACTATED RINGERS IV SOLN
INTRAVENOUS | Status: DC
  Administered 2017-07-28 (×2): via INTRAVENOUS

## 2017-07-28 MED ORDER — IODINE STRONG (LUGOLS) 5 % PO SOLN
ORAL | Status: AC
  Filled 2017-07-28: qty 1

## 2017-07-28 MED ORDER — KETOROLAC TROMETHAMINE 30 MG/ML IJ SOLN
INTRAMUSCULAR | Status: AC
  Administered 2017-07-28: 30 mg via INTRAVENOUS
  Filled 2017-07-28: qty 1

## 2017-07-28 MED ORDER — ONDANSETRON HCL 4 MG/2ML IJ SOLN
INTRAMUSCULAR | Status: DC | PRN
  Administered 2017-07-28: 4 mg via INTRAVENOUS

## 2017-07-28 MED ORDER — PROPOFOL 10 MG/ML IV BOLUS
INTRAVENOUS | Status: DC | PRN
  Administered 2017-07-28: 200 mg via INTRAVENOUS
  Administered 2017-07-28: 20 mg via INTRAVENOUS

## 2017-07-28 MED ORDER — ACETIC ACID 4% SOLUTION
Status: DC | PRN
  Administered 2017-07-28: 1 via TOPICAL

## 2017-07-28 MED ORDER — FENTANYL CITRATE (PF) 100 MCG/2ML IJ SOLN
INTRAMUSCULAR | Status: AC
  Filled 2017-07-28: qty 2

## 2017-07-28 MED ORDER — IBUPROFEN 800 MG PO TABS: 800.0000 mg | ORAL_TABLET | Freq: Three times a day (TID) | ORAL | 0 refills | Status: DC | PRN

## 2017-07-28 MED ORDER — DEXAMETHASONE SODIUM PHOSPHATE 10 MG/ML IJ SOLN
INTRAMUSCULAR | Status: AC
  Filled 2017-07-28: qty 1

## 2017-07-28 MED ORDER — FENTANYL CITRATE (PF) 100 MCG/2ML IJ SOLN
INTRAMUSCULAR | Status: AC
  Administered 2017-07-28: 25 ug via INTRAVENOUS
  Filled 2017-07-28: qty 2

## 2017-07-28 MED ORDER — ACETIC ACID 3 % SOLN
Status: AC
  Filled 2017-07-28: qty 500

## 2017-07-28 MED ORDER — FAMOTIDINE 20 MG PO TABS
20.0000 mg | ORAL_TABLET | Freq: Once | ORAL | Status: AC
  Administered 2017-07-28: 20 mg via ORAL

## 2017-07-28 MED ORDER — LIDOCAINE-EPINEPHRINE 1 %-1:100000 IJ SOLN
INTRAMUSCULAR | Status: DC | PRN
  Administered 2017-07-28: 10 mL

## 2017-07-28 MED ORDER — FENTANYL CITRATE (PF) 100 MCG/2ML IJ SOLN
25.0000 ug | INTRAMUSCULAR | Status: AC | PRN
  Administered 2017-07-28 (×6): 25 ug via INTRAVENOUS

## 2017-07-28 MED ORDER — DEXAMETHASONE SODIUM PHOSPHATE 10 MG/ML IJ SOLN
INTRAMUSCULAR | Status: DC | PRN
  Administered 2017-07-28: 10 mg via INTRAVENOUS

## 2017-07-28 MED ORDER — FERRIC SUBSULFATE 259 MG/GM EX SOLN
CUTANEOUS | Status: DC | PRN
  Administered 2017-07-28: 1

## 2017-07-28 MED ORDER — FERRIC SUBSULFATE 259 MG/GM EX SOLN
CUTANEOUS | Status: AC
Start: 2017-07-28 — End: ?
  Filled 2017-07-28: qty 8

## 2017-07-28 MED ORDER — KETOROLAC TROMETHAMINE 30 MG/ML IJ SOLN
30.0000 mg | Freq: Once | INTRAMUSCULAR | Status: AC
  Administered 2017-07-28: 30 mg via INTRAVENOUS

## 2017-07-28 MED ORDER — FENTANYL CITRATE (PF) 100 MCG/2ML IJ SOLN
INTRAMUSCULAR | Status: DC | PRN
  Administered 2017-07-28 (×4): 25 ug via INTRAVENOUS

## 2017-07-28 MED ORDER — ONDANSETRON HCL 4 MG/2ML IJ SOLN
INTRAMUSCULAR | Status: AC
Start: 2017-07-28 — End: ?
  Filled 2017-07-28: qty 2

## 2017-07-28 MED ORDER — LIDOCAINE-EPINEPHRINE 1 %-1:100000 IJ SOLN
INTRAMUSCULAR | Status: AC
  Filled 2017-07-28: qty 1

## 2017-07-28 MED ORDER — LACTATED RINGERS IV SOLN: INTRAVENOUS | Status: DC

## 2017-07-28 MED ORDER — FAMOTIDINE 20 MG PO TABS
ORAL_TABLET | ORAL | Status: AC
  Administered 2017-07-28: 20 mg via ORAL
  Filled 2017-07-28: qty 1

## 2017-07-28 SURGICAL SUPPLY — 38 items
APPLICATOR COTTON TIP 6 STRL (MISCELLANEOUS) ×3 IMPLANT
APPLICATOR COTTON TIP 6IN STRL (MISCELLANEOUS) ×6
BAG URINE DRAINAGE (UROLOGICAL SUPPLIES) IMPLANT
CANISTER SUCT 1200ML W/VALVE (MISCELLANEOUS) ×2 IMPLANT
CATH FOLEY 2WAY  5CC 16FR (CATHETERS)
CATH ROBINSON RED A/P 16FR (CATHETERS) IMPLANT
CATH URTH 16FR FL 2W BLN LF (CATHETERS) IMPLANT
CLEANER CAUTERY TIP 5X5 PAD (MISCELLANEOUS) ×1 IMPLANT
CNTNR SPEC 2.5X3XGRAD LEK (MISCELLANEOUS) ×2
CONT SPEC 4OZ STER OR WHT (MISCELLANEOUS) ×2
CONTAINER SPEC 2.5X3XGRAD LEK (MISCELLANEOUS) ×2 IMPLANT
COUNTER NEEDLE 20/40 LG (NEEDLE) ×2 IMPLANT
ELECT LEEP BALL 5MM 12CM (MISCELLANEOUS) ×2
ELECT LEEP LOOP 20X10 R2010 (MISCELLANEOUS) ×2
ELECT LOOP 1.0X1.0CM R1010 (MISCELLANEOUS)
ELECT REM PT RETURN 9FT ADLT (ELECTROSURGICAL) ×2
ELECTRODE LEEP BALL 5MM 12CM (MISCELLANEOUS) ×1 IMPLANT
ELECTRODE LEP LOOP 20X10 R2010 (MISCELLANEOUS) ×1 IMPLANT
ELECTRODE LOOP 1.0X1.0CM R1010 (MISCELLANEOUS) IMPLANT
ELECTRODE REM PT RTRN 9FT ADLT (ELECTROSURGICAL) ×1 IMPLANT
GLOVE BIO SURGEON STRL SZ7 (GLOVE) ×2 IMPLANT
GLOVE BIOGEL PI IND STRL 7.5 (GLOVE) ×1 IMPLANT
GLOVE BIOGEL PI INDICATOR 7.5 (GLOVE) ×1
GOWN STRL REUS W/ TWL LRG LVL3 (GOWN DISPOSABLE) ×2 IMPLANT
GOWN STRL REUS W/TWL LRG LVL3 (GOWN DISPOSABLE) ×2
KIT TURNOVER CYSTO (KITS) ×2 IMPLANT
NEEDLE SPNL 22GX3.5 QUINCKE BK (NEEDLE) ×2 IMPLANT
NS IRRIG 500ML POUR BTL (IV SOLUTION) ×2 IMPLANT
PACK DNC HYST (MISCELLANEOUS) ×2 IMPLANT
PAD CLEANER CAUTERY TIP 5X5 (MISCELLANEOUS) ×1
PAD OB MATERNITY 4.3X12.25 (PERSONAL CARE ITEMS) ×2 IMPLANT
PAD PREP 24X41 OB/GYN DISP (PERSONAL CARE ITEMS) ×2 IMPLANT
PENCIL ELECTRO HAND CTR (MISCELLANEOUS) ×2 IMPLANT
STRAW SMOKE EVAC LEEP 6150 NON (MISCELLANEOUS) ×2 IMPLANT
SYR 10ML LL (SYRINGE) IMPLANT
SYR CONTROL 10ML (SYRINGE) ×2 IMPLANT
TOWEL OR 17X26 4PK STRL BLUE (TOWEL DISPOSABLE) ×2 IMPLANT
TUBING CONNECTING 10 (TUBING) ×2 IMPLANT

## 2017-07-28 NOTE — Discharge Instructions (Signed)
Loop Electrosurgical Excision Procedure, Care After °Refer to this sheet in the next few weeks. These instructions provide you with information about caring for yourself after your procedure. Your health care provider may also give you more specific instructions. Your treatment has been planned according to current medical practices, but problems sometimes occur. Call your health care provider if you have any problems or questions after your procedure. °What can I expect after the procedure? °After the procedure, it is common to have: °· Abdominal cramps that are similar to menstrual cramps. These may last for up to 1 week. °· Pink-tinged or bloody vaginal discharge, including light to moderate bleeding, for 1-2 weeks. °· A dark-colored vaginal discharge. This is from the paste that was applied to your cervix to control bleeding. ° °Follow these instructions at home: °Activity °· Return to your normal activities as told by your health care provider. Ask your health care provider what activities are safe for you. °· Avoid strenuous physical activity for as long as told by your health care provider. °· Do not lift anything that is heavier than 10 lb (4.5 kg) until your health care provider says that it is safe. °Bathing °· Do not take baths, swim, or use a hot tub until your health care provider approves. °· You may take showers. °Lifestyle °· Do not put anything in your vagina for 2 weeks after the procedure or until your health care provider says that it is okay. This includes tampons, creams, and douches. °· Do not have sexual intercourse until your health care provider approves. °General instructions °· Take over-the-counter and prescription medicines only as told by your health care provider. °· Keep all follow-up visits as told by your health care provider. This is important. °Contact a health care provider if: °· You have a fever or chills. °· You feel unusually weak. °· You have vaginal bleeding that is  heavier or longer than a normal menstrual cycle. A sign of this can be soaking a pad with blood. °· You have severe pain. °· You have nausea or vomiting. °· You develop a bad smelling vaginal discharge. °This information is not intended to replace advice given to you by your health care provider. Make sure you discuss any questions you have with your health care provider. °Document Released: 11/21/2010 Document Revised: 04/06/2015 Document Reviewed: 01/22/2015 °Elsevier Interactive Patient Education © 2018 [employer] ° ° ° °AMBULATORY SURGERY  °DISCHARGE INSTRUCTIONS ° ° °1) The drugs that you were given will stay in your system until tomorrow so for the next 24 hours you should not: ° °A) Drive an automobile °B) Make any legal decisions °C) Drink any alcoholic beverage ° ° °2) You may resume regular meals tomorrow.  Today it is better to start with liquids and gradually work up to solid foods. ° °You may eat anything you prefer, but it is better to start with liquids, then soup and crackers, and gradually work up to solid foods. ° ° °3) Please notify your doctor immediately if you have any unusual bleeding, trouble breathing, redness and pain at the surgery site, drainage, fever, or pain not relieved by medication. ° ° ° °4) Additional Instructions: ° ° ° ° ° ° ° °Please contact your physician with any problems or Same Day Surgery at [phone], Monday through Friday 6 am to 4 pm, or [facility] Main number at [phone]. ° °

## 2017-07-28 NOTE — Anesthesia Post-op Follow-up Note (Signed)
Anesthesia QCDR form completed.        

## 2017-07-28 NOTE — H&P (Signed)
History and Physical  Emily Nash is an 27 y.o. female.  HPI: She is being followed for an abnormal pap smear. 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   Past Medical History:  Diagnosis Date  . GERD (gastroesophageal reflux disease)    occ-tums prn  . Ovarian cyst   . Tachycardia    PT STATES THAT OCC SHE WILL FEEL HER HR GET REALLY FAST AND FEELS LIKE SHE CANT GET HER BREATH-ALL OF THIS RESOLVES WITHIN A COUPLE OF SECONDS  . Tobacco use     Past Surgical History:  Procedure Laterality Date  . CESAREAN SECTION    . CHOLECYSTECTOMY    . INDUCED ABORTION      Family History  Problem Relation Age of Onset  . Breast cancer Neg Hx   . Cancer Neg Hx   . Heart failure Neg Hx   . Diabetes Neg Hx   . Stroke Neg Hx   . Hypertension Neg Hx     Social History:  reports that she has been smoking cigarettes.  She has a 5.50 pack-year smoking history. She has never used smokeless tobacco. She reports that she does not drink alcohol or use drugs.  Allergies:  Allergies  Allergen Reactions  . Latex Swelling and Rash    Medications: I have reviewed the patient's current medications.  Results for orders placed or performed during the hospital encounter of 07/28/17 (from the past 48 hour(s))  Pregnancy, urine POC     Status: None   Collection Time: 07/28/17 10:59 AM  Result Value Ref Range   Preg Test, Ur NEGATIVE NEGATIVE    Comment:        THE SENSITIVITY OF THIS METHODOLOGY IS >24 mIU/mL   CBC     Status: None   Collection Time: 07/28/17 11:31 AM  Result Value Ref Range   WBC 9.1 3.6 - 11.0 K/uL   RBC 4.76 3.80 - 5.20 MIL/uL   Hemoglobin 14.6 12.0 - 16.0 g/dL   HCT 43.3 29.5 - 18.8 %   MCV 87.7 80.0 - 100.0 fL   MCH 30.7 26.0 - 34.0 pg   MCHC 35.1 32.0 - 36.0 g/dL   RDW 41.6 60.6 - 30.1 %   Platelets 274 150 - 440 K/uL    Comment: Performed at [facility], [address]  [place], [place] [address]  Type and screen [facility]     Status: None   Collection Time: 07/28/17 11:31 AM  Result Value Ref Range   ABO/RH(D) O POS    Antibody Screen NEG    Sample Expiration      07/31/2017 Performed at [facility], [address], [place], [place] [address]   ABO/Rh     Status: None   Collection Time: 07/28/17 11:36 AM  Result Value Ref Range   ABO/RH(D)      O POS Performed at [facility], [address], [place], [place] [address]     No results found.  Review of Systems  Constitutional: Negative for chills, fever, malaise/fatigue and weight loss.  HENT: Negative for congestion, hearing loss and sinus pain.   Eyes: Negative for blurred vision and double vision.  Respiratory: Negative for cough, sputum production, shortness of breath and wheezing.   Cardiovascular: Negative for chest pain, palpitations, orthopnea and leg swelling.  Gastrointestinal: Negative for abdominal pain, constipation, diarrhea, nausea and  vomiting.  Genitourinary: Negative for dysuria, flank pain, frequency, hematuria and urgency.  Musculoskeletal: Negative for back pain, falls and joint pain.  Skin: Negative for itching and rash.  Neurological: Negative for dizziness and headaches.  Psychiatric/Behavioral: Negative for depression, substance abuse and suicidal ideas. The patient is not nervous/anxious.    Blood pressure 113/75, pulse (!) 104, temperature (!) 97.5 F (36.4 C), temperature source Tympanic, resp. rate 15, height 5\' 2"  (1.575 m), weight 213 lb (96.6 kg), last menstrual period 05/28/2017, SpO2 97 %. Physical Exam  Nursing note and vitals reviewed. Constitutional: She is oriented to person, place, and time. She appears well-developed and well-nourished.  HENT:  Head: Normocephalic and atraumatic.  Cardiovascular: Normal rate and regular rhythm.  Respiratory: Effort normal and breath sounds normal.  GI: Soft. Bowel sounds are  normal.  Musculoskeletal: Normal range of motion.  Neurological: She is alert and oriented to person, place, and time.  Skin: Skin is warm and dry.  Psychiatric: She has a normal mood and affect. Her behavior is normal. Judgment and thought content normal.    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 Discussed risks and benefits of the procedure with the patient including bleeding, cervical scarring, and cervical insufficiency. Pearson reported understanding of all risks and benefits and agreed to proceed with the planned procedure.    [name] 07/28/2017, 5:12 PM

## 2017-07-28 NOTE — Anesthesia Procedure Notes (Signed)
Performed by: Breeze Berringer, CRNA       

## 2017-07-28 NOTE — Anesthesia Procedure Notes (Signed)
Procedure Name: LMA Insertion Date/Time: 07/28/2017 5:52 PM Performed by: Waldo Laine, CRNA Pre-anesthesia Checklist: Patient identified, Patient being monitored, Timeout performed, Emergency Drugs available and Suction available Patient Re-evaluated:Patient Re-evaluated prior to induction Oxygen Delivery Method: Circle system utilized Preoxygenation: Pre-oxygenation with 100% oxygen Induction Type: IV induction Ventilation: Mask ventilation without difficulty LMA: LMA inserted LMA Size: 4.0 Tube type: Oral Number of attempts: 1 Placement Confirmation: positive ETCO2 and breath sounds checked- equal and bilateral Tube secured with: Tape Dental Injury: Teeth and Oropharynx as per pre-operative assessment

## 2017-07-28 NOTE — Anesthesia Preprocedure Evaluation (Addendum)
Anesthesia Evaluation  Patient identified by MRN, date of birth, ID band Patient awake    Reviewed: Allergy & Precautions, H&P , NPO status , Patient's Chart, lab work & pertinent test results  History of Anesthesia Complications Negative for: history of anesthetic complications  Airway Mallampati: III  TM Distance: <3 FB Neck ROM: full    Dental  (+) Chipped, Poor Dentition   Pulmonary neg shortness of breath, Current Smoker,           Cardiovascular Exercise Tolerance: Good + dysrhythmias      Neuro/Psych negative neurological ROS  negative psych ROS   GI/Hepatic Neg liver ROS, GERD  Medicated and Controlled,  Endo/Other  negative endocrine ROS  Renal/GU negative Renal ROS  Female GU complaint     Musculoskeletal negative musculoskeletal ROS (+)   Abdominal   Peds  Hematology negative hematology ROS (+)   Anesthesia Other Findings Past Medical History: No date: GERD (gastroesophageal reflux disease)     Comment:  occ-tums prn No date: Ovarian cyst No date: Tachycardia     Comment:  PT STATES THAT OCC SHE WILL FEEL HER HR GET REALLY FAST               AND FEELS LIKE SHE CANT GET HER BREATH-ALL OF THIS               RESOLVES WITHIN A COUPLE OF SECONDS No date: Tobacco use  Reproductive/Obstetrics negative OB ROS                            Anesthesia Physical Anesthesia Plan  ASA: III  Anesthesia Plan: General and General LMA   Post-op Pain Management:    Induction: Intravenous  PONV Risk Score and Plan: Ondansetron, Dexamethasone, Midazolam and Treatment may vary due to age or medical condition  Airway Management Planned: LMA  Additional Equipment:   Intra-op Plan:   Post-operative Plan: Extubation in OR  Informed Consent: I have reviewed the patients History and Physical, chart, labs and discussed the procedure including the risks, benefits and alternatives for the  proposed anesthesia with the patient or authorized representative who has indicated his/her understanding and acceptance.   Dental advisory given  Plan Discussed with: Surgeon and CRNA  Anesthesia Plan Comments: (Patient consented for risks of anesthesia including but not limited to:  - adverse reactions to medications - damage to teeth, lips or other oral mucosa - sore throat or hoarseness - Damage to heart, brain, lungs or loss of life  Patient voiced understanding.)       Anesthesia Quick Evaluation

## 2017-07-28 NOTE — Transfer of Care (Signed)
Immediate Anesthesia Transfer of Care Note  Patient: Emily Nash  Procedure(s) Performed: LOOP ELECTROSURGICAL EXCISION PROCEDURE (LEEP) (N/A Cervix)  Patient Location: PACU  Anesthesia Type:General  Level of Consciousness: awake, alert , oriented and patient cooperative  Airway & Oxygen Therapy: Patient Spontanous Breathing  Post-op Assessment: Report given to RN and Post -op Vital signs reviewed and stable  Post vital signs: Reviewed and stable  Last Vitals:  Vitals Value Taken Time  BP 122/81 07/28/2017  6:44 PM  Temp    Pulse 111 07/28/2017  6:47 PM  Resp 34 07/28/2017  6:47 PM  SpO2 94 % 07/28/2017  6:47 PM  Vitals shown include unvalidated device data.  Last Pain:  Vitals:   07/28/17 1112  TempSrc: Tympanic         Complications: No apparent anesthesia complications

## 2017-07-28 NOTE — Anesthesia Postprocedure Evaluation (Signed)
Anesthesia Post Note  Patient: Emily Nash  Procedure(s) Performed: LOOP ELECTROSURGICAL EXCISION PROCEDURE (LEEP) (N/A Cervix)  Patient location during evaluation: PACU Anesthesia Type: General Level of consciousness: awake and alert Pain management: pain level controlled Vital Signs Assessment: post-procedure vital signs reviewed and stable Respiratory status: spontaneous breathing, nonlabored ventilation, respiratory function stable and patient connected to nasal cannula oxygen Cardiovascular status: blood pressure returned to baseline and stable Postop Assessment: no apparent nausea or vomiting Anesthetic complications: no     Last Vitals:  Vitals:   07/28/17 1907 07/28/17 1915  BP:  112/77  Pulse: 93 92  Resp: (!) 21 14  Temp:    SpO2: 95% 95%    Last Pain:  Vitals:   07/28/17 1915  TempSrc:   PainSc: 3                  Cleda Mccreedy Piscitello

## 2017-07-28 NOTE — Op Note (Signed)
OPERATIVE REPORT  PATIENT:  Emily Nash  27 y.o. female  PRE-OPERATIVE DIAGNOSIS:  CIN 2  POST-OPERATIVE DIAGNOSIS:  CIN 2  PROCEDURE:  Procedure(s): LOOP ELECTROSURGICAL EXCISION PROCEDURE (LEEP) (N/A)  SURGEON:  Surgeon(s) and Role:    * Schuman, Jaquelyn Bitter, MD - Primary  PHYSICIAN ASSISTANT:   ASSISTANTS: none   ANESTHESIA:   IV sedation  EBL:  Less than 5 cc  BLOOD ADMINISTERED:none  DRAINS: none   LOCAL MEDICATIONS USED:  XYLOCAINE   SPECIMEN:  Source of Specimen:  Cervix  DISPOSITION OF SPECIMEN:  PATHOLOGY  COUNTS:  YES  DESCRIPTION OF PROCEDURE:  Patient was taken to the OR and placed under anesthesia. She was positioned into the dorsal lithotomy position with her feet in candy cane stirrups. She was prepped and draped in the normal sterile fashion. A speculum was placed into the vagina. The cervix was visualized and grasped with a single tooth tenaculum. Acetic acid was applied to the cervix to identify the areas of abnormalities. 10cc of xylocaine were injected to perform a paracervical block at the 3 and 9 o'clock positions. The LEEP was performed in the usual manner using the bovie electrocautery. The specimen was marked and sent to pathology. A post-LEEP endocervical curettage was performed and sent to pathology. The ball electrocautery was used to coagulate the edges of the LEEP. Excellent hemostasis was noted. Monsel's solution was applied to the cervix. The procedure was concluded. The patient was taken to the recovery area in stable condition. There were no complications.   Adelene Idler MD  [facility], [facility] 07/28/2017 6:38 PM

## 2017-07-29 ENCOUNTER — Encounter: Payer: Self-pay | Admitting: Obstetrics and Gynecology

## 2017-07-31 LAB — SURGICAL PATHOLOGY

## 2017-08-03 NOTE — Progress Notes (Signed)
Discussed with patient on the phone. Follow up in 12 and 2 4 months for a pap smear.

## 2017-08-05 ENCOUNTER — Encounter: Payer: Self-pay | Admitting: *Deleted

## 2017-08-05 NOTE — Progress Notes (Signed)
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  Per Dr. Ellie Lunch notes, patient will need follow up paps at annually for 2 years.  Patient was referred from [facility] for abnormal pap.  She can return to them for next pap.  [facility] to [place].

## 2017-08-21 ENCOUNTER — Emergency Department
Admission: EM | Admit: 2017-08-21 | Discharge: 2017-08-21 | Disposition: A | Discharge status: Home / Self Care | Payer: Medicaid Other | Attending: Emergency Medicine | Admitting: Emergency Medicine

## 2017-08-21 ENCOUNTER — Encounter: Payer: Self-pay | Admitting: Emergency Medicine

## 2017-08-21 DIAGNOSIS — F17210 Nicotine dependence, cigarettes, uncomplicated: Secondary | ICD-10-CM | POA: Insufficient documentation

## 2017-08-21 DIAGNOSIS — H9201 Otalgia, right ear: Secondary | ICD-10-CM | POA: Insufficient documentation

## 2017-08-21 DIAGNOSIS — Z79899 Other long term (current) drug therapy: Secondary | ICD-10-CM | POA: Diagnosis not present

## 2017-08-21 MED ORDER — TRAMADOL HCL 50 MG PO TABS: 50.0000 mg | ORAL_TABLET | Freq: Two times a day (BID) | ORAL | 0 refills | Status: DC | PRN

## 2017-08-21 MED ORDER — NEOMYCIN-COLIST-HC-THONZONIUM 3.3-3-10-0.5 MG/ML OT SUSP: 4.0000 [drp] | Freq: Four times a day (QID) | OTIC | 0 refills | Status: DC

## 2017-08-21 NOTE — ED Provider Notes (Signed)
[facility] Emergency Department Provider Note   ____________________________________________   First MD Initiated Contact with Patient 08/21/17 [phone]     (approximate)  I have reviewed the triage vital signs and the nursing notes.   HISTORY  Chief Complaint Otalgia    HPI Emily Nash is a 27 y.o. female patient complaining of right ear pain that started last night.  Patient weakness 1 of her increased pain.  Patient has swam every day this week.  Patient denies hearing loss.  Patient denies vertigo.  Patient rates the pain as a 10/10.  Patient described the pain is "achy".  No palates measured for complaint.   Past Medical History:  Diagnosis Date  . GERD (gastroesophageal reflux disease)    occ-tums prn  . Ovarian cyst   . Tachycardia    PT STATES THAT OCC SHE WILL FEEL HER HR GET REALLY FAST AND FEELS LIKE SHE CANT GET HER BREATH-ALL OF THIS RESOLVES WITHIN A COUPLE OF SECONDS  . Tobacco use     There are no active problems to display for this patient.   Past Surgical History:  Procedure Laterality Date  . CESAREAN SECTION    . CHOLECYSTECTOMY    . INDUCED ABORTION    . LEEP N/A 07/28/2017   Procedure: LOOP ELECTROSURGICAL EXCISION PROCEDURE (LEEP);  Surgeon: [name], MD;  Location: [facility] ORS;  Service: Gynecology;  Laterality: N/A;    Prior to Admission medications   Medication Sig Start Date End Date Taking? Authorizing Provider  calcium carbonate (TUMS - DOSED IN MG ELEMENTAL CALCIUM) 500 MG chewable tablet Chew 1 tablet by mouth as needed for indigestion or heartburn.    [provider]  etonogestrel (NEXPLANON) 68 MG IMPL implant 1 each by Subdermal route once.    [provider]  ibuprofen (ADVIL,MOTRIN) 800 MG tablet Take 1 tablet (800 mg total) by mouth every 8 (eight) hours as needed. 07/28/17   Schuman, Jaquelyn Bitter, MD  neomycin-colistin-hydrocortisone-thonzonium (CORTISPORIN-TC) 3.05-24-08-0.5 MG/ML OTIC  suspension Place 4 drops into both ears 4 (four) times daily. 08/21/17   Joni Reining, PA-C  traMADol (ULTRAM) 50 MG tablet Take 1 tablet (50 mg total) by mouth every 12 (twelve) hours as needed. 08/21/17   Joni Reining, PA-C    Allergies Latex  Family History  Problem Relation Age of Onset  . Breast cancer Neg Hx   . Cancer Neg Hx   . Heart failure Neg Hx   . Diabetes Neg Hx   . Stroke Neg Hx   . Hypertension Neg Hx     Social History Social History   Tobacco Use  . Smoking status: Current Every Day Smoker    Packs/day: 0.50    Years: 11.00    Pack years: 5.50    Types: Cigarettes  . Smokeless tobacco: Never Used  Substance Use Topics  . Alcohol use: No  . Drug use: No    Review of Systems  Constitutional: No fever/chills Eyes: No visual changes. ENT: No sore throat.  Right ear pain Cardiovascular: Denies chest pain. Respiratory: Denies shortness of breath. Gastrointestinal: No abdominal pain.  No nausea, no vomiting.  No diarrhea.  No constipation. Genitourinary: Negative for dysuria. Musculoskeletal: Negative for back pain. Skin: Negative for rash. Neurological: Negative for headaches, focal weakness or numbness. Hematological/Lymphatic: Allergic/Immunilogical: Latex ____________________________________________   PHYSICAL EXAM:  VITAL SIGNS: ED Triage Vitals [08/21/17 0730]  Enc Vitals Group     BP  Pulse      Resp      Temp      Temp src      SpO2      Weight 213 lb (96.6 kg)     Height 5\' 2"  (1.575 m)     Head Circumference      Peak Flow      Pain Score 10     Pain Loc      Pain Edu?      Excl. in GC?    Constitutional: Alert and oriented. Well appearing and in no acute distress. EARS: Erythematous edematous right ear canal.  Left ear unremarkable. Nose: No congestion/rhinnorhea. Mouth/Throat: Mucous membranes are moist.  Oropharynx non-erythematous. Neck: No stridor.   Cardiovascular: Normal rate, regular rhythm. Grossly  normal heart sounds.  Good peripheral circulation. Respiratory: Normal respiratory effort.  No retractions. Lungs CTAB. Neurologic:  Normal speech and language. No gross focal neurologic deficits are appreciated. No gait instability. Skin:  Skin is warm, dry and intact. No rash noted. Psychiatric: Mood and affect are normal. Speech and behavior are normal.  ____________________________________________   LABS (all labs ordered are listed, but only abnormal results are displayed)  Labs Reviewed - No data to display ____________________________________________  EKG   ____________________________________________  RADIOLOGY  ED MD interpretation:    Official radiology report(s): No results found.  ____________________________________________   PROCEDURES  Procedure(s) performed:   Procedures  Critical Care performed: No  ____________________________________________   INITIAL IMPRESSION / ASSESSMENT AND PLAN / ED COURSE  As part of my medical decision making, I reviewed the following data within the electronic MEDICAL RECORD NUMBER    Right ear pain secondary to otitis external.  Patient given discharge care instruction advised use eardrops as directed.  Advised to follow-up PCP if no improvement in 3 days.  Return to ED if condition worsens.     ____________________________________________   FINAL CLINICAL IMPRESSION(S) / ED DIAGNOSES  Final diagnoses:  Otalgia of right ear     ED Discharge Orders        Ordered    neomycin-colistin-hydrocortisone-thonzonium (CORTISPORIN-TC) 3.05-24-08-0.5 MG/ML OTIC suspension  4 times daily     08/21/17 0744    traMADol (ULTRAM) 50 MG tablet  Every 12 hours PRN     08/21/17 0744       Note:  This document was prepared using Dragon voice recognition software and may include unintentional dictation errors.    Joni Reining, PA-C 08/21/17 9604    Minna Antis, MD 08/21/17 1137

## 2017-08-21 NOTE — ED Triage Notes (Signed)
Pt reports right ear pain that started a little last night and when she woke this am it was severe. Pt reports has been swimming but thought it would be ok.

## 2017-08-21 NOTE — ED Notes (Signed)
See triage note  Presents with pain to right ear   States had slight pain yesterday  But pain is increased this am   No fever or drainage noted

## 2017-09-26 ENCOUNTER — Encounter: Payer: Self-pay | Admitting: Emergency Medicine

## 2017-09-26 ENCOUNTER — Emergency Department: Payer: Medicaid Other

## 2017-09-26 ENCOUNTER — Emergency Department
Admission: EM | Admit: 2017-09-26 | Discharge: 2017-09-26 | Disposition: A | Discharge status: Home / Self Care | Payer: Medicaid Other | Attending: Emergency Medicine | Admitting: Emergency Medicine

## 2017-09-26 ENCOUNTER — Other Ambulatory Visit: Payer: Self-pay

## 2017-09-26 DIAGNOSIS — F17210 Nicotine dependence, cigarettes, uncomplicated: Secondary | ICD-10-CM | POA: Insufficient documentation

## 2017-09-26 DIAGNOSIS — Z79899 Other long term (current) drug therapy: Secondary | ICD-10-CM | POA: Insufficient documentation

## 2017-09-26 DIAGNOSIS — G43101 Migraine with aura, not intractable, with status migrainosus: Secondary | ICD-10-CM | POA: Diagnosis not present

## 2017-09-26 DIAGNOSIS — R51 Headache: Secondary | ICD-10-CM | POA: Diagnosis present

## 2017-09-26 LAB — POCT PREGNANCY, URINE: PREG TEST UR: NEGATIVE

## 2017-09-26 LAB — GROUP A STREP BY PCR: Group A Strep by PCR: NOT DETECTED

## 2017-09-26 MED ORDER — DIPHENHYDRAMINE HCL 25 MG PO CAPS
25.0000 mg | ORAL_CAPSULE | Freq: Once | ORAL | Status: AC
  Administered 2017-09-26: 25 mg via ORAL
  Filled 2017-09-26: qty 1

## 2017-09-26 MED ORDER — PROMETHAZINE HCL 25 MG PO TABS
12.5000 mg | ORAL_TABLET | Freq: Once | ORAL | Status: AC
  Administered 2017-09-26: 12.5 mg via ORAL
  Filled 2017-09-26: qty 1

## 2017-09-26 MED ORDER — DIPHENHYDRAMINE HCL 12.5 MG/5ML PO ELIX
25.0000 mg | ORAL_SOLUTION | Freq: Once | ORAL | Status: DC
  Filled 2017-09-26: qty 10

## 2017-09-26 MED ORDER — KETOROLAC TROMETHAMINE 30 MG/ML IJ SOLN
30.0000 mg | Freq: Once | INTRAMUSCULAR | Status: AC
  Administered 2017-09-26: 30 mg via INTRAMUSCULAR
  Filled 2017-09-26: qty 1

## 2017-09-26 NOTE — ED Provider Notes (Signed)
[facility] Emergency Department Provider Note  ____________________________________________  Time seen: Approximately 3:18 PM  I have reviewed the triage vital signs and the nursing notes.   HISTORY  Chief Complaint Headache    HPI Emily Nash is a 27 y.o. female presents to the emergency department with a 10 out of 10 frontal headache associated with changes in vision and nausea that started at approximately noon today.  Patient reports that headache started suddenly today while she was watching TV.  Patient reports that headache came on like "Bam!". She reports this is the worst headache she has ever had in her life.  She does not have a history of migraines.  She does report that headache is associated with chills and a sensation of being clammy.  Her son was diagnosed with strep pharyngitis last week.  No associated rhinorrhea, congestion or nonproductive cough.  Patient does deny pharyngitis.  Patient denies disorientation or confusion.  Patient has taken Excedrin Migraine.   Past Medical History:  Diagnosis Date  . GERD (gastroesophageal reflux disease)    occ-tums prn  . Ovarian cyst   . Tachycardia    PT STATES THAT OCC SHE WILL FEEL HER HR GET REALLY FAST AND FEELS LIKE SHE CANT GET HER BREATH-ALL OF THIS RESOLVES WITHIN A COUPLE OF SECONDS  . Tobacco use     There are no active problems to display for this patient.   Past Surgical History:  Procedure Laterality Date  . CESAREAN SECTION    . CHOLECYSTECTOMY    . INDUCED ABORTION    . LEEP N/A 07/28/2017   Procedure: LOOP ELECTROSURGICAL EXCISION PROCEDURE (LEEP);  Surgeon: [name], MD;  Location: [facility] ORS;  Service: Gynecology;  Laterality: N/A;    Prior to Admission medications   Medication Sig Start Date End Date Taking? Authorizing Provider  calcium carbonate (TUMS - DOSED IN MG ELEMENTAL CALCIUM) 500 MG chewable tablet Chew 1 tablet by mouth as needed for indigestion or  heartburn.    [provider]  etonogestrel (NEXPLANON) 68 MG IMPL implant 1 each by Subdermal route once.    [provider]  ibuprofen (ADVIL,MOTRIN) 800 MG tablet Take 1 tablet (800 mg total) by mouth every 8 (eight) hours as needed. 07/28/17   Schuman, Jaquelyn Bitter, MD  neomycin-colistin-hydrocortisone-thonzonium (CORTISPORIN-TC) 3.05-24-08-0.5 MG/ML OTIC suspension Place 4 drops into both ears 4 (four) times daily. 08/21/17   Joni Reining, PA-C  traMADol (ULTRAM) 50 MG tablet Take 1 tablet (50 mg total) by mouth every 12 (twelve) hours as needed. 08/21/17   Joni Reining, PA-C    Allergies Latex  Family History  Problem Relation Age of Onset  . Breast cancer Neg Hx   . Cancer Neg Hx   . Heart failure Neg Hx   . Diabetes Neg Hx   . Stroke Neg Hx   . Hypertension Neg Hx     Social History Social History   Tobacco Use  . Smoking status: Current Every Day Smoker    Packs/day: 0.50    Years: 11.00    Pack years: 5.50    Types: Cigarettes  . Smokeless tobacco: Never Used  Substance Use Topics  . Alcohol use: No  . Drug use: No     Review of Systems  Constitutional: No fever/chills Eyes: No visual changes. No discharge ENT: No upper respiratory complaints. Cardiovascular: no chest pain. Respiratory: no cough. No SOB. Gastrointestinal: No abdominal pain.  No nausea, no vomiting.  No diarrhea.  No constipation. Genitourinary: Negative for dysuria. No hematuria Musculoskeletal: Negative for musculoskeletal pain. Skin: Negative for rash, abrasions, lacerations, ecchymosis. Neurological: Patient has headache, no focal weakness or numbness.  ____________________________________________   PHYSICAL EXAM:  VITAL SIGNS: ED Triage Vitals  Enc Vitals Group     BP 09/26/17 1419 113/87     Pulse Rate 09/26/17 1419 68     Resp 09/26/17 1419 20     Temp 09/26/17 1419 98.4 F (36.9 C)     Temp Source 09/26/17 1419 Oral     SpO2 09/26/17 1419 98 %      Weight 09/26/17 1420 213 lb (96.6 kg)     Height 09/26/17 1420 5\' 2"  (1.575 m)     Head Circumference --      Peak Flow --      Pain Score 09/26/17 1420 10     Pain Loc --      Pain Edu? --      Excl. in GC? --      Constitutional: Alert and oriented. Well appearing and in no acute distress. Eyes: Conjunctivae are normal. PERRL. EOMI. Head: Atraumatic. ENT:      Ears: TMs are pearly.      Nose: No congestion/rhinnorhea.      Mouth/Throat: Mucous membranes are moist.  Neck: No stridor.  No cervical spine tenderness to palpation. Cardiovascular: Normal rate, regular rhythm. Normal S1 and S2.  Good peripheral circulation. Respiratory: Normal respiratory effort without tachypnea or retractions. Lungs CTAB. Good air entry to the bases with no decreased or absent breath sounds. Gastrointestinal: Bowel sounds 4 quadrants. Soft and nontender to palpation. No guarding or rigidity. No palpable masses. No distention. No CVA tenderness. Musculoskeletal: Full range of motion to all extremities. No gross deformities appreciated. Neurologic:  Normal speech and language. No gross focal neurologic deficits are appreciated.  Skin:  Skin is warm, dry and intact. No rash noted. Psychiatric: Mood and affect are normal. Speech and behavior are normal. Patient exhibits appropriate insight and judgement.   ____________________________________________   LABS (all labs ordered are listed, but only abnormal results are displayed)  Labs Reviewed  GROUP A STREP BY PCR  POC URINE PREG, ED  POCT PREGNANCY, URINE   ____________________________________________  EKG   ____________________________________________  RADIOLOGY I personally viewed and evaluated these images as part of my medical decision making, as well as reviewing the written report by the radiologist.    Ct Head Wo Contrast  Result Date: 09/26/2017 CLINICAL DATA:  Awoke with headache this morning. Pain is 10 out of possible 10. EXAM:  CT HEAD WITHOUT CONTRAST TECHNIQUE: Contiguous axial images were obtained from the base of the skull through the vertex without intravenous contrast. COMPARISON:  Head CT dated 06/21/2017. FINDINGS: Brain: Ventricles are normal in size and configuration. All areas of the brain demonstrate appropriate gray-white matter differentiation. There is no mass, hemorrhage, edema or other evidence of acute parenchymal abnormality. No extra-axial hemorrhage. Vascular: No hyperdense vessel or unexpected calcification. Skull: Normal. Negative for fracture or focal lesion. Sinuses/Orbits: Visualized upper paranasal sinuses are clear. Mastoid air cells are clear. Visualized upper periorbital and retro-orbital soft tissues are unremarkable. Other: None. IMPRESSION: Normal head CT. Electronically Signed   By: Bary Richard M.D.   On: 09/26/2017 16:25    ____________________________________________    PROCEDURES  Procedure(s) performed:    Procedures    Medications  promethazine (PHENERGAN) tablet 12.5 mg (12.5 mg Oral Given 09/26/17 1528)  diphenhydrAMINE (BENADRYL) capsule  25 mg (25 mg Oral Given 09/26/17 1528)  ketorolac (TORADOL) 30 MG/ML injection 30 mg (30 mg Intramuscular Given 09/26/17 1657)     ____________________________________________   INITIAL IMPRESSION / ASSESSMENT AND PLAN / ED COURSE  Pertinent labs & imaging results that were available during my care of the patient were reviewed by me and considered in my medical decision making (see chart for details).  Review of the [place] CSRS was performed in accordance of the [facility] prior to dispensing any controlled drugs.      Assessment and Plan:  Migraine Patient presents to the emergency department with a 10 out of 10 headache that started suddenly 3 hours before presenting to the emergency department.  Due to sudden onset of headache with severity in association with nausea, vomiting and aura, a CT head was obtained.  No neurologic deficits were  appreciated on physical exam.  CT head revealed no acute abnormality and group A strep testing was negative.  Patient's headache resolved in the emergency department with Toradol, Phenergan and Benadryl.  She was advised to follow-up with her primary care provider if headaches become a chronic issue for her.  Vital signs are reassuring prior to discharge.  All patient questions were answered.    ____________________________________________  FINAL CLINICAL IMPRESSION(S) / ED DIAGNOSES  Final diagnoses:  Migraine with aura and with status migrainosus, not intractable      NEW MEDICATIONS STARTED DURING THIS VISIT:  ED Discharge Orders    None          This chart was dictated using voice recognition software/Dragon. Despite best efforts to proofread, errors can occur which can change the meaning. Any change was purely unintentional.    Orvil Feil, PA-C 09/26/17 1741    Emily Filbert, MD 09/26/17 2043

## 2017-09-26 NOTE — ED Notes (Signed)
Pt ambulatory to toilet with son.

## 2017-09-26 NOTE — ED Triage Notes (Signed)
States woke this am with headache "behind eyes". Denies fevers, head injury, sinus drainage, or use of blood thinners. Denies earache or sore throat.

## 2017-09-26 NOTE — ED Notes (Signed)
CT informed by this RN that pt's urine pregnancy was negative. Pt is next on their list.

## 2017-09-26 NOTE — ED Notes (Signed)
Pt attempting to obtain urine specimen at this time.

## 2017-09-26 NOTE — ED Notes (Signed)
Pt ambulatory upon discharge; declined wheel chair. Pt verbalized understanding of discharge instructions and follow-up care. VSS. Skin warm and dry. A&O x4.  

## 2017-09-26 NOTE — ED Notes (Signed)
Pt c/o awaking with headache behind her eyes. Took 1g tylenol at 1130 and then excedrin at 1230. Pt states the pain is 10/10. Alert, talkative, not photophobic. Nad. Denies ever having sinus headache or sinus pain.

## 2018-04-13 DIAGNOSIS — Z6839 Body mass index (BMI) 39.0-39.9, adult: Secondary | ICD-10-CM | POA: Insufficient documentation

## 2018-09-16 DIAGNOSIS — M2669 Other specified disorders of temporomandibular joint: Secondary | ICD-10-CM | POA: Insufficient documentation

## 2019-07-02 IMAGING — CT CT HEAD W/O CM
3 series · 15 of 46 positions shown, 18 images · non-contrast
Comparison: None.

CLINICAL DATA: Headache.

EXAM:
CT HEAD WITHOUT CONTRAST
TECHNIQUE: Contiguous axial images were obtained from the base of the skull
through the vertex without intravenous contrast.

[Series 2: head wo · axial · 0.42mm/px · z∈[-65,+55]mm · 9 of 29 slices shown, 12 images]
[im 3/29  brain]
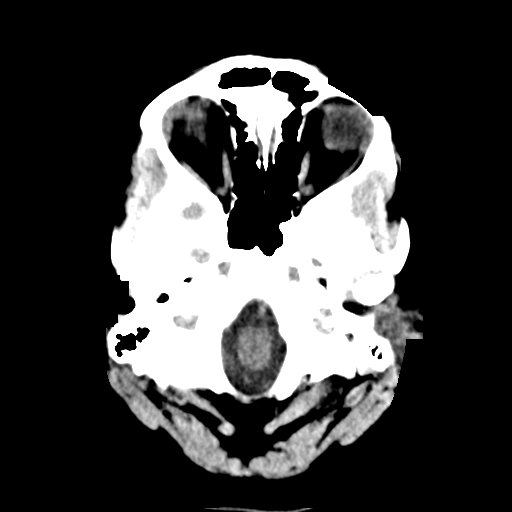
[im 3/29  bone]
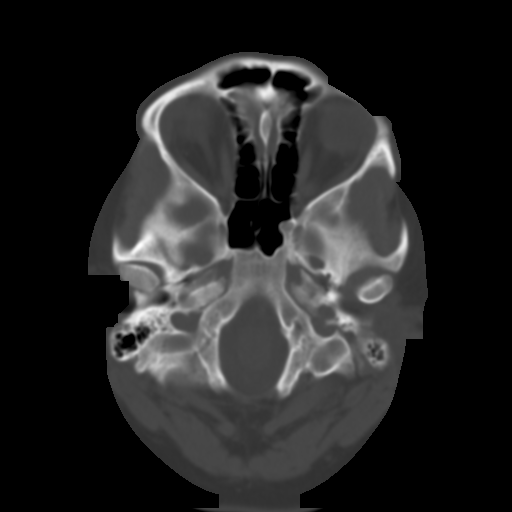
[im 6/29  brain]
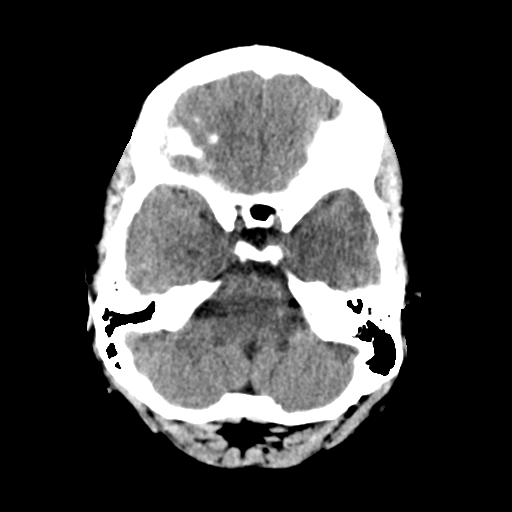
[im 9/29  brain]
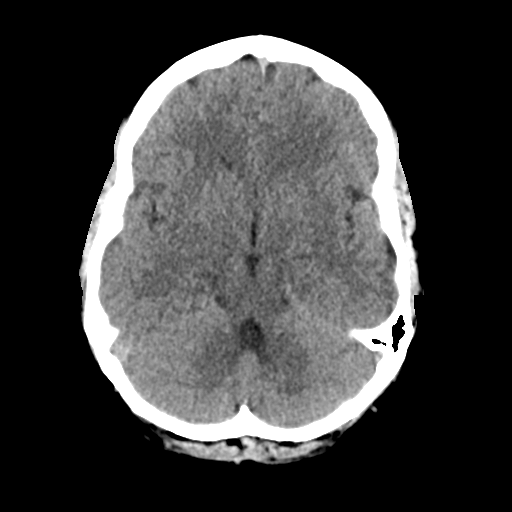
[im 12/29  brain]
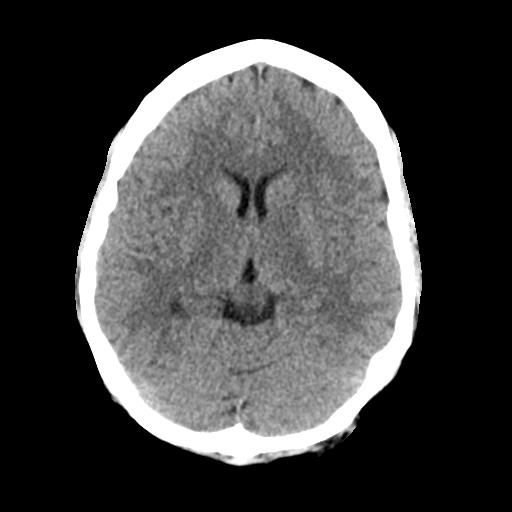
[im 15/29  brain]
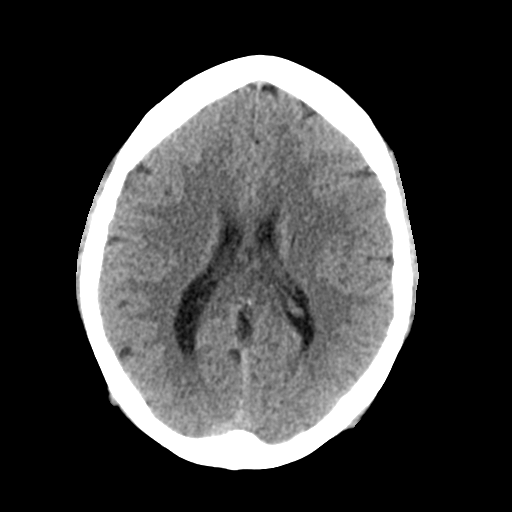
[im 15/29  bone]
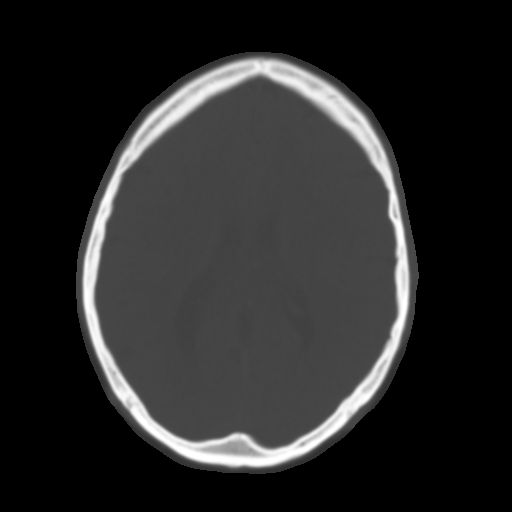
[im 18/29  brain]
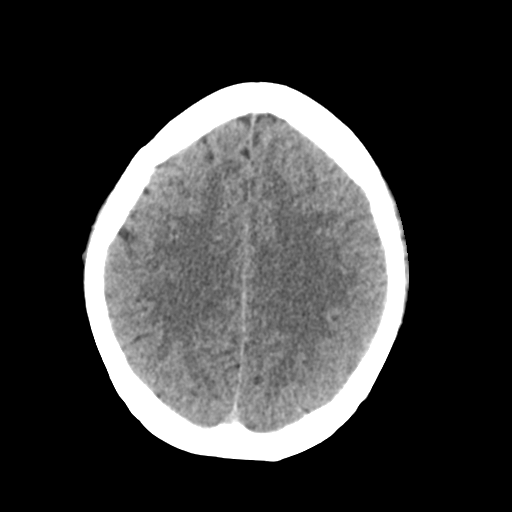
[im 21/29  brain]
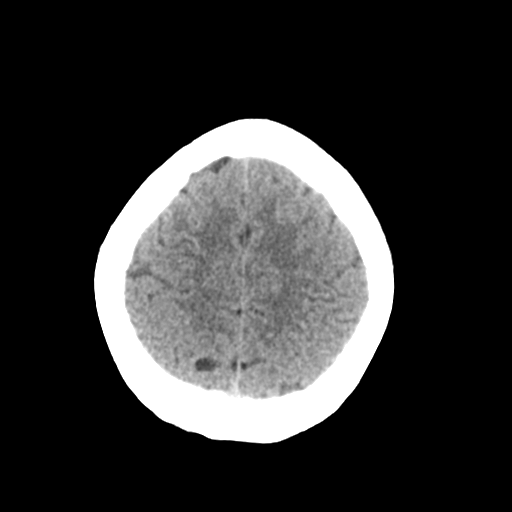
[im 24/29  brain]
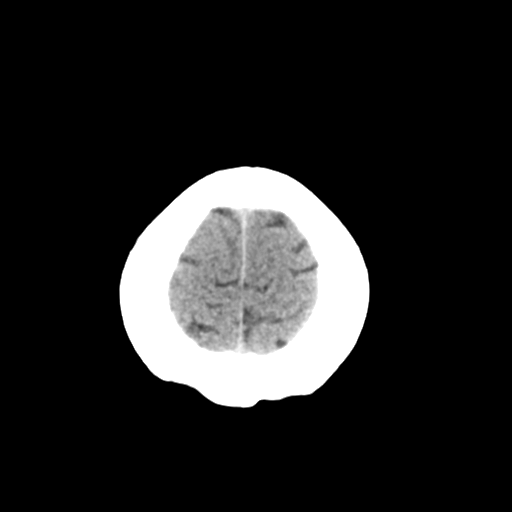
[im 27/29  brain]
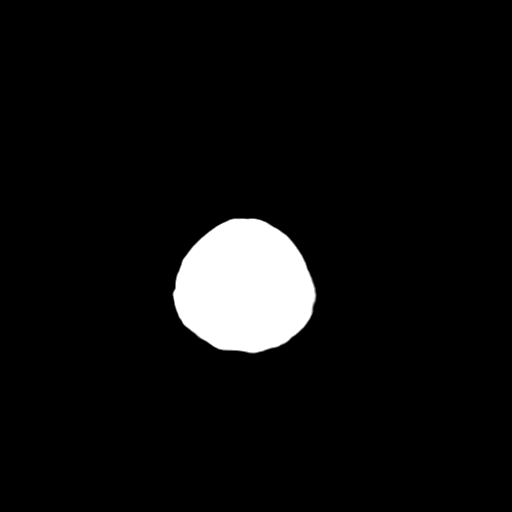
[im 27/29  bone]
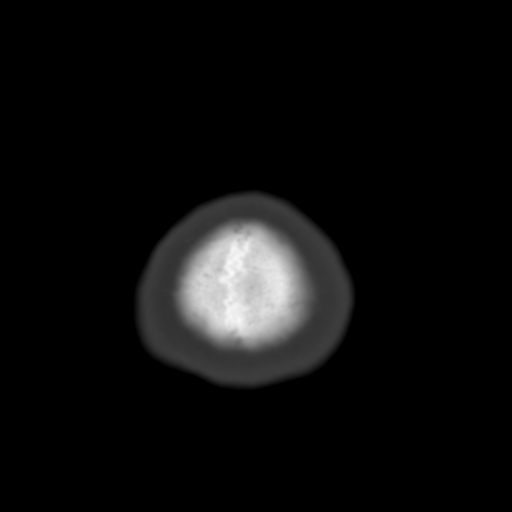

[Series 4: coronal soft tissue · coronal · 0.29mm/px · 3 of 66 slices shown]
[im 22/66  brain]
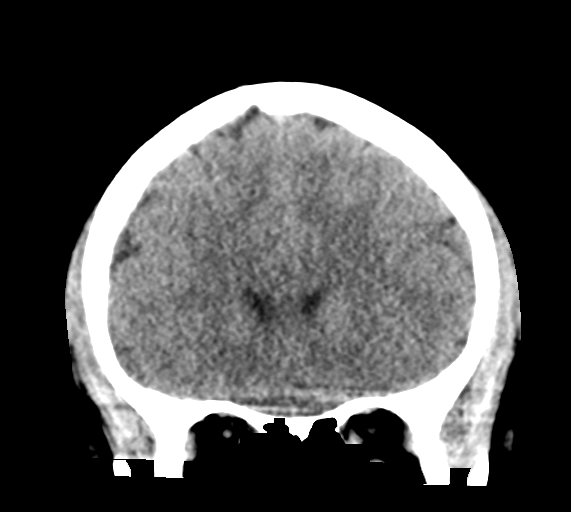
[im 29/66  brain]
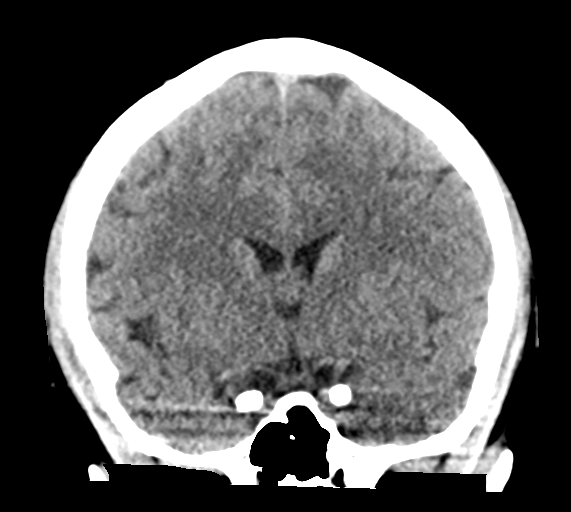
[im 37/66  brain]
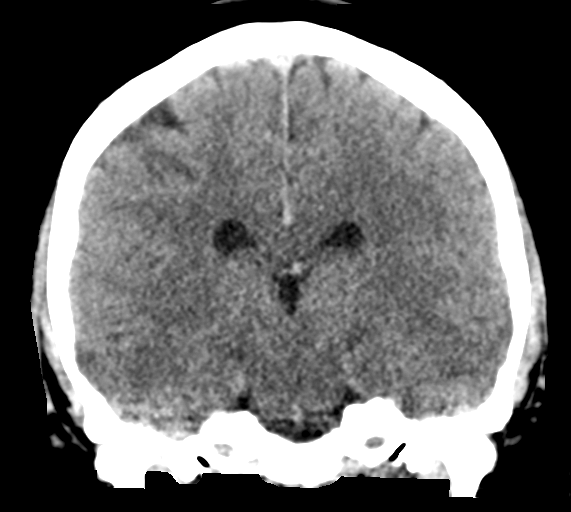

[Series 5: sagittal soft tissue · sagittal · 0.29mm/px · 3 of 56 slices shown]
[im 19/56  brain]
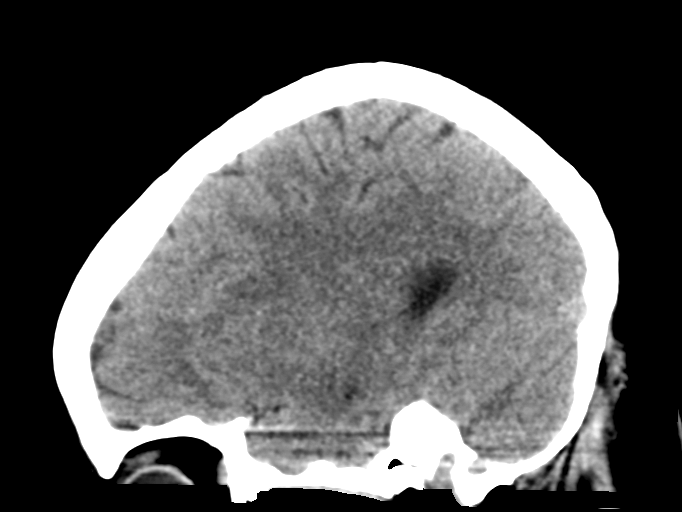
[im 28/56  brain]
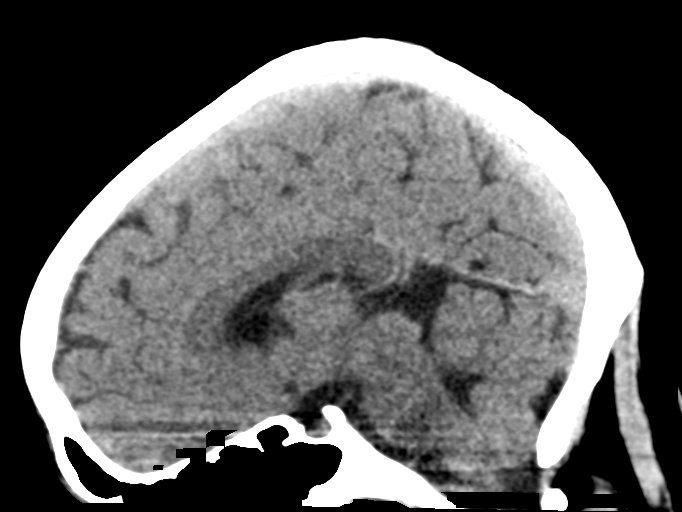
[im 37/56  brain]
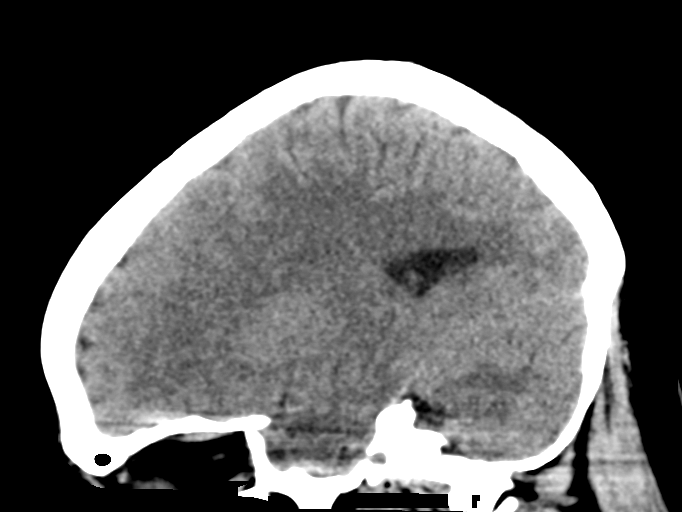

[15 of 46 positions shown; findings below may reference images not displayed]

FINDINGS: Brain: No evidence of acute infarction, hemorrhage, hydrocephalus,
extra-axial collection or mass lesion/mass effect.

Vascular: No hyperdense vessel or unexpected calcification.

Skull: Normal. Negative for fracture or focal lesion.

Sinuses/Orbits: No acute finding.

Other: None.
IMPRESSION: 1. No acute intracranial abnormalities.  Normal brain.

## 2019-07-12 ENCOUNTER — Ambulatory Visit (INDEPENDENT_AMBULATORY_CARE_PROVIDER_SITE_OTHER): Payer: Self-pay | Admitting: Obstetrics and Gynecology

## 2019-07-12 ENCOUNTER — Other Ambulatory Visit: Payer: Self-pay

## 2019-07-12 ENCOUNTER — Encounter: Payer: Self-pay | Admitting: Obstetrics and Gynecology

## 2019-07-12 ENCOUNTER — Encounter: Payer: Self-pay | Admitting: General Practice

## 2019-07-12 VITALS — BP 100/80 | Ht 62.0 in | Wt 175.0 lb

## 2019-07-12 DIAGNOSIS — Z1151 Encounter for screening for human papillomavirus (HPV): Secondary | ICD-10-CM

## 2019-07-12 DIAGNOSIS — N871 Moderate cervical dysplasia: Secondary | ICD-10-CM

## 2019-07-12 DIAGNOSIS — Z124 Encounter for screening for malignant neoplasm of cervix: Secondary | ICD-10-CM

## 2019-07-12 DIAGNOSIS — Z3009 Encounter for other general counseling and advice on contraception: Secondary | ICD-10-CM

## 2019-07-12 DIAGNOSIS — Z01419 Encounter for gynecological examination (general) (routine) without abnormal findings: Secondary | ICD-10-CM

## 2019-07-12 HISTORY — DX: Moderate cervical dysplasia: N87.1

## 2019-07-12 NOTE — Progress Notes (Signed)
PCP:  Chad Cordial, PA-C   Chief Complaint  Patient presents with  . Gynecologic Exam     HPI:      [name] is a 29 y.o. [phone] who LMP was No LMP recorded. Patient has had an implant., presents today for her annual examination.  Her menses are monthly now, lasting 5 days, no BTB, mild dysmen, improved with NSAIDs.  Sex activity: single partner, contraception--NONE. Declines other BC for now. Still has Nexplanon placed 11/10 /14 and expired 11/17. Since self pay, suggested pt go to [facility] for removal at last appt but hasn't had time to go yet. Aware it's not providing any BC protection.   Last Pap: 05/07/17 Results were: ASCUS /POS HPV DNA. 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 Due for repeat pap today. Hx of STDs: HSV, HPV  There is no FH of breast cancer. There is no FH of ovarian cancer. The patient does do self-breast exams.  Tobacco use: stopped smoking last yr Alcohol use: none No drug use.  Exercise: moderately active  She does get adequate calcium but not Vitamin D in her diet.  Pt is self-pay.  Past Medical History:  Diagnosis Date  . Anxiety   . GERD (gastroesophageal reflux disease)    occ-tums prn  . Ovarian cyst   . Tachycardia    PT STATES THAT OCC SHE WILL FEEL HER HR GET REALLY FAST AND FEELS LIKE SHE CANT GET HER BREATH-ALL OF THIS RESOLVES WITHIN A COUPLE OF SECONDS  . Tobacco use    quit 2020    Past Surgical History:  Procedure Laterality Date  . CESAREAN SECTION    . CHOLECYSTECTOMY    . INDUCED ABORTION    . LEEP N/A 07/28/2017   Procedure: LOOP ELECTROSURGICAL EXCISION PROCEDURE (LEEP);  Surgeon: Homero Fellers, MD;  Location: [facility] ORS;  Service: Gynecology;  Laterality: N/A;    Family History  Problem Relation Age of Onset  . Breast cancer Neg Hx   . Cancer Neg Hx   . Heart failure Neg Hx   . Diabetes Neg Hx   . Stroke Neg Hx   . Hypertension Neg Hx     Social History    Socioeconomic History  . Marital status: Single    Spouse name: Not on file  . Number of children: Not on file  . Years of education: Not on file  . Highest education level: Not on file  Occupational History  . Not on file  Tobacco Use  . Smoking status: Current Every Day Smoker    Packs/day: 0.50    Years: 11.00    Pack years: 5.50    Types: Cigarettes  . Smokeless tobacco: Never Used  Substance and Sexual Activity  . Alcohol use: No  . Drug use: No  . Sexual activity: Yes    Birth control/protection: Implant  Other Topics Concern  . Not on file  Social History Narrative  . Not on file   Social Determinants of Health   Financial Resource Strain:   . Difficulty of Paying Living Expenses:   Food Insecurity:   . Worried About Charity fundraiser in the Last Year:   . Arboriculturist in the Last Year:   Transportation Needs:   . Film/video editor (Medical):   Marland Kitchen Lack of Transportation (Non-Medical):   Physical Activity:   . Days of Exercise per Week:   .  Minutes of Exercise per Session:   Stress:   . Feeling of Stress :   Social Connections:   . Frequency of Communication with Friends and Family:   . Frequency of Social Gatherings with Friends and Family:   . Attends Religious Services:   . Active Member of Clubs or Organizations:   . Attends Banker Meetings:   Marland Kitchen Marital Status:   Intimate Partner Violence:   . Fear of Current or Ex-Partner:   . Emotionally Abused:   Marland Kitchen Physically Abused:   . Sexually Abused:     Current Meds  Medication Sig  . etonogestrel (NEXPLANON) 68 MG IMPL implant 1 each by Subdermal route once.     ROS:  Review of Systems  Constitutional: Negative for fatigue, fever and unexpected weight change.  Respiratory: Negative for cough, shortness of breath and wheezing.   Cardiovascular: Negative for chest pain, palpitations and leg swelling.  Gastrointestinal: Negative for blood in stool, constipation, diarrhea,  nausea and vomiting.  Endocrine: Negative for cold intolerance, heat intolerance and polyuria.  Genitourinary: Negative for dyspareunia, dysuria, flank pain, frequency, genital sores, hematuria, menstrual problem, pelvic pain, urgency, vaginal bleeding, vaginal discharge and vaginal pain.  Musculoskeletal: Negative for back pain, joint swelling and myalgias.  Skin: Negative for rash.  Neurological: Negative for dizziness, syncope, light-headedness, numbness and headaches.  Hematological: Negative for adenopathy.  Psychiatric/Behavioral: Negative for agitation, confusion, sleep disturbance and suicidal ideas. The patient is not nervous/anxious.      Objective: BP 100/80   Ht 5\' 2"  (1.575 m)   Wt 175 lb (79.4 kg)   BMI 32.01 kg/m    Physical Exam Constitutional:      Appearance: She is well-developed.  Genitourinary:     Vulva, vagina, uterus, right adnexa and left adnexa normal.     No vulval lesion or tenderness noted.     No vaginal discharge, erythema or tenderness.     No cervical motion tenderness or polyp.     Uterus is not enlarged or tender.     No right or left adnexal mass present.     Right adnexa not tender.     Left adnexa not tender.  Neck:     Thyroid: No thyromegaly.  Cardiovascular:     Rate and Rhythm: Normal rate and regular rhythm.     Heart sounds: Normal heart sounds. No murmur.  Pulmonary:     Effort: Pulmonary effort is normal.     Breath sounds: Normal breath sounds.  Chest:     Breasts:        Right: No mass, nipple discharge, skin change or tenderness.        Left: No mass, nipple discharge, skin change or tenderness.  Abdominal:     Palpations: Abdomen is soft.     Tenderness: There is no abdominal tenderness. There is no guarding.  Musculoskeletal:        General: Normal range of motion.     Cervical back: Normal range of motion.  Neurological:     General: No focal deficit present.     Mental Status: She is alert and oriented to person,  place, and time.     Cranial Nerves: No cranial nerve deficit.  Skin:    General: Skin is warm and dry.  Psychiatric:        Mood and Affect: Mood normal.        Behavior: Behavior normal.        Thought Content: Thought  content normal.        Judgment: Judgment normal.  Vitals reviewed.     Assessment/Plan: Encounter for annual routine gynecological examination  Cervical cancer screening - Plan: Other/Misc lab test  Screening for HPV (human papillomavirus) - Plan: Other/Misc lab test  Dysplasia of cervix, high grade CIN 2; Repeat pap today. Will call with results. Done with [facility] due to self pay.  Encounter for other general counseling or advice on contraception--recommended nexplanon rem at [facility]. Declines other BC for now. F/u prn.      GYN counsel adequate intake of calcium and vitamin D, diet and exercise.     F/U  Return in about 1 year (around 07/11/2020).  Roan Miklos B. Esraa Seres, PA-C 07/12/2019 2:44 PM

## 2019-07-12 NOTE — Patient Instructions (Signed)
I value your feedback and entrusting us with your care. If you get a [facility] patient survey, I would appreciate you taking the time to let us know about your experience today. Thank you!  As of March 03, 2019, your lab results will be released to your MyChart immediately, before I even have a chance to see them. Please give me time to review them and contact you if there are any abnormalities. Thank you for your patience.  

## 2019-07-19 ENCOUNTER — Encounter: Payer: Self-pay | Admitting: General Practice

## 2019-07-26 ENCOUNTER — Telehealth: Payer: Self-pay | Admitting: Obstetrics and Gynecology

## 2019-07-26 NOTE — Telephone Encounter (Signed)
LM with results of ASCUS pap/neg HPV DNA. Repeat in 1 yr given hx of CIN 1-2.

## 2019-10-16 ENCOUNTER — Emergency Department
Admission: EM | Admit: 2019-10-16 | Discharge: 2019-10-17 | Disposition: A | Discharge status: Home / Self Care | Payer: Medicaid Other | Attending: Emergency Medicine | Admitting: Emergency Medicine

## 2019-10-16 ENCOUNTER — Other Ambulatory Visit: Payer: Self-pay

## 2019-10-16 ENCOUNTER — Encounter: Payer: Self-pay | Admitting: Emergency Medicine

## 2019-10-16 DIAGNOSIS — R197 Diarrhea, unspecified: Secondary | ICD-10-CM | POA: Insufficient documentation

## 2019-10-16 DIAGNOSIS — Z5321 Procedure and treatment not carried out due to patient leaving prior to being seen by health care provider: Secondary | ICD-10-CM | POA: Diagnosis not present

## 2019-10-16 DIAGNOSIS — R112 Nausea with vomiting, unspecified: Secondary | ICD-10-CM | POA: Insufficient documentation

## 2019-10-16 DIAGNOSIS — R1013 Epigastric pain: Secondary | ICD-10-CM | POA: Insufficient documentation

## 2019-10-16 LAB — COMPREHENSIVE METABOLIC PANEL
ALT: 26 U/L (ref 0–44)
AST: 19 U/L (ref 15–41)
Albumin: 3.9 g/dL (ref 3.5–5.0)
Alkaline Phosphatase: 69 U/L (ref 38–126)
Anion gap: 9 (ref 5–15)
BUN: 14 mg/dL (ref 6–20)
CO2: 24 mmol/L (ref 22–32)
Calcium: 8.4 mg/dL — ABNORMAL LOW (ref 8.9–10.3)
Chloride: 105 mmol/L (ref 98–111)
Creatinine, Ser: 0.67 mg/dL (ref 0.44–1.00)
GFR calc Af Amer: >60 mL/min (ref >60)
GFR calc non Af Amer: >60 mL/min (ref >60)
Glucose, Bld: 98 mg/dL (ref 70–99)
Potassium: 3.6 mmol/L (ref 3.5–5.1)
Sodium: 138 mmol/L (ref 135–145)
Total Bilirubin: 0.6 mg/dL (ref 0.3–1.2)
Total Protein: 7.3 g/dL (ref 6.5–8.1)

## 2019-10-16 LAB — URINALYSIS, COMPLETE (UACMP) WITH MICROSCOPIC
Bacteria, UA: NONE SEEN
Bilirubin Urine: NEGATIVE
Glucose, UA: NEGATIVE mg/dL
Hgb urine dipstick: NEGATIVE
Ketones, ur: 5 mg/dL — AB
Leukocytes,Ua: NEGATIVE
Nitrite: NEGATIVE
Protein, ur: NEGATIVE mg/dL
Specific Gravity, Urine: 1.028 (ref 1.005–1.030)
pH: 5 (ref 5.0–8.0)

## 2019-10-16 LAB — CBC
HCT: 38.4 % (ref 36.0–46.0)
Hemoglobin: 13.2 g/dL (ref 12.0–15.0)
MCH: 30.7 pg (ref 26.0–34.0)
MCHC: 34.4 g/dL (ref 30.0–36.0)
MCV: 89.3 fL (ref 80.0–100.0)
Platelets: 241 10*3/uL (ref 150–400)
RBC: 4.3 MIL/uL (ref 3.87–5.11)
RDW: 11.9 % (ref 11.5–15.5)
WBC: 8.1 10*3/uL (ref 4.0–10.5)
nRBC: 0 % (ref 0.0–0.2)

## 2019-10-16 LAB — LIPASE, BLOOD: Lipase: 32 U/L (ref 11–51)

## 2019-10-16 NOTE — ED Triage Notes (Signed)
Pt reports for the past week has had mis epigastric pain that radiates around t her left side. Pt reports feels like her gallbladder but she has already had that removed.

## 2019-10-17 LAB — POCT PREGNANCY, URINE: Preg Test, Ur: NEGATIVE

## 2019-10-20 ENCOUNTER — Encounter: Payer: Self-pay | Admitting: Emergency Medicine

## 2019-10-20 ENCOUNTER — Emergency Department
Admission: EM | Admit: 2019-10-20 | Discharge: 2019-10-20 | Disposition: A | Discharge status: Home / Self Care | Payer: Medicaid Other | Attending: Student in an Organized Health Care Education/Training Program | Admitting: Student in an Organized Health Care Education/Training Program

## 2019-10-20 ENCOUNTER — Emergency Department: Payer: Medicaid Other

## 2019-10-20 ENCOUNTER — Other Ambulatory Visit: Payer: Self-pay

## 2019-10-20 DIAGNOSIS — F17210 Nicotine dependence, cigarettes, uncomplicated: Secondary | ICD-10-CM | POA: Insufficient documentation

## 2019-10-20 DIAGNOSIS — Z91040 Latex allergy status: Secondary | ICD-10-CM | POA: Diagnosis not present

## 2019-10-20 DIAGNOSIS — K219 Gastro-esophageal reflux disease without esophagitis: Secondary | ICD-10-CM | POA: Insufficient documentation

## 2019-10-20 DIAGNOSIS — R1084 Generalized abdominal pain: Secondary | ICD-10-CM | POA: Diagnosis present

## 2019-10-20 DIAGNOSIS — R1013 Epigastric pain: Secondary | ICD-10-CM

## 2019-10-20 LAB — COMPREHENSIVE METABOLIC PANEL
ALT: 22 U/L (ref 0–44)
AST: 17 U/L (ref 15–41)
Albumin: 4.3 g/dL (ref 3.5–5.0)
Alkaline Phosphatase: 47 U/L (ref 38–126)
Anion gap: 13 (ref 5–15)
BUN: 9 mg/dL (ref 6–20)
CO2: 22 mmol/L (ref 22–32)
Calcium: 8.9 mg/dL (ref 8.9–10.3)
Chloride: 104 mmol/L (ref 98–111)
Creatinine, Ser: 0.68 mg/dL (ref 0.44–1.00)
GFR calc Af Amer: >60 mL/min (ref >60)
GFR calc non Af Amer: >60 mL/min (ref >60)
Glucose, Bld: 91 mg/dL (ref 70–99)
Potassium: 3.3 mmol/L — ABNORMAL LOW (ref 3.5–5.1)
Sodium: 139 mmol/L (ref 135–145)
Total Bilirubin: 1 mg/dL (ref 0.3–1.2)
Total Protein: 7.9 g/dL (ref 6.5–8.1)

## 2019-10-20 LAB — URINALYSIS, COMPLETE (UACMP) WITH MICROSCOPIC
Bacteria, UA: NONE SEEN
Bilirubin Urine: NEGATIVE
Glucose, UA: NEGATIVE mg/dL
Ketones, ur: 80 mg/dL — AB
Leukocytes,Ua: NEGATIVE
Nitrite: NEGATIVE
Protein, ur: 100 mg/dL — AB
RBC / HPF: >50 RBC/hpf — ABNORMAL HIGH (ref 0–5)
Specific Gravity, Urine: >1.046 — ABNORMAL HIGH (ref 1.005–1.030)
pH: 5 (ref 5.0–8.0)

## 2019-10-20 LAB — CBC WITH DIFFERENTIAL/PLATELET
Abs Immature Granulocytes: 0.03 10*3/uL (ref 0.00–0.07)
Basophils Absolute: 0 10*3/uL (ref 0.0–0.1)
Basophils Relative: 0 %
Eosinophils Absolute: 0 10*3/uL (ref 0.0–0.5)
Eosinophils Relative: 0 %
HCT: 42 % (ref 36.0–46.0)
Hemoglobin: 14.6 g/dL (ref 12.0–15.0)
Immature Granulocytes: 0 %
Lymphocytes Relative: 16 %
Lymphs Abs: 1.7 10*3/uL (ref 0.7–4.0)
MCH: 30.5 pg (ref 26.0–34.0)
MCHC: 34.8 g/dL (ref 30.0–36.0)
MCV: 87.7 fL (ref 80.0–100.0)
Monocytes Absolute: 0.5 10*3/uL (ref 0.1–1.0)
Monocytes Relative: 5 %
Neutro Abs: 8.5 10*3/uL — ABNORMAL HIGH (ref 1.7–7.7)
Neutrophils Relative %: 79 %
Platelets: 299 10*3/uL (ref 150–400)
RBC: 4.79 MIL/uL (ref 3.87–5.11)
RDW: 11.6 % (ref 11.5–15.5)
WBC: 10.8 10*3/uL — ABNORMAL HIGH (ref 4.0–10.5)
nRBC: 0 % (ref 0.0–0.2)

## 2019-10-20 LAB — TROPONIN I (HIGH SENSITIVITY): Troponin I (High Sensitivity): <2 ng/L (ref <18)

## 2019-10-20 LAB — POCT PREGNANCY, URINE: Preg Test, Ur: NEGATIVE

## 2019-10-20 LAB — LIPASE, BLOOD: Lipase: 26 U/L (ref 11–51)

## 2019-10-20 MED ORDER — SODIUM CHLORIDE 0.9 % IV BOLUS
1000.0000 mL | Freq: Once | INTRAVENOUS | Status: AC
  Administered 2019-10-20: 1000 mL via INTRAVENOUS

## 2019-10-20 MED ORDER — IOHEXOL 300 MG/ML  SOLN
100.0000 mL | Freq: Once | INTRAMUSCULAR | Status: AC | PRN
  Administered 2019-10-20: 100 mL via INTRAVENOUS

## 2019-10-20 MED ORDER — DICYCLOMINE HCL 10 MG PO CAPS: 10.0000 mg | ORAL_CAPSULE | Freq: Three times a day (TID) | ORAL | 0 refills | Status: DC | PRN

## 2019-10-20 MED ORDER — PROMETHAZINE HCL 25 MG/ML IJ SOLN
25.0000 mg | Freq: Four times a day (QID) | INTRAMUSCULAR | Status: DC | PRN
  Administered 2019-10-20: 25 mg via INTRAVENOUS
  Filled 2019-10-20: qty 1

## 2019-10-20 MED ORDER — PROMETHAZINE HCL 12.5 MG PO TABS: 12.5000 mg | ORAL_TABLET | Freq: Four times a day (QID) | ORAL | 0 refills | Status: DC | PRN

## 2019-10-20 NOTE — ED Notes (Signed)
EDP at bedside  

## 2019-10-20 NOTE — ED Triage Notes (Addendum)
Patient ambulatory to triage with steady gait, without difficulty or distress noted; pt reports upper abd pain that radiates into back x 3wks accomp by nausea and tachycardia; st here recently but left before seen due to long wait; seen at [facility] and told ?ulcer

## 2019-10-20 NOTE — ED Notes (Signed)
This RN in to check on pt, pt doing well. Pt requesting nausea medication after ultrasound. Noted by this RN.

## 2019-10-20 NOTE — Discharge Instructions (Signed)

## 2019-10-20 NOTE — ED Provider Notes (Signed)
[facility] Emergency Department Provider Note    First MD Initiated Contact with Patient 10/20/19 [phone]     (approximate)  I have reviewed the triage vital signs and the nursing notes.   HISTORY  Chief Complaint Abdominal Pain    HPI Davene B Creedon is a 29 y.o. female below this past medical history presents to the ER for several days of persistent generalized abdominal pain primarily epigastric but some lower abdominal pain persistent nausea decreased p.o. intake.  Feels like she is having chills but no measured temperature at home.  Was just evaluated at [facility] for similar symptoms had CT imaging that showed evidence of possible early appendicitis but did not have any white count was evaluated surgery not felt to have evidence of acute appendicitis at that time.  She was discharged home with PPI and treatment for possible PUD or gastritis.  Feels like she is not getting any improvement presents today.    Past Medical History:  Diagnosis Date  . Anxiety   . GERD (gastroesophageal reflux disease)    occ-tums prn  . Ovarian cyst   . Tachycardia    PT STATES THAT OCC SHE WILL FEEL HER HR GET REALLY FAST AND FEELS LIKE SHE CANT GET HER BREATH-ALL OF THIS RESOLVES WITHIN A COUPLE OF SECONDS  . Tobacco use    quit 2020   Family History  Problem Relation Age of Onset  . Breast cancer Neg Hx   . Cancer Neg Hx   . Heart failure Neg Hx   . Diabetes Neg Hx   . Stroke Neg Hx   . Hypertension Neg Hx    Past Surgical History:  Procedure Laterality Date  . CESAREAN SECTION    . CHOLECYSTECTOMY    . INDUCED ABORTION    . LEEP N/A 07/28/2017   Procedure: LOOP ELECTROSURGICAL EXCISION PROCEDURE (LEEP);  Surgeon: [name], MD;  Location: [facility] ORS;  Service: Gynecology;  Laterality: N/A;   Patient Active Problem List   Diagnosis Date Noted  . Dysplasia of cervix, high grade CIN 2 07/12/2019      Prior to Admission medications   Medication  Sig Start Date End Date Taking? Authorizing Provider  dicyclomine (BENTYL) 10 MG capsule Take 1 capsule (10 mg total) by mouth 3 (three) times daily as needed for up to 14 days for spasms. 10/20/19 11/03/19  Willy Eddy, MD  etonogestrel (NEXPLANON) 68 MG IMPL implant 1 each by Subdermal route once.    [provider]  promethazine (PHENERGAN) 12.5 MG tablet Take 1 tablet (12.5 mg total) by mouth every 6 (six) hours as needed. 10/20/19   Willy Eddy, MD    Allergies Latex    Social History Social History   Tobacco Use  . Smoking status: Current Every Day Smoker    Packs/day: 0.50    Years: 11.00    Pack years: 5.50    Types: Cigarettes  . Smokeless tobacco: Never Used  Vaping Use  . Vaping Use: Former  Substance Use Topics  . Alcohol use: No  . Drug use: No    Review of Systems Patient denies headaches, rhinorrhea, blurry vision, numbness, shortness of breath, chest pain, edema, cough, abdominal pain, nausea, vomiting, diarrhea, dysuria, fevers, rashes or hallucinations unless otherwise stated above in HPI. ____________________________________________   PHYSICAL EXAM:  VITAL SIGNS: Vitals:   10/20/19 0857 10/20/19 0900  BP:  122/71  Pulse:  (!) 109  Resp:    Temp: 98.3  F (36.8 C)   SpO2:  99%    Constitutional: Alert and oriented.  Eyes: Conjunctivae are normal.  Head: Atraumatic. Nose: No congestion/rhinnorhea. Mouth/Throat: Mucous membranes are moist.   Neck: No stridor. Painless ROM.  Cardiovascular: Normal rate, regular rhythm. Grossly normal heart sounds.  Good peripheral circulation. Respiratory: Normal respiratory effort.  No retractions. Lungs CTAB. Gastrointestinal: Soft, generalized ttp without rebound or guarding.. No distention. No abdominal bruits. No CVA tenderness. Genitourinary:  Musculoskeletal: No lower extremity tenderness nor edema.  No joint effusions. Neurologic:  Normal speech and language. No gross focal neurologic  deficits are appreciated. No facial droop Skin:  Skin is warm, dry and intact. No rash noted. Psychiatric: Mood and affect are normal. Speech and behavior are normal.  ____________________________________________   LABS (all labs ordered are listed, but only abnormal results are displayed)  Results for orders placed or performed during the hospital encounter of 10/20/19 (from the past 24 hour(s))  CBC with Differential     Status: Abnormal   Collection Time: 10/20/19  5:55 AM  Result Value Ref Range   WBC 10.8 (H) 4.0 - 10.5 K/uL   RBC 4.79 3.87 - 5.11 MIL/uL   Hemoglobin 14.6 12.0 - 15.0 g/dL   HCT 51.0 36 - 46 %   MCV 87.7 80.0 - 100.0 fL   MCH 30.5 26.0 - 34.0 pg   MCHC 34.8 30.0 - 36.0 g/dL   RDW 25.8 52.7 - 78.2 %   Platelets 299 150 - 400 K/uL   nRBC 0.0 0.0 - 0.2 %   Neutrophils Relative % 79 %   Neutro Abs 8.5 (H) 1.7 - 7.7 K/uL   Lymphocytes Relative 16 %   Lymphs Abs 1.7 0.7 - 4.0 K/uL   Monocytes Relative 5 %   Monocytes Absolute 0.5 0 - 1 K/uL   Eosinophils Relative 0 %   Eosinophils Absolute 0.0 0 - 0 K/uL   Basophils Relative 0 %   Basophils Absolute 0.0 0 - 0 K/uL   Immature Granulocytes 0 %   Abs Immature Granulocytes 0.03 0.00 - 0.07 K/uL  Comprehensive metabolic panel     Status: Abnormal   Collection Time: 10/20/19  5:55 AM  Result Value Ref Range   Sodium 139 135 - 145 mmol/L   Potassium 3.3 (L) 3.5 - 5.1 mmol/L   Chloride 104 98 - 111 mmol/L   CO2 22 22 - 32 mmol/L   Glucose, Bld 91 70 - 99 mg/dL   BUN 9 6 - 20 mg/dL   Creatinine, Ser 4.23 0.44 - 1.00 mg/dL   Calcium 8.9 8.9 - 53.6 mg/dL   Total Protein 7.9 6.5 - 8.1 g/dL   Albumin 4.3 3.5 - 5.0 g/dL   AST 17 15 - 41 U/L   ALT 22 0 - 44 U/L   Alkaline Phosphatase 47 38 - 126 U/L   Total Bilirubin 1.0 0.3 - 1.2 mg/dL   GFR calc non Af Amer >60 >60 mL/min   GFR calc Af Amer >60 >60 mL/min   Anion gap 13 5 - 15  Lipase, blood     Status: None   Collection Time: 10/20/19  5:55 AM  Result Value  Ref Range   Lipase 26 11 - 51 U/L  Troponin I (High Sensitivity)     Status: None   Collection Time: 10/20/19  5:55 AM  Result Value Ref Range   Troponin I (High Sensitivity) <2 <18 ng/L  Urinalysis, Complete w Microscopic  Status: Abnormal   Collection Time: 10/20/19  5:56 AM  Result Value Ref Range   Color, Urine RED (A) YELLOW   APPearance CLOUDY (A) CLEAR   Specific Gravity, Urine >1.046 (H) 1.005 - 1.030   pH 5.0 5.0 - 8.0   Glucose, UA NEGATIVE NEGATIVE mg/dL   Hgb urine dipstick LARGE (A) NEGATIVE   Bilirubin Urine NEGATIVE NEGATIVE   Ketones, ur 80 (A) NEGATIVE mg/dL   Protein, ur 628 (A) NEGATIVE mg/dL   Nitrite NEGATIVE NEGATIVE   Leukocytes,Ua NEGATIVE NEGATIVE   RBC / HPF >50 (H) 0 - 5 RBC/hpf   WBC, UA 0-5 0 - 5 WBC/hpf   Bacteria, UA NONE SEEN NONE SEEN   Squamous Epithelial / LPF 0-5 0 - 5  Pregnancy, urine POC     Status: None   Collection Time: 10/20/19  5:58 AM  Result Value Ref Range   Preg Test, Ur NEGATIVE NEGATIVE   ____________________________________________  EKG My review and personal interpretation at Time:    5:51 indication: epigastric pain  Rate: 85  Rhythm: sinus Axis: normal Other: normal intervals, no stemi ____________________________________________  RADIOLOGY  I personally reviewed all radiographic images ordered to evaluate for the above acute complaints and reviewed radiology reports and findings.  These findings were personally discussed with the patient.  Please see medical record for radiology report.  ____________________________________________   PROCEDURES  Procedure(s) performed:  Procedures    Critical Care performed: no ____________________________________________   INITIAL IMPRESSION / ASSESSMENT AND PLAN / ED COURSE  Pertinent labs & imaging results that were available during my care of the patient were reviewed by me and considered in my medical decision making (see chart for details).   DDX: Enteritis,  gastritis, appendicitis, pancreatitis, biliary pathology, ovarian cyst, torsion, UTI, IBS  Ladaija B Lichtman is a 29 y.o. who presents to the ED with symptoms as described above.  Patient is well-appearing nontoxic mildly tachycardic but otherwise well perfused.  On review of her records does show that she was just at [facility] with CT imaging reporting possible early appendicitis and today has mild increase in her white count but no fever.  Given these findings will order CT imaging.  She not having significant right lower quadrant pain or any signs of guarding or rebound is kind of diffusely with some tenderness.  Will provide IV fluids.  Seems very anxious and primary complaint is nausea therefore will order IV Phenergan and reassess.  Clinical Course as of Oct 19 1333  Thu Oct 20, 2019  1331 Patient reassessed.  Patient remains well-appearing nontoxic with benign abdominal exam.  Felt significant improvement after Phenergan.  She is fairly anxious appearing.  I do not see any indication for hospitalization at this time but will give referral to outpatient clinics and we discussed signs and symptoms for which she should return to the ER.   [PR]    Clinical Course User Index [PR] Willy Eddy, MD    The patient was evaluated in Emergency Department today for the symptoms described in the history of present illness. He/she was evaluated in the context of the global COVID-19 pandemic, which necessitated consideration that the patient might be at risk for infection with the SARS-CoV-2 virus that causes COVID-19. Institutional protocols and algorithms that pertain to the evaluation of patients at risk for COVID-19 are in a state of rapid change based on information released by regulatory bodies including the CDC and federal and state organizations. These policies and algorithms were  followed during the patient's care in the ED.  As part of my medical decision making, I reviewed the following data  within the electronic MEDICAL RECORD NUMBER Nursing notes reviewed and incorporated, Labs reviewed, notes from prior ED visits and [place] Controlled Substance Database   ____________________________________________   FINAL CLINICAL IMPRESSION(S) / ED DIAGNOSES  Final diagnoses:  Epigastric pain      NEW MEDICATIONS STARTED DURING THIS VISIT:  New Prescriptions   DICYCLOMINE (BENTYL) 10 MG CAPSULE    Take 1 capsule (10 mg total) by mouth 3 (three) times daily as needed for up to 14 days for spasms.   PROMETHAZINE (PHENERGAN) 12.5 MG TABLET    Take 1 tablet (12.5 mg total) by mouth every 6 (six) hours as needed.     Note:  This document was prepared using Dragon voice recognition software and may include unintentional dictation errors.    Willy Eddy, MD 10/20/19 1335

## 2019-10-21 ENCOUNTER — Encounter: Payer: Self-pay | Admitting: Gastroenterology

## 2019-10-26 ENCOUNTER — Other Ambulatory Visit: Payer: Self-pay

## 2019-10-26 ENCOUNTER — Other Ambulatory Visit
Admission: RE | Admit: 2019-10-26 | Discharge: 2019-10-26 | Disposition: A | Discharge status: Home / Self Care | Payer: Medicaid Other | Source: Ambulatory Visit | Attending: Gastroenterology | Admitting: Gastroenterology

## 2019-10-26 DIAGNOSIS — Z20822 Contact with and (suspected) exposure to covid-19: Secondary | ICD-10-CM | POA: Insufficient documentation

## 2019-10-26 DIAGNOSIS — Z01812 Encounter for preprocedural laboratory examination: Secondary | ICD-10-CM | POA: Diagnosis not present

## 2019-10-26 LAB — SARS CORONAVIRUS 2 (TAT 6-24 HRS): SARS Coronavirus 2: NEGATIVE

## 2019-10-27 ENCOUNTER — Encounter: Payer: Self-pay | Admitting: *Deleted

## 2019-10-28 ENCOUNTER — Ambulatory Visit: Payer: Medicaid Other | Admitting: Anesthesiology

## 2019-10-28 ENCOUNTER — Other Ambulatory Visit: Payer: Self-pay

## 2019-10-28 ENCOUNTER — Encounter: Payer: Self-pay | Admitting: *Deleted

## 2019-10-28 ENCOUNTER — Encounter
Admission: RE | Disposition: A | Discharge status: Home / Self Care | Payer: Self-pay | Source: Home / Self Care | Attending: Gastroenterology

## 2019-10-28 ENCOUNTER — Ambulatory Visit
Admission: RE | Admit: 2019-10-28 | Discharge: 2019-10-28 | Disposition: A | Discharge status: Home / Self Care | Payer: Medicaid Other | Attending: Gastroenterology | Admitting: Gastroenterology

## 2019-10-28 DIAGNOSIS — Z91040 Latex allergy status: Secondary | ICD-10-CM | POA: Insufficient documentation

## 2019-10-28 DIAGNOSIS — Z793 Long term (current) use of hormonal contraceptives: Secondary | ICD-10-CM | POA: Insufficient documentation

## 2019-10-28 DIAGNOSIS — Z6832 Body mass index (BMI) 32.0-32.9, adult: Secondary | ICD-10-CM | POA: Insufficient documentation

## 2019-10-28 DIAGNOSIS — Z79899 Other long term (current) drug therapy: Secondary | ICD-10-CM | POA: Diagnosis not present

## 2019-10-28 DIAGNOSIS — Z9049 Acquired absence of other specified parts of digestive tract: Secondary | ICD-10-CM | POA: Diagnosis not present

## 2019-10-28 DIAGNOSIS — Z87891 Personal history of nicotine dependence: Secondary | ICD-10-CM | POA: Diagnosis not present

## 2019-10-28 DIAGNOSIS — R634 Abnormal weight loss: Secondary | ICD-10-CM | POA: Diagnosis not present

## 2019-10-28 DIAGNOSIS — R110 Nausea: Secondary | ICD-10-CM | POA: Diagnosis present

## 2019-10-28 DIAGNOSIS — K2950 Unspecified chronic gastritis without bleeding: Secondary | ICD-10-CM | POA: Insufficient documentation

## 2019-10-28 DIAGNOSIS — K219 Gastro-esophageal reflux disease without esophagitis: Secondary | ICD-10-CM | POA: Insufficient documentation

## 2019-10-28 DIAGNOSIS — R1084 Generalized abdominal pain: Secondary | ICD-10-CM | POA: Diagnosis present

## 2019-10-28 HISTORY — PX: ESOPHAGOGASTRODUODENOSCOPY (EGD) WITH PROPOFOL: SHX5813

## 2019-10-28 LAB — POCT PREGNANCY, URINE: Preg Test, Ur: NEGATIVE

## 2019-10-28 SURGERY — ESOPHAGOGASTRODUODENOSCOPY (EGD) WITH PROPOFOL
Anesthesia: General

## 2019-10-28 MED ORDER — PROPOFOL 500 MG/50ML IV EMUL
INTRAVENOUS | Status: AC
  Filled 2019-10-28: qty 50

## 2019-10-28 MED ORDER — PROPOFOL 10 MG/ML IV BOLUS
INTRAVENOUS | Status: DC | PRN
  Administered 2019-10-28: 150 mg via INTRAVENOUS
  Administered 2019-10-28: 100 mg via INTRAVENOUS

## 2019-10-28 MED ORDER — LIDOCAINE HCL (CARDIAC) PF 100 MG/5ML IV SOSY
PREFILLED_SYRINGE | INTRAVENOUS | Status: DC | PRN
  Administered 2019-10-28: 30 mg via INTRAVENOUS

## 2019-10-28 MED ORDER — SODIUM CHLORIDE 0.9 % IV SOLN: INTRAVENOUS | Status: DC

## 2019-10-28 MED ORDER — PROPOFOL 500 MG/50ML IV EMUL
INTRAVENOUS | Status: DC | PRN
Start: 2019-10-28 — End: 2019-10-28
  Administered 2019-10-28: 150 ug/kg/min via INTRAVENOUS

## 2019-10-28 NOTE — H&P (Signed)
Outpatient short stay form Pre-procedure 10/28/2019 8:59 AM Merlyn Lot MD, MPH  Primary Physician: PA Copland  Reason for visit:  Abdominal Pain, dyspepsia  History of present illness:   29 y/o lady with several month history of dyspeptic symptoms, weight loss, and abdominal pain. No blood thinners, no family history of GI malignancies. History of cholecystectomy and emergent c-section.    Current Facility-Administered Medications:  .  0.9 %  sodium chloride infusion, , Intravenous, Continuous, Sathvika Ojo, Rossie Muskrat, MD, Last Rate: 20 mL/hr at 10/28/19 0815, New Bag at 10/28/19 0815  Medications Prior to Admission  Medication Sig Dispense Refill Last Dose  . pantoprazole (PROTONIX) 20 MG tablet Take 20 mg by mouth daily.   10/27/2019 at Unknown time  . sucralfate (CARAFATE) 1 g tablet Take 1 g by mouth 4 (four) times daily -  with meals and at bedtime.   10/27/2019 at Unknown time  . dicyclomine (BENTYL) 10 MG capsule Take 1 capsule (10 mg total) by mouth 3 (three) times daily as needed for up to 14 days for spasms. (Patient not taking: Reported on 10/28/2019) 16 capsule 0 Not Taking at Unknown time  . etonogestrel (NEXPLANON) 68 MG IMPL implant 1 each by Subdermal route once.     . promethazine (PHENERGAN) 12.5 MG tablet Take 1 tablet (12.5 mg total) by mouth every 6 (six) hours as needed. (Patient not taking: Reported on 10/28/2019) 12 tablet 0 Not Taking at Unknown time     Allergies  Allergen Reactions  . Latex Swelling and Rash     Past Medical History:  Diagnosis Date  . Anxiety   . GERD (gastroesophageal reflux disease)    occ-tums prn  . Ovarian cyst   . Tachycardia    PT STATES THAT OCC SHE WILL FEEL HER HR GET REALLY FAST AND FEELS LIKE SHE CANT GET HER BREATH-ALL OF THIS RESOLVES WITHIN A COUPLE OF SECONDS  . Tobacco use    quit 2020    Review of systems:  Otherwise negative.    Physical Exam  Gen: Alert, oriented. Appears stated age.  HEENT: [place]/AT.  PERRLA. Lungs: no respiratory distress Abd: soft, benign, no masses. BS+ Ext: No edema. Pulses 2+    Planned procedures: Proceed with EGD. The patient understands the nature of the planned procedure, indications, risks, alternatives and potential complications including but not limited to bleeding, infection, perforation, damage to internal organs and possible oversedation/side effects from anesthesia. The patient agrees and gives consent to proceed.  Please refer to procedure notes for findings, recommendations and patient disposition/instructions.     Merlyn Lot MD, MPH Gastroenterology 10/28/2019  8:59 AM

## 2019-10-28 NOTE — Transfer of Care (Signed)
Immediate Anesthesia Transfer of Care Note  Patient: Emily Nash  Procedure(s) Performed: ESOPHAGOGASTRODUODENOSCOPY (EGD) WITH PROPOFOL (N/A )  Patient Location: PACU and Endoscopy Unit  Anesthesia Type:MAC  Level of Consciousness: sedated  Airway & Oxygen Therapy: Patient Spontanous Breathing and Patient connected to nasal cannula oxygen  Post-op Assessment: Report given to RN and Post -op Vital signs reviewed and stable  Post vital signs: Reviewed and stable  Last Vitals:  Vitals Value Taken Time  BP 97/63 10/28/19 0918  Temp 35.6 C 10/28/19 0919  Pulse 82 10/28/19 0919  Resp 19 10/28/19 0919  SpO2 95 % 10/28/19 0919  Vitals shown include unvalidated device data.  Last Pain:  Vitals:   10/28/19 0919  TempSrc: Temporal  PainSc:          Complications: No complications documented.

## 2019-10-28 NOTE — Op Note (Signed)
[facility] Gastroenterology Patient Name: Emily Nash Procedure Date: 10/28/2019 8:35 AM MRN: [id number] Account #: [id number] Date of Birth: Jul 21, 1990 Admit Type: Outpatient Age: 29 Room: [facility] ENDO ROOM 3 Gender: Female Note Status: Finalized Procedure:             Upper GI endoscopy Indications:           Generalized abdominal pain, Nausea, Weight loss Providers:             Andrey Farmer MD, MD Medicines:             Monitored Anesthesia Care Complications:         No immediate complications. Estimated blood loss:                         Minimal. Procedure:             Pre-Anesthesia Assessment:                        - Prior to the procedure, a History and Physical was                         performed, and patient medications and allergies were                         reviewed. The patient is competent. The risks and                         benefits of the procedure and the sedation options and                         risks were discussed with the patient. All questions                         were answered and informed consent was obtained.                         Patient identification and proposed procedure were                         verified by the physician, the nurse, the anesthetist                         and the technician in the endoscopy suite. Mental                         Status Examination: alert and oriented. Airway                         Examination: normal oropharyngeal airway and neck                         mobility. Respiratory Examination: clear to                         auscultation. CV Examination: normal. Prophylactic                         Antibiotics: The patient does not require prophylactic  antibiotics. Prior Anticoagulants: The patient has                         taken no previous anticoagulant or antiplatelet                         agents. ASA Grade Assessment: I - A normal, healthy                          patient. After reviewing the risks and benefits, the                         patient was deemed in satisfactory condition to                         undergo the procedure. The anesthesia plan was to use                         monitored anesthesia care (MAC). Immediately prior to                         administration of medications, the patient was                         re-assessed for adequacy to receive sedatives. The                         heart rate, respiratory rate, oxygen saturations,                         blood pressure, adequacy of pulmonary ventilation, and                         response to care were monitored throughout the                         procedure. The physical status of the patient was                         re-assessed after the procedure.                        After obtaining informed consent, the endoscope was                         passed under direct vision. Throughout the procedure,                         the patient's blood pressure, pulse, and oxygen                         saturations were monitored continuously. The Endoscope                         was introduced through the mouth, and advanced to the                         second part of duodenum. The upper GI endoscopy was  accomplished without difficulty. The patient tolerated                         the procedure well. Findings:      The examined esophagus was normal.      Striped mildly erythematous mucosa without bleeding was found in the       gastric antrum. Biopsies were taken with a cold forceps for Helicobacter       pylori testing. Estimated blood loss was minimal.      The examined duodenum was normal. Impression:            - Normal esophagus.                        - Erythematous mucosa in the antrum. Biopsied.                        - Normal examined duodenum. Recommendation:        - Discharge patient to home.                        - Resume  previous diet.                        - Continue present medications.                        - Await pathology results.                        - Return to referring physician as previously                         scheduled. Procedure Code(s):     --- Professional ---                        [phone], Esophagogastroduodenoscopy, flexible,                         transoral; with biopsy, single or multiple Diagnosis Code(s):     --- Professional ---                        K31.89, Other diseases of stomach and duodenum                        R10.84, Generalized abdominal pain                        R11.0, Nausea                        R63.4, Abnormal weight loss CPT copyright 2019 American Medical Association. All rights reserved. The codes documented in this report are preliminary and upon coder review may  be revised to meet current compliance requirements. Andrey Farmer, MD Andrey Farmer MD, MD 10/28/2019 9:20:37 AM Number of Addenda: 0 Note Initiated On: 10/28/2019 8:35 AM Estimated Blood Loss:  Estimated blood loss was minimal.      [facility]

## 2019-10-28 NOTE — Interval H&P Note (Signed)
History and Physical Interval Note:  10/28/2019 9:02 AM  Emily Nash  has presented today for surgery, with the diagnosis of EPIG ABD PAIN.  The various methods of treatment have been discussed with the patient and family. After consideration of risks, benefits and other options for treatment, the patient has consented to  Procedure(s): ESOPHAGOGASTRODUODENOSCOPY (EGD) WITH PROPOFOL (N/A) as a surgical intervention.  The patient's history has been reviewed, patient examined, no change in status, stable for surgery.  I have reviewed the patient's chart and labs.  Questions were answered to the patient's satisfaction.     [name]  Ok to proceed with EGD

## 2019-10-28 NOTE — Anesthesia Postprocedure Evaluation (Signed)
Anesthesia Post Note  Patient: Emily Nash  Procedure(s) Performed: ESOPHAGOGASTRODUODENOSCOPY (EGD) WITH PROPOFOL (N/A )  Patient location during evaluation: Endoscopy Anesthesia Type: General Level of consciousness: awake and alert Pain management: pain level controlled Vital Signs Assessment: post-procedure vital signs reviewed and stable Respiratory status: spontaneous breathing, nonlabored ventilation, respiratory function stable and patient connected to nasal cannula oxygen Cardiovascular status: blood pressure returned to baseline and stable Postop Assessment: no apparent nausea or vomiting Anesthetic complications: no   No complications documented.   Last Vitals:  Vitals:   10/28/19 0939 10/28/19 0949  BP: 99/75 117/82  Pulse: 79 79  Resp: 12 12  Temp:    SpO2: 100% 100%    Last Pain:  Vitals:   10/28/19 0949  TempSrc:   PainSc: 0-No pain                 Lenard Simmer

## 2019-10-28 NOTE — Anesthesia Procedure Notes (Signed)
Procedure Name: MAC Date/Time: 10/28/2019 9:15 AM Performed by: Genevie Ann, CRNA Laryngoscope Size: 5

## 2019-10-28 NOTE — Anesthesia Preprocedure Evaluation (Signed)
Anesthesia Evaluation  Patient identified by MRN, date of birth, ID band Patient awake    Reviewed: Allergy & Precautions, H&P , NPO status , Patient's Chart, lab work & pertinent test results  History of Anesthesia Complications Negative for: history of anesthetic complications  Airway Mallampati: III  TM Distance: <3 FB Neck ROM: full    Dental  (+) Chipped, Poor Dentition, Dental Advidsory Given   Pulmonary neg shortness of breath, neg recent URI, Current Smoker, former smoker,           Cardiovascular Exercise Tolerance: Good (-) hypertension(-) angina+ dysrhythmias (-) Valvular Problems/Murmurs     Neuro/Psych negative neurological ROS  negative psych ROS   GI/Hepatic Neg liver ROS, GERD  Medicated and Controlled,  Endo/Other  negative endocrine ROS  Renal/GU negative Renal ROS  Female GU complaint     Musculoskeletal negative musculoskeletal ROS (+)   Abdominal   Peds  Hematology negative hematology ROS (+)   Anesthesia Other Findings Past Medical History: No date: GERD (gastroesophageal reflux disease)     Comment:  occ-tums prn No date: Ovarian cyst No date: Tachycardia     Comment:  PT STATES THAT OCC SHE WILL FEEL HER HR GET REALLY FAST               AND FEELS LIKE SHE CANT GET HER BREATH-ALL OF THIS               RESOLVES WITHIN A COUPLE OF SECONDS No date: Tobacco use  Reproductive/Obstetrics negative OB ROS                             Anesthesia Physical  Anesthesia Plan  ASA: II  Anesthesia Plan: General   Post-op Pain Management:    Induction: Intravenous  PONV Risk Score and Plan: Propofol infusion and TIVA  Airway Management Planned: Natural Airway and Nasal Cannula  Additional Equipment:   Intra-op Plan:   Post-operative Plan:   Informed Consent: I have reviewed the patients History and Physical, chart, labs and discussed the procedure including  the risks, benefits and alternatives for the proposed anesthesia with the patient or authorized representative who has indicated his/her understanding and acceptance.     Dental advisory given  Plan Discussed with: Surgeon and CRNA  Anesthesia Plan Comments: (Patient consented for risks of anesthesia including but not limited to:  - adverse reactions to medications - damage to teeth, lips or other oral mucosa - sore throat or hoarseness - Damage to heart, brain, lungs or loss of life  Patient voiced understanding.)        Anesthesia Quick Evaluation

## 2019-10-31 ENCOUNTER — Encounter: Payer: Self-pay | Admitting: Gastroenterology

## 2019-10-31 ENCOUNTER — Ambulatory Visit: Payer: Self-pay | Admitting: Obstetrics and Gynecology

## 2019-10-31 LAB — SURGICAL PATHOLOGY

## 2019-11-01 DIAGNOSIS — R1011 Right upper quadrant pain: Secondary | ICD-10-CM | POA: Insufficient documentation

## 2019-11-01 DIAGNOSIS — R634 Abnormal weight loss: Secondary | ICD-10-CM | POA: Insufficient documentation

## 2019-11-14 ENCOUNTER — Telehealth: Payer: Self-pay | Admitting: Obstetrics and Gynecology

## 2019-11-14 ENCOUNTER — Ambulatory Visit (INDEPENDENT_AMBULATORY_CARE_PROVIDER_SITE_OTHER): Payer: Medicaid Other | Admitting: Obstetrics and Gynecology

## 2019-11-14 ENCOUNTER — Other Ambulatory Visit: Payer: Self-pay

## 2019-11-14 ENCOUNTER — Encounter: Payer: Self-pay | Admitting: Obstetrics and Gynecology

## 2019-11-14 VITALS — BP 110/70 | Ht 62.0 in | Wt 171.6 lb

## 2019-11-14 DIAGNOSIS — N83202 Unspecified ovarian cyst, left side: Secondary | ICD-10-CM | POA: Diagnosis not present

## 2019-11-14 DIAGNOSIS — N871 Moderate cervical dysplasia: Secondary | ICD-10-CM | POA: Diagnosis not present

## 2019-11-14 NOTE — Progress Notes (Signed)
Obstetrics & Gynecology Office Visit   Chief Complaint  Patient presents with  . Gynecologic Exam    09/16/19 ER Cyst on her left ovary was found.    History of Present Illness: 29 y.o. [phone] female who presents in follow up from an ER visit on 10/17/2019.  She presented and a 4 cm cyst was noted.  Some "prominent, mildly hyperenhancing appendix without surrounding inflammatory changes cannot rule out early appendicitis."  The previous was from the CT report at [facility].  On 10/20/19 she went to the ER and a 5 cm cyst was noted. A CT scan noted a normal appendix.  She is having a lot of left sided cramping and she also notes tingling on her left left.  She went to her PCP who checked her for vitamin deficiencies.  The pain is worse when she is on her period.  She cramps on both sides. Her left side is worse.  Alleviating factors: none. Aggravating factors: none. Associated symptoms: pain down left side.    Her periods have been essentially normal.  However, in early July she had some unexpected bleeding. She is using nothing for contraception.  She still has Nexplanon since 01/2013.    She separately notes a loss of 20+ pounds in ~3 weeks. She has had an upper endoscopy, which was essentially normal (maybe mild gastritis).  She has been worked up by GI and nothing has been found so far.   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  She is due for a colposcopy.     Past Medical History:  Diagnosis Date  . Anxiety   . GERD (gastroesophageal reflux disease)    occ-tums prn  . Ovarian cyst   . Tachycardia    PT STATES THAT OCC SHE WILL FEEL HER HR GET REALLY FAST AND FEELS LIKE SHE CANT GET HER BREATH-ALL OF THIS RESOLVES WITHIN A COUPLE OF SECONDS  . Tobacco use    quit 2020    Past Surgical History:  Procedure Laterality Date  . CESAREAN SECTION    . CHOLECYSTECTOMY    . ESOPHAGOGASTRODUODENOSCOPY (EGD) WITH PROPOFOL N/A 10/28/2019   Procedure:  ESOPHAGOGASTRODUODENOSCOPY (EGD) WITH PROPOFOL;  Surgeon: [name], MD;  Location: [facility] ENDOSCOPY;  Service: Endoscopy;  Laterality: N/A;  . gallbladder remove    . INDUCED ABORTION    . LEEP N/A 07/28/2017   Procedure: LOOP ELECTROSURGICAL EXCISION PROCEDURE (LEEP);  Surgeon: [name], MD;  Location: [facility] ORS;  Service: Gynecology;  Laterality: N/A;    Gynecologic History: Patient's last menstrual period was 10/20/2019.  Obstetric History: [id number]  Family History  Problem Relation Age of Onset  . Breast cancer Neg Hx   . Cancer Neg Hx   . Heart failure Neg Hx   . Diabetes Neg Hx   . Stroke Neg Hx   . Hypertension Neg Hx     Social History   Socioeconomic History  . Marital status: Single    Spouse name: Not on file  . Number of children: Not on file  . Years of education: Not on file  . Highest education level: Not on file  Occupational History  . Not on file  Tobacco Use  . Smoking status: Former Smoker    Packs/day: 0.50    Years: 11.00    Pack years: 5.50    Types: Cigarettes    Quit date: 05/23/2018    Years since quitting: 1.4  . Smokeless tobacco: Never  Used  Vaping Use  . Vaping Use: Former  Substance and Sexual Activity  . Alcohol use: No  . Drug use: Not Currently    Comment: last use 08/2019  . Sexual activity: Not Currently    Birth control/protection: Implant  Other Topics Concern  . Not on file  Social History Narrative  . Not on file   Social Determinants of Health   Financial Resource Strain:   . Difficulty of Paying Living Expenses: Not on file  Food Insecurity:   . Worried About Programme researcher, broadcasting/film/video in the Last Year: Not on file  . Ran Out of Food in the Last Year: Not on file  Transportation Needs:   . Lack of Transportation (Medical): Not on file  . Lack of Transportation (Non-Medical): Not on file  Physical Activity:   . Days of Exercise per Week: Not on file  . Minutes of Exercise per Session: Not on file    Stress:   . Feeling of Stress : Not on file  Social Connections:   . Frequency of Communication with Friends and Family: Not on file  . Frequency of Social Gatherings with Friends and Family: Not on file  . Attends Religious Services: Not on file  . Active Member of Clubs or Organizations: Not on file  . Attends Banker Meetings: Not on file  . Marital Status: Not on file  Intimate Partner Violence:   . Fear of Current or Ex-Partner: Not on file  . Emotionally Abused: Not on file  . Physically Abused: Not on file  . Sexually Abused: Not on file    Allergies  Allergen Reactions  . Latex Swelling and Rash    Prior to Admission medications   Medication Sig Start Date End Date Taking? Authorizing Provider  dicyclomine (BENTYL) 10 MG capsule Take 1 capsule (10 mg total) by mouth 3 (three) times daily as needed for up to 14 days for spasms. Patient not taking: Reported on 10/28/2019 10/20/19 11/03/19  Willy Eddy, MD  etonogestrel (NEXPLANON) 68 MG IMPL implant 1 each by Subdermal route once.    [provider]  promethazine (PHENERGAN) 12.5 MG tablet Take 1 tablet (12.5 mg total) by mouth every 6 (six) hours as needed. Patient not taking: Reported on 10/28/2019 10/20/19   Willy Eddy, MD    Review of Systems  Constitutional: Negative.   HENT: Negative.   Eyes: Negative.   Respiratory: Negative.   Cardiovascular: Negative.   Gastrointestinal: Positive for abdominal pain. Negative for blood in stool, constipation, diarrhea, heartburn, melena, nausea and vomiting.  Genitourinary: Negative.   Musculoskeletal: Negative.   Skin: Negative.   Neurological: Negative.   Psychiatric/Behavioral: Negative.      Physical Exam BP 110/70   Ht 5\' 2"  (1.575 m)   Wt 171 lb 9.6 oz (77.8 kg)   LMP 10/20/2019   BMI 31.39 kg/m  Patient's last menstrual period was 10/20/2019. Physical Exam Constitutional:      General: She is not in acute distress.    Appearance:  Normal appearance.  HENT:     Head: Normocephalic and atraumatic.  Eyes:     General: No scleral icterus.    Conjunctiva/sclera: Conjunctivae normal.  Neurological:     General: No focal deficit present.     Mental Status: She is alert and oriented to person, place, and time.     Cranial Nerves: No cranial nerve deficit.  Psychiatric:        Mood and Affect: Mood  normal.        Behavior: Behavior normal.        Judgment: Judgment normal.    Female chaperone present for pelvic and breast  portions of the physical exam  Assessment: 29 y.o. [id number] female here for  1. Left ovarian cyst   2. Dysplasia of cervix, high grade CIN 2      Plan: Problem List Items Addressed This Visit      Genitourinary   Dysplasia of cervix, high grade CIN 2    Other Visit Diagnoses    Left ovarian cyst    -  Primary   Relevant Orders   US PELVIS TRANSVAGINAL NON-OB (TV ONLY)     Left ovarian cyst: We discussed the findings of her left ovarian cyst.  She still has some mild discomfort.  The cyst appears simple and a follow-up ultrasound is probably recommended given her ongoing symptoms.  She is agreeable to wait until 8 to 12 weeks has passed from her initial ultrasound.  We discussed that if her symptoms worsen and become severe, including; unrelenting pain or any other concerning symptoms, ovarian torsion may be occurring and she should present to the emergency room.  If her pain improves, we can check another ultrasound in another month.  After that we will discuss further management.  CIN-2: Based on the ASC CP guidelines, she would be due for colposcopy.  She also has a Nexplanon since 2014, which she would like to replace.  We will schedule her for colposcopy and removal and replacement of her Nexplanon on the same day.  Her ultrasound, colposcopy, and Nexplanon replacement can be accomplished at her next appointment.  A total of 39 minutes were spent face-to-face with the patient as well as  preparation, review, communication, and documentation during this encounter.    Thomasene Mohair, MD 11/14/2019 12:19 PM

## 2019-11-14 NOTE — Telephone Encounter (Signed)
Patient scheduled 9/22 for nexplanon replacement with [facility] KP.

## 2019-11-16 ENCOUNTER — Encounter: Payer: Self-pay | Admitting: Obstetrics and Gynecology

## 2019-11-16 NOTE — Telephone Encounter (Signed)
Noted. Will order to arrive by apt date/time. 

## 2019-11-19 ENCOUNTER — Other Ambulatory Visit: Payer: Self-pay

## 2019-11-19 ENCOUNTER — Emergency Department: Payer: Medicaid Other

## 2019-11-19 DIAGNOSIS — R1084 Generalized abdominal pain: Secondary | ICD-10-CM | POA: Diagnosis not present

## 2019-11-19 DIAGNOSIS — G8929 Other chronic pain: Secondary | ICD-10-CM | POA: Diagnosis not present

## 2019-11-19 DIAGNOSIS — F419 Anxiety disorder, unspecified: Secondary | ICD-10-CM | POA: Diagnosis not present

## 2019-11-19 DIAGNOSIS — Z87891 Personal history of nicotine dependence: Secondary | ICD-10-CM | POA: Insufficient documentation

## 2019-11-19 DIAGNOSIS — R0602 Shortness of breath: Secondary | ICD-10-CM | POA: Diagnosis present

## 2019-11-19 DIAGNOSIS — U071 COVID-19: Secondary | ICD-10-CM | POA: Diagnosis not present

## 2019-11-19 LAB — COMPREHENSIVE METABOLIC PANEL
ALT: 61 U/L — ABNORMAL HIGH (ref 0–44)
AST: 46 U/L — ABNORMAL HIGH (ref 15–41)
Albumin: 3.9 g/dL (ref 3.5–5.0)
Alkaline Phosphatase: 54 U/L (ref 38–126)
Anion gap: 11 (ref 5–15)
BUN: 9 mg/dL (ref 6–20)
CO2: 24 mmol/L (ref 22–32)
Calcium: 8.4 mg/dL — ABNORMAL LOW (ref 8.9–10.3)
Chloride: 103 mmol/L (ref 98–111)
Creatinine, Ser: 0.8 mg/dL (ref 0.44–1.00)
GFR calc Af Amer: >60 mL/min (ref >60)
GFR calc non Af Amer: >60 mL/min (ref >60)
Glucose, Bld: 110 mg/dL — ABNORMAL HIGH (ref 70–99)
Potassium: 3 mmol/L — ABNORMAL LOW (ref 3.5–5.1)
Sodium: 138 mmol/L (ref 135–145)
Total Bilirubin: 0.6 mg/dL (ref 0.3–1.2)
Total Protein: 7.3 g/dL (ref 6.5–8.1)

## 2019-11-19 LAB — CBC WITH DIFFERENTIAL/PLATELET
Abs Immature Granulocytes: 0 10*3/uL (ref 0.00–0.07)
Basophils Absolute: 0 10*3/uL (ref 0.0–0.1)
Basophils Relative: 0 %
Eosinophils Absolute: 0 10*3/uL (ref 0.0–0.5)
Eosinophils Relative: 0 %
HCT: 39.4 % (ref 36.0–46.0)
Hemoglobin: 14.5 g/dL (ref 12.0–15.0)
Immature Granulocytes: 0 %
Lymphocytes Relative: 48 %
Lymphs Abs: 1.7 10*3/uL (ref 0.7–4.0)
MCH: 31.3 pg (ref 26.0–34.0)
MCHC: 36.8 g/dL — ABNORMAL HIGH (ref 30.0–36.0)
MCV: 84.9 fL (ref 80.0–100.0)
Monocytes Absolute: 0.4 10*3/uL (ref 0.1–1.0)
Monocytes Relative: 10 %
Neutro Abs: 1.5 10*3/uL — ABNORMAL LOW (ref 1.7–7.7)
Neutrophils Relative %: 42 %
Platelets: 202 10*3/uL (ref 150–400)
RBC: 4.64 MIL/uL (ref 3.87–5.11)
RDW: 11.9 % (ref 11.5–15.5)
WBC: 3.6 10*3/uL — ABNORMAL LOW (ref 4.0–10.5)
nRBC: 0 % (ref 0.0–0.2)

## 2019-11-19 LAB — TROPONIN I (HIGH SENSITIVITY): Troponin I (High Sensitivity): <2 ng/L (ref <18)

## 2019-11-19 NOTE — ED Triage Notes (Signed)
Patient reports tested COVID positive this week.  Reports having shortness of breath.

## 2019-11-20 ENCOUNTER — Emergency Department
Admission: EM | Admit: 2019-11-20 | Discharge: 2019-11-20 | Disposition: A | Discharge status: Home / Self Care | Payer: Medicaid Other | Attending: Emergency Medicine | Admitting: Emergency Medicine

## 2019-11-20 DIAGNOSIS — U071 COVID-19: Secondary | ICD-10-CM

## 2019-11-20 DIAGNOSIS — R0602 Shortness of breath: Secondary | ICD-10-CM

## 2019-11-20 DIAGNOSIS — G8929 Other chronic pain: Secondary | ICD-10-CM

## 2019-11-20 DIAGNOSIS — F419 Anxiety disorder, unspecified: Secondary | ICD-10-CM

## 2019-11-20 LAB — POCT PREGNANCY, URINE: Preg Test, Ur: NEGATIVE

## 2019-11-20 LAB — SARS CORONAVIRUS 2 BY RT PCR (HOSPITAL ORDER, PERFORMED IN ~~LOC~~ HOSPITAL LAB): SARS Coronavirus 2: POSITIVE — AB

## 2019-11-20 LAB — TROPONIN I (HIGH SENSITIVITY): Troponin I (High Sensitivity): 3 ng/L (ref <18)

## 2019-11-20 MED ORDER — KETOROLAC TROMETHAMINE 30 MG/ML IJ SOLN: 15.0000 mg | Freq: Once | INTRAMUSCULAR | Status: DC

## 2019-11-20 MED ORDER — POTASSIUM CHLORIDE CRYS ER 20 MEQ PO TBCR
40.0000 meq | EXTENDED_RELEASE_TABLET | Freq: Once | ORAL | Status: AC
  Administered 2019-11-20: 40 meq via ORAL
  Filled 2019-11-20: qty 2

## 2019-11-20 MED ORDER — LIDOCAINE VISCOUS HCL 2 % MT SOLN
15.0000 mL | Freq: Once | OROMUCOSAL | Status: AC
  Administered 2019-11-20: 15 mL via ORAL
  Filled 2019-11-20: qty 15

## 2019-11-20 MED ORDER — DROPERIDOL 2.5 MG/ML IJ SOLN
2.5000 mg | Freq: Once | INTRAMUSCULAR | Status: AC
  Administered 2019-11-20: 2.5 mg via INTRAMUSCULAR
  Filled 2019-11-20: qty 2

## 2019-11-20 MED ORDER — ALUM & MAG HYDROXIDE-SIMETH 200-200-20 MG/5ML PO SUSP
30.0000 mL | Freq: Once | ORAL | Status: AC
  Administered 2019-11-20: 30 mL via ORAL
  Filled 2019-11-20: qty 30

## 2019-11-20 NOTE — ED Provider Notes (Signed)
[facility] Emergency Department Provider Note ____________________________________________   First MD Initiated Contact with Patient 11/20/19 [phone]     (approximate)  I have reviewed the triage vital signs and the nursing notes.  HISTORY  Chief Complaint Shortness of Breath   HPI Emily Nash is a 29 y.o. femalewho presents to the ED for evaluation of SOB  Chart review indicates history of GERD and anxiety.  Patient presents to ED with primary complaints of 2 months of abdominal pain and poor p.o. intake.  Patient reports seeing GI and having EGD performed with "chronic mild gastritis."  She reports frustration with her GI physicians and "no one knows what causing this terrible pain."  She is reporting 10/10 intensity generalized and diffuse abdominal pain that is been chronic for 2 months.  No medications have helped.   Regarding Covid and shortness of breath, patient requires frequent redirection as she is perseverating about her abdominal pain.  She indicates that she had symptoms starting 6 days ago after her husband contracted Covid at work.  She reports initially having sore throat, progressing to dyspnea and nonproductive cough that she is experienced in the past 4 days.  She reports associated burning upper chest pain that is worse at night, also constant and 6/10 intensity.  She reports associated fevers of 101 F.  Patient denies syncope, productive cough, vomiting, dysuria, vaginal bleeding.   Past Medical History:  Diagnosis Date  . Anxiety   . GERD (gastroesophageal reflux disease)    occ-tums prn  . Ovarian cyst   . Tachycardia    PT STATES THAT OCC SHE WILL FEEL HER HR GET REALLY FAST AND FEELS LIKE SHE CANT GET HER BREATH-ALL OF THIS RESOLVES WITHIN A COUPLE OF SECONDS  . Tobacco use    quit 2020    Patient Active Problem List   Diagnosis Date Noted  . Dysplasia of cervix, high grade CIN 2 07/12/2019    Past Surgical History:    Procedure Laterality Date  . CESAREAN SECTION    . CHOLECYSTECTOMY    . ESOPHAGOGASTRODUODENOSCOPY (EGD) WITH PROPOFOL N/A 10/28/2019   Procedure: ESOPHAGOGASTRODUODENOSCOPY (EGD) WITH PROPOFOL;  Surgeon: [name], MD;  Location: [facility] ENDOSCOPY;  Service: Endoscopy;  Laterality: N/A;  . gallbladder remove    . INDUCED ABORTION    . LEEP N/A 07/28/2017   Procedure: LOOP ELECTROSURGICAL EXCISION PROCEDURE (LEEP);  Surgeon: [name], MD;  Location: [facility] ORS;  Service: Gynecology;  Laterality: N/A;    Prior to Admission medications   Medication Sig Start Date End Date Taking? Authorizing Provider  dicyclomine (BENTYL) 10 MG capsule Take 1 capsule (10 mg total) by mouth 3 (three) times daily as needed for up to 14 days for spasms. Patient not taking: Reported on 10/28/2019 10/20/19 11/03/19  Willy Eddy, MD  etonogestrel (NEXPLANON) 68 MG IMPL implant 1 each by Subdermal route once.    [provider]  promethazine (PHENERGAN) 12.5 MG tablet Take 1 tablet (12.5 mg total) by mouth every 6 (six) hours as needed. Patient not taking: Reported on 10/28/2019 10/20/19   Willy Eddy, MD    Allergies Latex  Family History  Problem Relation Age of Onset  . Breast cancer Neg Hx   . Cancer Neg Hx   . Heart failure Neg Hx   . Diabetes Neg Hx   . Stroke Neg Hx   . Hypertension Neg Hx     Social History Social History   Tobacco Use  .  Smoking status: Former Smoker    Packs/day: 0.50    Years: 11.00    Pack years: 5.50    Types: Cigarettes    Quit date: 05/23/2018    Years since quitting: 1.4  . Smokeless tobacco: Never Used  Vaping Use  . Vaping Use: Former  Substance Use Topics  . Alcohol use: No  . Drug use: Not Currently    Comment: last use 08/2019    Review of Systems  Constitutional: Positive for fevers and chills Eyes: No visual changes. ENT: Positive for sore throat. Cardiovascular: Positive for chest pain. Respiratory: Positive for  shortness of breath. Gastrointestinal: Positive for abdominal pain.  No nausea, no vomiting.  Positive for chronic diarrhea.  No constipation. Genitourinary: Negative for dysuria. Musculoskeletal: Negative for back pain. Skin: Negative for rash. Neurological: Negative for headaches, focal weakness or numbness.  ____________________________________________   PHYSICAL EXAM:  VITAL SIGNS: Vitals:   11/20/19 0853 11/20/19 0854  BP: 120/84   Pulse: 99   Resp: 19   Temp:    SpO2: 100% 100%      Constitutional: Alert and oriented. Well appearing and in no acute distress. Eyes: Conjunctivae are normal. PERRL. EOMI. Head: Atraumatic. Nose: No congestion/rhinnorhea. Mouth/Throat: Mucous membranes are moist.  Oropharynx non-erythematous. Neck: No stridor. No cervical spine tenderness to palpation. Cardiovascular: Normal rate, regular rhythm. Grossly normal heart sounds.  Good peripheral circulation. Respiratory: Normal respiratory effort.  No retractions. Lungs CTAB. Gastrointestinal: Soft , nondistended, nontender to palpation. No abdominal bruits. No CVA tenderness. Musculoskeletal: No lower extremity tenderness nor edema.  No joint effusions. No signs of acute trauma. Neurologic:  Normal speech and language. No gross focal neurologic deficits are appreciated. No gait instability noted. Skin:  Skin is warm, dry and intact. No rash noted. Psychiatric: Mood and affect are normal. Speech and behavior are normal.  ____________________________________________   LABS (all labs ordered are listed, but only abnormal results are displayed)  Labs Reviewed  SARS CORONAVIRUS 2 BY RT PCR (HOSPITAL ORDER, PERFORMED IN [facility]) - Abnormal; Notable for the following components:      Result Value   SARS Coronavirus 2 POSITIVE (*)    All other components within normal limits  CBC WITH DIFFERENTIAL/PLATELET - Abnormal; Notable for the following components:   WBC 3.6 (*)    MCHC  36.8 (*)    Neutro Abs 1.5 (*)    All other components within normal limits  COMPREHENSIVE METABOLIC PANEL - Abnormal; Notable for the following components:   Potassium 3.0 (*)    Glucose, Bld 110 (*)    Calcium 8.4 (*)    AST 46 (*)    ALT 61 (*)    All other components within normal limits  POC URINE PREG, ED  POCT PREGNANCY, URINE  TROPONIN I (HIGH SENSITIVITY)  TROPONIN I (HIGH SENSITIVITY)   ____________________________________________  12 Lead EKG  Sinus rhythm, rate of 82 bpm, normal axis and intervals.  No evidence of acute ischemia. ____________________________________________  RADIOLOGY  ED MD interpretation: 2 view CXR reviewed without evidence of acute cardiopulmonary pathology  Official radiology report(s): DG Chest 2 View  Result Date: 11/19/2019 CLINICAL DATA:  Chest pain. COVID positive this week. Shortness of breath. EXAM: CHEST - 2 VIEW COMPARISON:  06/21/2017 FINDINGS: The cardiomediastinal contours are normal. No focal airspace disease. Pulmonary vasculature is normal. No consolidation, pleural effusion, or pneumothorax. No acute osseous abnormalities are seen. IMPRESSION: Negative radiographs of the chest. Electronically Signed   By: Shawna Orleans  Sanford M.D.   On: 11/19/2019 23:17    ____________________________________________   PROCEDURES and INTERVENTIONS  Procedure(s) performed (including Critical Care):  Procedures  Medications  potassium chloride SA (KLOR-CON) CR tablet 40 mEq (40 mEq Oral Given 11/20/19 0853)  alum & mag hydroxide-simeth (MAALOX/MYLANTA) 200-200-20 MG/5ML suspension 30 mL (30 mLs Oral Given 11/20/19 0854)    And  lidocaine (XYLOCAINE) 2 % viscous mouth solution 15 mL (15 mLs Oral Given 11/20/19 0854)  droperidol (INAPSINE) 2.5 MG/ML injection 2.5 mg (2.5 mg Intramuscular Given 11/20/19 0854)    ____________________________________________   MDM / ED COURSE  Healthy 29 year old female presenting with complaints of shortness of  breath in the setting of COVID-19, consistent with appropriate symptoms and amenable to outpatient management.  Normal vital signs on room air without hypoxia, tachycardia.  Reassuring examination with a well-appearing patient who has no distress, coughing, neurovascular deficits or signs of any acute pathology on my examination.  While she is reporting 10/10 intensity diffuse abdominal pain, her abdominal exam is benign and soft.  She reports improving symptoms after droperidol administration, and I believe a significant component of her symptoms are likely related to her anxiety.  Blood work demonstrates mild hypokalemia, that is repleted orally.  From a cardiopulmonary perspective, her CXR has no infiltrates, her EKG is nonischemic, her troponin is negative and she is ambulatory without hypoxia.  We discussed home quarantine and continued outpatient management of COVID-19, and we discussed return precautions for the ED.  Patient medically stable for discharge home.  Clinical Course as of Nov 19 936  Wynelle Link Nov 20, 2019  8144 Reassessed.  Urine pregnancy negative.  Patient reports improving abdominal pain, though does report some mild nausea after taking p.o. potassium.  We further discussed outpatient management of COVID-19 and return precautions for the ED.   [DS]    Clinical Course User Index [DS] Delton Prairie, MD     ____________________________________________   FINAL CLINICAL IMPRESSION(S) / ED DIAGNOSES  Final diagnoses:  COVID-19  SOB (shortness of breath)  Anxiety  Chronic abdominal pain     ED Discharge Orders    None       [place]   Note:  This document was prepared using Dragon voice recognition software and may include unintentional dictation errors.   Delton Prairie, MD 11/20/19 [phone]

## 2019-11-20 NOTE — ED Notes (Signed)
Patient ambulated around room without complication or assistance. O2 Saturation remained at 98% to 100% on room air with no signs or symptoms of dyspnea.

## 2019-11-20 NOTE — ED Notes (Signed)
Date and time results received: 11/20/19  (use smartphrase ".now" to insert current time)  Test: covid Critical Value: positive  Name of Provider Notified: Dr. Dolores Frame  Orders Received? Or Actions Taken?:

## 2019-11-20 NOTE — Discharge Instructions (Signed)
Please take Tylenol and ibuprofen/Advil for your pain.  It is safe to take them together, or to alternate them every few hours.  Take up to 1000mg  of Tylenol at a time, up to 4 times per day.  Do not take more than 4000 mg of Tylenol in 24 hours.  For ibuprofen, take 400-600 mg, 4-5 times per day.  Please follow-up with your GI doctor as we discussed.  If you develop any worsening symptoms, please return to the ED.

## 2019-11-20 NOTE — ED Notes (Signed)
Urine has been sent to lab. 

## 2019-11-21 ENCOUNTER — Ambulatory Visit (HOSPITAL_COMMUNITY)
Admission: RE | Admit: 2019-11-21 | Discharge: 2019-11-21 | Disposition: A | Discharge status: Home / Self Care | Payer: Medicaid Other | Source: Ambulatory Visit | Attending: Pulmonary Disease | Admitting: Pulmonary Disease

## 2019-11-21 ENCOUNTER — Encounter: Payer: Self-pay | Admitting: Adult Health

## 2019-11-21 ENCOUNTER — Other Ambulatory Visit: Payer: Self-pay | Admitting: Adult Health

## 2019-11-21 DIAGNOSIS — Z6830 Body mass index (BMI) 30.0-30.9, adult: Secondary | ICD-10-CM | POA: Diagnosis not present

## 2019-11-21 DIAGNOSIS — U071 COVID-19: Secondary | ICD-10-CM | POA: Insufficient documentation

## 2019-11-21 MED ORDER — EPINEPHRINE 0.3 MG/0.3ML IJ SOAJ: 0.3000 mg | Freq: Once | INTRAMUSCULAR | Status: DC | PRN

## 2019-11-21 MED ORDER — DIPHENHYDRAMINE HCL 50 MG/ML IJ SOLN: 50.0000 mg | Freq: Once | INTRAMUSCULAR | Status: DC | PRN

## 2019-11-21 MED ORDER — METHYLPREDNISOLONE SODIUM SUCC 125 MG IJ SOLR: 125.0000 mg | Freq: Once | INTRAMUSCULAR | Status: DC | PRN

## 2019-11-21 MED ORDER — ALBUTEROL SULFATE HFA 108 (90 BASE) MCG/ACT IN AERS: 2.0000 | INHALATION_SPRAY | Freq: Once | RESPIRATORY_TRACT | Status: DC | PRN

## 2019-11-21 MED ORDER — SODIUM CHLORIDE 0.9 % IV SOLN
1200.0000 mg | Freq: Once | INTRAVENOUS | Status: AC
  Administered 2019-11-21: 1200 mg via INTRAVENOUS

## 2019-11-21 MED ORDER — FAMOTIDINE IN NACL 20-0.9 MG/50ML-% IV SOLN: 20.0000 mg | Freq: Once | INTRAVENOUS | Status: DC | PRN

## 2019-11-21 MED ORDER — SODIUM CHLORIDE 0.9 % IV SOLN: INTRAVENOUS | Status: DC | PRN

## 2019-11-21 NOTE — Discharge Instructions (Signed)

## 2019-11-21 NOTE — Progress Notes (Signed)
I connected by phone with Emily Nash on 11/21/2019 at 10:12 AM to discuss the potential use of a new treatment for mild to moderate COVID-19 viral infection in non-hospitalized patients.  This patient is a 29 y.o. female that meets the FDA criteria for Emergency Use Authorization of COVID monoclonal antibody casirivimab/imdevimab.  Has a (+) direct SARS-CoV-2 viral test result  Has mild or moderate COVID-19   Is NOT hospitalized due to COVID-19  Is within 10 days of symptom onset  Has at least one of the high risk factor(s) for progression to severe COVID-19 and/or hospitalization as defined in EUA.  Specific high risk criteria : BMI > 25   I have spoken and communicated the following to the patient or parent/caregiver regarding COVID monoclonal antibody treatment:  1. FDA has authorized the emergency use for the treatment of mild to moderate COVID-19 in adults and pediatric patients with positive results of direct SARS-CoV-2 viral testing who are 71 years of age and older weighing at least 40 kg, and who are at high risk for progressing to severe COVID-19 and/or hospitalization.  2. The significant known and potential risks and benefits of COVID monoclonal antibody, and the extent to which such potential risks and benefits are unknown.  3. Information on available alternative treatments and the risks and benefits of those alternatives, including clinical trials.  4. Patients treated with COVID monoclonal antibody should continue to self-isolate and use infection control measures (e.g., wear mask, isolate, social distance, avoid sharing personal items, clean and disinfect "high touch" surfaces, and frequent handwashing) according to CDC guidelines.   5. The patient or parent/caregiver has the option to accept or refuse COVID monoclonal antibody treatment.  After reviewing this information with the patient, The patient agreed to proceed with receiving casirivimab\imdevimab infusion and  will be provided a copy of the Fact sheet prior to receiving the infusion. Noreene Filbert 11/21/2019 10:12 AM

## 2019-11-21 NOTE — Progress Notes (Signed)
  Diagnosis: COVID-19  Physician: Dr Patrick Wright  Procedure: Covid Infusion Clinic Med: casirivimab\imdevimab infusion - Provided patient with casirivimab\imdevimab fact sheet for patients, parents and caregivers prior to infusion.  Complications: No immediate complications noted.  Discharge: Discharged home   Kele Barthelemy 11/21/2019  

## 2019-11-22 ENCOUNTER — Ambulatory Visit: Payer: Self-pay | Admitting: Gastroenterology

## 2019-11-26 DIAGNOSIS — F419 Anxiety disorder, unspecified: Secondary | ICD-10-CM | POA: Insufficient documentation

## 2019-11-26 DIAGNOSIS — G479 Sleep disorder, unspecified: Secondary | ICD-10-CM | POA: Insufficient documentation

## 2019-11-26 DIAGNOSIS — R079 Chest pain, unspecified: Secondary | ICD-10-CM | POA: Insufficient documentation

## 2019-11-26 DIAGNOSIS — R200 Anesthesia of skin: Secondary | ICD-10-CM | POA: Insufficient documentation

## 2019-11-26 DIAGNOSIS — R202 Paresthesia of skin: Secondary | ICD-10-CM | POA: Insufficient documentation

## 2019-12-06 ENCOUNTER — Other Ambulatory Visit: Payer: Self-pay

## 2019-12-07 ENCOUNTER — Ambulatory Visit (INDEPENDENT_AMBULATORY_CARE_PROVIDER_SITE_OTHER): Payer: Medicaid Other | Admitting: Gastroenterology

## 2019-12-07 ENCOUNTER — Other Ambulatory Visit: Payer: Self-pay

## 2019-12-07 ENCOUNTER — Encounter: Payer: Self-pay | Admitting: Gastroenterology

## 2019-12-07 VITALS — BP 116/78 | HR 134 | Temp 98.1°F | Ht 62.0 in | Wt 170.0 lb

## 2019-12-07 DIAGNOSIS — R197 Diarrhea, unspecified: Secondary | ICD-10-CM | POA: Diagnosis not present

## 2019-12-07 DIAGNOSIS — R748 Abnormal levels of other serum enzymes: Secondary | ICD-10-CM

## 2019-12-07 MED ORDER — FAMOTIDINE 20 MG PO TABS: 20.0000 mg | ORAL_TABLET | Freq: Every day | ORAL | 0 refills | Status: DC

## 2019-12-07 NOTE — Patient Instructions (Signed)
Psyllium granules or powder for solution What is this medicine? PSYLLIUM (SIL i yum) is a bulk-forming fiber laxative. This medicine is used to treat constipation. Increasing fiber in the diet may also help lower cholesterol and promote heart health for some people. This medicine may be used for other purposes; ask your health care provider or pharmacist if you have questions. COMMON BRAND NAME(S): Fiber Therapy, GenFiber, Geri-Mucil, Hydrocil, Konsyl, Metamucil, Metamucil MultiHealth, Mucilin, Natural Fiber Therapy, Reguloid What should I tell my health care provider before I take this medicine? They need to know if you have any of these conditions:  blockage in your bowel  difficulty swallowing  inflammatory bowel disease  phenylketonuria  stomach or intestine problems  sudden change in bowel habits lasting more than 2 weeks  an unusual or allergic reaction to psyllium, other medicines, dyes, or preservatives  pregnant or trying or get pregnant  breast-feeding How should I use this medicine? Mix this medicine into a full glass (240 mL) of water or other cool drink. Take this medicine by mouth. Follow the directions on the package labeling, or take as directed by your health care professional. Take your medicine at regular intervals. Do not take your medicine more often than directed. Talk to your pediatrician regarding the use of this medicine in children. While this drug may be prescribed for children as young as 6 years old for selected conditions, precautions do apply. Overdosage: If you think you have taken too much of this medicine contact a poison control center or emergency room at once. NOTE: This medicine is only for you. Do not share this medicine with others. What if I miss a dose? If you miss a dose, take it as soon as you can. If it is almost time for your next dose, take only that dose. Do not take double or extra doses. What may interact with this  medicine? Interactions are not expected. Take this product at least 2 hours before or after other medicines. This list may not describe all possible interactions. Give your health care provider a list of all the medicines, herbs, non-prescription drugs, or dietary supplements you use. Also tell them if you smoke, drink alcohol, or use illegal drugs. Some items may interact with your medicine. What should I watch for while using this medicine? Check with your doctor or health care professional if your symptoms do not start to get better or if they get worse. Stop using this medicine and contact your doctor or health care professional if you have rectal bleeding or if you have to treat your constipation for more than 1 week. These could be signs of a more serious condition. Drink several glasses of water a day while you are taking this medicine. This will help to relieve constipation and prevent dehydration. What side effects may I notice from receiving this medicine? Side effects that you should report to your doctor or health care professional as soon as possible:  allergic reactions like skin rash, itching or hives, swelling of the face, lips, or tongue  breathing problems  chest pain  nausea, vomiting  rectal bleeding  trouble swallowing Side effects that usually do not require medical attention (report to your doctor or health care professional if they continue or are bothersome):  bloating  gas  stomach cramps This list may not describe all possible side effects. Call your doctor for medical advice about side effects. You may report side effects to FDA at 1-800-FDA-1088. Where should I keep my medicine?   Keep out of the reach of children. Store at room temperature between 15 and 30 degrees C (59 and 86 degrees F). Protect from moisture. Throw away any unused medicine after the expiration date. NOTE: This sheet is a summary. It may not cover all possible information. If you have  questions about this medicine, talk to your doctor, pharmacist, or health care provider.  2020 Elsevier/Gold Standard (2017-08-04 15:41:08)  

## 2019-12-08 ENCOUNTER — Other Ambulatory Visit: Payer: Self-pay | Admitting: Gastroenterology

## 2019-12-08 LAB — HEPATIC FUNCTION PANEL
ALT: 31 IU/L (ref 0–32)
AST: 16 IU/L (ref 0–40)
Albumin: 4.2 g/dL (ref 3.9–5.0)
Alkaline Phosphatase: 77 IU/L (ref 44–121)
Bilirubin Total: 0.4 mg/dL (ref 0.0–1.2)
Bilirubin, Direct: 0.15 mg/dL (ref 0.00–0.40)
Total Protein: 7 g/dL (ref 6.0–8.5)

## 2019-12-08 NOTE — Progress Notes (Signed)
Emily Nash [address]  [address]  [place], [place] [address]  Main: [phone]  Fax: [phone]   Gastroenterology Consultation  Referring Provider:     Trenda Moots Primary Care Physician:  [facility] Reason for Consultation:   Diarrhea, abdominal pain        HPI:    Chief Complaint  Patient presents with  . Abdominal Pain    Patient stated that she had been having abdominal pain after she eats for the past 3 months.    Emily Nash is a 29 y.o. y/o female referred for consultation & management  by Dr. Eli Phillips, [facility].  Patient reports chronic history of diarrhea with multiple today.  Reports 3-4 loose bowel movements a day.  No blood in stool.  However, did have an episode a few months ago with increasing diarrhea and associated with nausea vomiting, but this has since resolved and she is back to her baseline.  She was evaluated by [facility] and diagnosed with IBS.  Rifaximin was ordered but due to cost, patient was unable to get it.  Patient has never had a colonoscopy.  Past Medical History:  Diagnosis Date  . Anxiety   . GERD (gastroesophageal reflux disease)    occ-tums prn  . Ovarian cyst   . Tachycardia    PT STATES THAT OCC SHE WILL FEEL HER HR GET REALLY FAST AND FEELS LIKE SHE CANT GET HER BREATH-ALL OF THIS RESOLVES WITHIN A COUPLE OF SECONDS  . Tobacco use    quit 2020    Past Surgical History:  Procedure Laterality Date  . CESAREAN SECTION    . CHOLECYSTECTOMY    . ESOPHAGOGASTRODUODENOSCOPY (EGD) WITH PROPOFOL N/A 10/28/2019   Procedure: ESOPHAGOGASTRODUODENOSCOPY (EGD) WITH PROPOFOL;  Surgeon: [name], MD;  Location: [facility] ENDOSCOPY;  Service: Endoscopy;  Laterality: N/A;  . gallbladder remove    . INDUCED ABORTION    . LEEP N/A 07/28/2017   Procedure: LOOP ELECTROSURGICAL EXCISION PROCEDURE (LEEP);  Surgeon: [name], MD;  Location: [facility] ORS;   Service: Gynecology;  Laterality: N/A;    Prior to Admission medications   Medication Sig Start Date End Date Taking? Authorizing Provider  famotidine (PEPCID) 20 MG tablet Take 1 tablet (20 mg total) by mouth daily. 12/07/19   Pasty Spillers, MD    Family History  Problem Relation Age of Onset  . Breast cancer Neg Hx   . Cancer Neg Hx   . Heart failure Neg Hx   . Diabetes Neg Hx   . Stroke Neg Hx   . Hypertension Neg Hx      Social History   Tobacco Use  . Smoking status: Former Smoker    Packs/day: 0.50    Years: 11.00    Pack years: 5.50    Types: Cigarettes    Quit date: 05/23/2018    Years since quitting: 1.5  . Smokeless tobacco: Never Used  Vaping Use  . Vaping Use: Former  Substance Use Topics  . Alcohol use: No  . Drug use: Not Currently    Comment: last use 08/2019    Allergies as of 12/07/2019 - Review Complete 12/07/2019  Allergen Reaction Noted  . Latex Swelling and Rash 07/21/2017    Review of Systems:    All systems reviewed and negative except where noted in HPI.   Physical Exam:  BP 116/78   Pulse (!) 134   Temp  98.1 F (36.7 C) (Oral)   Ht 5\' 2"  (1.575 m)   Wt 170 lb (77.1 kg)   BMI 31.09 kg/m  No LMP recorded. Patient has had an implant. Psych:  Alert and cooperative. Normal mood and affect. General:   Alert,  Well-developed, well-nourished, pleasant and cooperative in NAD Head:  Normocephalic and atraumatic. Eyes:  Sclera clear, no icterus.   Conjunctiva pink. Ears:  Normal auditory acuity. Nose:  No deformity, discharge, or lesions. Mouth:  No deformity or lesions,oropharynx pink & moist. Neck:  Supple; no masses or thyromegaly. Abdomen:  Normal bowel sounds.  No bruits.  Soft, non-tender and non-distended without masses, hepatosplenomegaly or hernias noted.  No guarding or rebound tenderness.    Msk:  Symmetrical without gross deformities. Good, equal movement & strength bilaterally. Pulses:  Normal pulses noted. Extremities:   No clubbing or edema.  No cyanosis. Neurologic:  Alert and oriented x3;  grossly normal neurologically. Skin:  Intact without significant lesions or rashes. No jaundice. Lymph Nodes:  No significant cervical adenopathy. Psych:  Alert and cooperative. Normal mood and affect.   Labs: CBC    Component Value Date/Time   WBC 3.6 (L) 11/19/2019 2246   RBC 4.64 11/19/2019 2246   HGB 14.5 11/19/2019 2246   HGB 13.0 12/16/2013 0550   HCT 39.4 11/19/2019 2246   HCT 40.1 12/16/2013 0550   PLT 202 11/19/2019 2246   PLT 225 12/16/2013 0550   MCV 84.9 11/19/2019 2246   MCV 88 12/16/2013 0550   MCH 31.3 11/19/2019 2246   MCHC 36.8 (H) 11/19/2019 2246   RDW 11.9 11/19/2019 2246   RDW 12.6 12/16/2013 0550   LYMPHSABS 1.7 11/19/2019 2246   LYMPHSABS 2.4 12/16/2013 0550   MONOABS 0.4 11/19/2019 2246   MONOABS 0.5 12/16/2013 0550   EOSABS 0.0 11/19/2019 2246   EOSABS 0.0 12/16/2013 0550   BASOSABS 0.0 11/19/2019 2246   BASOSABS 0.0 12/16/2013 0550   CMP     Component Value Date/Time   NA 138 11/19/2019 2246   NA 140 12/16/2013 0550   K 3.0 (L) 11/19/2019 2246   K 3.7 12/16/2013 0550   CL 103 11/19/2019 2246   CL 107 12/16/2013 0550   CO2 24 11/19/2019 2246   CO2 30 12/16/2013 0550   GLUCOSE 110 (H) 11/19/2019 2246   GLUCOSE 77 12/16/2013 0550   BUN 9 11/19/2019 2246   BUN 8 12/16/2013 0550   CREATININE 0.80 11/19/2019 2246   CREATININE 0.93 12/16/2013 0550   CALCIUM 8.4 (L) 11/19/2019 2246   CALCIUM 7.9 (L) 12/16/2013 0550   PROT 7.0 12/07/2019 1414   PROT 7.1 12/16/2013 0550   ALBUMIN 4.2 12/07/2019 1414   ALBUMIN 3.4 12/16/2013 0550   AST 16 12/07/2019 1414   AST 33 12/16/2013 0550   ALT 31 12/07/2019 1414   ALT 44 12/16/2013 0550   ALKPHOS 77 12/07/2019 1414   ALKPHOS 65 12/16/2013 0550   BILITOT 0.4 12/07/2019 1414   BILITOT 0.4 12/16/2013 0550   GFRNONAA >60 11/19/2019 2246   GFRNONAA >60 12/16/2013 0550   GFRNONAA >60 11/30/2013 0603   GFRAA >60 11/19/2019 2246    GFRAA >60 12/16/2013 0550   GFRAA >60 11/30/2013 0603    Imaging Studies: DG Chest 2 View  Result Date: 11/19/2019 CLINICAL DATA:  Chest pain. COVID positive this week. Shortness of breath. EXAM: CHEST - 2 VIEW COMPARISON:  06/21/2017 FINDINGS: The cardiomediastinal contours are normal. No focal airspace disease. Pulmonary vasculature is normal. No consolidation,  pleural effusion, or pneumothorax. No acute osseous abnormalities are seen. IMPRESSION: Negative radiographs of the chest. Electronically Signed   By: Narda Rutherford M.D.   On: 11/19/2019 23:17    Assessment and Plan:   MARNEE SHERRARD is a 29 y.o. y/o female has been referred for diarrhea and abdominal pain  We will order fecal pancreatic elastase  Infectious work-up in August 2021 reviewed, in Care Everywhere and was negative.  She calprotectin normal.  H. pylori breath test negative but was done on PPI.  I obtained repeat liver enzymes on today's visit and these are normal.  Were previously mildly elevated.  Patient also reports GERD-like symptoms, start Pepcid  Metamucil to help bulk stool If symptoms not better, consider evaluation with procedures in the near future  EGD recently reassuring  Dr Emily Nash  Speech recognition software was used to dictate the above note.

## 2019-12-10 DIAGNOSIS — R5383 Other fatigue: Secondary | ICD-10-CM | POA: Insufficient documentation

## 2019-12-10 DIAGNOSIS — R531 Weakness: Secondary | ICD-10-CM | POA: Insufficient documentation

## 2019-12-12 DIAGNOSIS — R000 Tachycardia, unspecified: Secondary | ICD-10-CM | POA: Insufficient documentation

## 2019-12-14 ENCOUNTER — Ambulatory Visit (INDEPENDENT_AMBULATORY_CARE_PROVIDER_SITE_OTHER): Payer: Medicaid Other

## 2019-12-14 ENCOUNTER — Ambulatory Visit (INDEPENDENT_AMBULATORY_CARE_PROVIDER_SITE_OTHER): Payer: Medicaid Other | Admitting: Obstetrics and Gynecology

## 2019-12-14 ENCOUNTER — Other Ambulatory Visit: Payer: Self-pay

## 2019-12-14 ENCOUNTER — Encounter: Payer: Self-pay | Admitting: Obstetrics and Gynecology

## 2019-12-14 ENCOUNTER — Other Ambulatory Visit (HOSPITAL_COMMUNITY)
Admission: RE | Admit: 2019-12-14 | Discharge: 2019-12-14 | Disposition: A | Discharge status: Home / Self Care | Payer: Medicaid Other | Source: Ambulatory Visit | Attending: Obstetrics and Gynecology | Admitting: Obstetrics and Gynecology

## 2019-12-14 VITALS — BP 122/74 | Ht 62.0 in | Wt 169.0 lb

## 2019-12-14 DIAGNOSIS — N83202 Unspecified ovarian cyst, left side: Secondary | ICD-10-CM | POA: Diagnosis not present

## 2019-12-14 DIAGNOSIS — Z3046 Encounter for surveillance of implantable subdermal contraceptive: Secondary | ICD-10-CM

## 2019-12-14 DIAGNOSIS — N871 Moderate cervical dysplasia: Secondary | ICD-10-CM | POA: Diagnosis not present

## 2019-12-14 NOTE — Progress Notes (Signed)
Gynecology Ultrasound Follow Up   Chief Complaint  Patient presents with  . Follow-up  . Colposcopy   U/S for left ovarian cyst, abnormal pap smear, change Nexplanon.   History of Present Illness: Patient is a 29 y.o. female who presents today for ultrasound evaluation of the above .  Ultrasound demonstrates the following findings Adnexa: left ovarian cyst measuring 4.4 x 3.0 x 3.6 cm, simple appearing Uterus: anteverted with endometrial stripe  2.8 mm Additional: no abnormalities unless noted above.  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  She had an ASCUS, HPV negative pap smear in April.  She has had a Nexplanon in place since 2014.   Past Medical History:  Diagnosis Date  . Anxiety   . GERD (gastroesophageal reflux disease)    occ-tums prn  . Ovarian cyst   . Tachycardia    PT STATES THAT OCC SHE WILL FEEL HER HR GET REALLY FAST AND FEELS LIKE SHE CANT GET HER BREATH-ALL OF THIS RESOLVES WITHIN A COUPLE OF SECONDS  . Tobacco use    quit 2020    Past Surgical History:  Procedure Laterality Date  . CESAREAN SECTION    . CHOLECYSTECTOMY    . ESOPHAGOGASTRODUODENOSCOPY (EGD) WITH PROPOFOL N/A 10/28/2019   Procedure: ESOPHAGOGASTRODUODENOSCOPY (EGD) WITH PROPOFOL;  Surgeon: [name], MD;  Location: [facility] ENDOSCOPY;  Service: Endoscopy;  Laterality: N/A;  . gallbladder remove    . INDUCED ABORTION    . LEEP N/A 07/28/2017   Procedure: LOOP ELECTROSURGICAL EXCISION PROCEDURE (LEEP);  Surgeon: [name], MD;  Location: [facility] ORS;  Service: Gynecology;  Laterality: N/A;     Family History  Problem Relation Age of Onset  . Breast cancer Neg Hx   . Cancer Neg Hx   . Heart failure Neg Hx   . Diabetes Neg Hx   . Stroke Neg Hx   . Hypertension Neg Hx     Social History   Socioeconomic History  . Marital status: Single    Spouse name: Not on file  . Number of children: Not on file  . Years of education: Not on file    . Highest education level: Not on file  Occupational History  . Not on file  Tobacco Use  . Smoking status: Former Smoker    Packs/day: 0.50    Years: 11.00    Pack years: 5.50    Types: Cigarettes    Quit date: 05/23/2018    Years since quitting: 1.5  . Smokeless tobacco: Never Used  Vaping Use  . Vaping Use: Former  Substance and Sexual Activity  . Alcohol use: No  . Drug use: Not Currently    Comment: last use 08/2019  . Sexual activity: Not Currently    Birth control/protection: Implant  Other Topics Concern  . Not on file  Social History Narrative  . Not on file   Social Determinants of Health   Financial Resource Strain:   . Difficulty of Paying Living Expenses: Not on file  Food Insecurity:   . Worried About Programme researcher, broadcasting/film/video in the Last Year: Not on file  . Ran Out of Food in the Last Year: Not on file  Transportation Needs:   . Lack of Transportation (Medical): Not on file  . Lack of Transportation (Non-Medical): Not on file  Physical Activity:   . Days of Exercise per Week: Not on file  . Minutes of Exercise per  Session: Not on file  Stress:   . Feeling of Stress : Not on file  Social Connections:   . Frequency of Communication with Friends and Family: Not on file  . Frequency of Social Gatherings with Friends and Family: Not on file  . Attends Religious Services: Not on file  . Active Member of Clubs or Organizations: Not on file  . Attends Banker Meetings: Not on file  . Marital Status: Not on file  Intimate Partner Violence:   . Fear of Current or Ex-Partner: Not on file  . Emotionally Abused: Not on file  . Physically Abused: Not on file  . Sexually Abused: Not on file    Allergies  Allergen Reactions  . Latex Swelling and Rash    Prior to Admission medications   Medication Sig Start Date End Date Taking? Authorizing Provider  famotidine (PEPCID) 20 MG tablet Take 1 tablet (20 mg total) by mouth daily. 12/07/19   Pasty Spillers, MD    Physical Exam BP 122/74   Ht 5\' 2"  (1.575 m)   Wt 169 lb (76.7 kg)   BMI 30.91 kg/m    General: NAD HEENT: normocephalic, anicteric Pulmonary: No increased work of breathing Extremities: no edema, erythema, or tenderness Neurologic: Grossly intact, normal gait Psychiatric: mood appropriate, affect full  Colposcopy:   Pelvic:   Vulva: Normal appearance.  No lesions.  Vagina: No lesions or abnormalities noted.  Support: Normal pelvic support.  Urethra No masses tenderness or scarring.  Meatus Normal size without lesions or prolapse.  Cervix: See below.  Anus: Normal exam.  No lesions.  Perineum: Normal exam.  No lesions.        Bimanual   Uterus: Normal size.  Non-tender.  Mobile.  AV.  Adnexae: No masses.  Non-tender to palpation.  Cul-de-sac: Negative for abnormality.   PROCEDURE: 1.  Urine Pregnancy Test:  negative 2.  Colposcopy performed with 4% acetic acid after verbal consent obtained                                         -Aceto-white Lesions Location(s): mild diffusely               -Biopsy performed at 6 and 12 o'clock               -ECC indicated and performed: Yes.       -Biopsy sites made hemostatic with pressure and/ Monsel's solution   -Satisfactory colposcopy: No.    -Evidence of Invasive cervical CA :  NO    GYNECOLOGY PROCEDURE NOTE  Nexplanon removal discussed in detail.  Risks of infection, bleeding, nerve injury all reviewed.  Patient understands risks and desires to proceed.  Verbal consent obtained.  Patient is certain she wants the Nexplanon removed.  All questions answered.  Procedure: Patient placed in dorsal supine with left arm above head, elbow flexed at 90 degrees, arm resting on examination table.  Nexplanon identified without problems.  Betadine scrub x3.  1 ml of 1% lidocaine injected under Nexplanondevice without problems.  Sterile gloves applied.  Small 0.5cm incision made at distal tip of Nexplanon device with 11  blade scalpel.  Nexplanon brought to incision and grasped with a small kelly clamp.  Nexplanon removed intact without problems.  Pressure applied to incision.  Hemostasis obtained.  Steri-strips applied, followed by bandage and compression dressing.  Patient  tolerated procedure well.  No complications.  Imaging Results US PELVIS TRANSVAGINAL NON-OB (TV ONLY)  Result Date: 12/14/2019 Patient Name: Emily Nash DOB: February 16, 1991 MRN: [id number] ULTRASOUND REPORT Location: [facility] Date of Service: 12/14/2019 Indications:f/u Left Ovarian Cyst Findings: The uterus is anteverted and measures 9.1 x 5.7 x 4.0 cm. Echo texture is homogenous without evidence of focal masses. The Endometrium measures 2.8 mm. Right Ovary measures 3.1 x 1.9 x 1.8 cm. It is normal in appearance. Left Ovary measures 5.2 x 4.2 x 4.2 cm. There is a simple cyst in the left ovary measuring 4.4 x 3.0 x 3.6 cm. There is normal blood flow seen within the left ovary. Survey of the adnexa demonstrates no adnexal masses. There is no free fluid in the cul de sac. Impression: 1. Normal appearing uterus and cervix. 2. Normal right ovary. 3. There is a 4.4 cm simple cyst in the left ovary. This is decreased in size from prior (~5cm in each direction) Deanna Artis, RT The ultrasound images and findings were reviewed by me and I agree with the above report. Thomasene Mohair, MD, [facility], [facility] 12/14/2019 10:43 AM     Assessment: 29 y.o. [id number]  1. Dysplasia of cervix, high grade CIN 2   2. Left ovarian cyst   3. Nexplanon removal      Plan: Problem List Items Addressed This Visit      Genitourinary   Dysplasia of cervix, high grade CIN 2 - Primary   Relevant Orders   Surgical pathology    Other Visit Diagnoses    Left ovarian cyst       Relevant Orders   US PELVIS TRANSVAGINAL NON-OB (TV ONLY)   Nexplanon removal         Left ovarian cyst: size has decreased and she is asymptomatic. Plan to  recheck in February at time of her next annual.   Nexplanon removal: uncomplicated. She will utilize nothing for contraception for now.  She was offered replacement today and we discussed other methods of contraception.  Patient given post procedure precautions and asked to call for fever, chills, redness or drainage from her incision, bleeding from incision.  She understands she will likely have a small bruise near site of removal and can remove bandage tomorrow and steri-strips in approximately 1 week.  A total of 20 minutes were spent face-to-face with the patient as well as preparation, review, communication, and documentation during this encounter as relates to her ovarian cyst.  The procedures performed required separate amounts of time.   Return in about 2 months (around 02/13/2020) for 2-3 months Pelvic U/S and follow up Dr. Jean Rosenthal.   Thomasene Mohair, MD, [facility], [facility] 12/14/2019 11:41 AM

## 2019-12-15 ENCOUNTER — Encounter: Payer: Self-pay | Admitting: Obstetrics and Gynecology

## 2019-12-15 DIAGNOSIS — F411 Generalized anxiety disorder: Secondary | ICD-10-CM | POA: Insufficient documentation

## 2019-12-15 LAB — PANCREATIC ELASTASE, FECAL: Pancreatic Elastase, Fecal: >500 ug Elast./g (ref >200)

## 2019-12-16 LAB — SURGICAL PATHOLOGY

## 2019-12-27 ENCOUNTER — Ambulatory Visit: Payer: Medicaid Other | Admitting: Physician Assistant

## 2020-01-03 ENCOUNTER — Other Ambulatory Visit: Payer: Self-pay

## 2020-01-03 MED ORDER — PANTOPRAZOLE SODIUM 40 MG PO TBEC: 40.0000 mg | DELAYED_RELEASE_TABLET | Freq: Every day | ORAL | 1 refills | Status: DC

## 2020-01-10 ENCOUNTER — Telehealth: Payer: Self-pay | Admitting: Obstetrics and Gynecology

## 2020-01-10 NOTE — Telephone Encounter (Signed)
Patient is calling to follow up on her Colpo results. Patient aware SDJ is out of the office. Please advise

## 2020-01-16 NOTE — Telephone Encounter (Signed)
Pt aware that SDJ back in office today and will call with results once he reviews. Do you want me to call her back and let her know results? Looks like CIN 1

## 2020-01-19 NOTE — Telephone Encounter (Signed)
Yes. I sent her a message. But, the results are [facility] and we can follow it up with a pap smear in one year.

## 2020-02-13 ENCOUNTER — Ambulatory Visit: Payer: Medicaid Other | Admitting: Obstetrics and Gynecology

## 2020-02-13 ENCOUNTER — Ambulatory Visit: Payer: Medicaid Other

## 2020-02-13 DIAGNOSIS — R002 Palpitations: Secondary | ICD-10-CM | POA: Insufficient documentation

## 2020-02-14 ENCOUNTER — Ambulatory Visit: Payer: Medicaid Other | Admitting: Obstetrics and Gynecology

## 2020-02-14 ENCOUNTER — Ambulatory Visit: Payer: Medicaid Other

## 2020-02-28 ENCOUNTER — Ambulatory Visit (INDEPENDENT_AMBULATORY_CARE_PROVIDER_SITE_OTHER): Payer: Medicaid Other

## 2020-02-28 ENCOUNTER — Ambulatory Visit (INDEPENDENT_AMBULATORY_CARE_PROVIDER_SITE_OTHER): Payer: Medicaid Other | Admitting: Obstetrics and Gynecology

## 2020-02-28 ENCOUNTER — Encounter: Payer: Self-pay | Admitting: Obstetrics and Gynecology

## 2020-02-28 ENCOUNTER — Other Ambulatory Visit: Payer: Self-pay

## 2020-02-28 VITALS — BP 122/78

## 2020-02-28 DIAGNOSIS — N83202 Unspecified ovarian cyst, left side: Secondary | ICD-10-CM

## 2020-02-28 DIAGNOSIS — N6325 Unspecified lump in the left breast, overlapping quadrants: Secondary | ICD-10-CM | POA: Diagnosis not present

## 2020-02-28 NOTE — Progress Notes (Signed)
Gynecology Ultrasound Follow Up   Chief Complaint  Patient presents with  . Follow-up  Left ovarian cyst   History of Present Illness: Patient is a 29 y.o. female who presents today for ultrasound evaluation of the above .  Ultrasound demonstrates the following findings Adnexa: no masses seen (left ovarian cyst is absent) Uterus: anteverted with endometrial stripe  8.6 mm Additional: no abnormalities  Prior ultrasound showed a simple appearing left ovarian cyst measuring 4.4 x 3.0 x 3.6 cm.   She also mentions left breast pain since August. The pain is constant and burning. Nothing makes it better or worse. No associated symptoms, including; skin changes, nipple leakage, etc.   Past Medical History:  Diagnosis Date  . Anxiety   . GERD (gastroesophageal reflux disease)    occ-tums prn  . Ovarian cyst   . Tachycardia    PT STATES THAT OCC SHE WILL FEEL HER HR GET REALLY FAST AND FEELS LIKE SHE CANT GET HER BREATH-ALL OF THIS RESOLVES WITHIN A COUPLE OF SECONDS  . Tobacco use    quit 2020    Past Surgical History:  Procedure Laterality Date  . CESAREAN SECTION    . CHOLECYSTECTOMY    . ESOPHAGOGASTRODUODENOSCOPY (EGD) WITH PROPOFOL N/A 10/28/2019   Procedure: ESOPHAGOGASTRODUODENOSCOPY (EGD) WITH PROPOFOL;  Surgeon: [name], MD;  Location: [facility] END[facility]COPY;  Service: Endoscopy;  Laterality: N/A;  . gallbladder remove    . INDUCED ABORTION    . LEEP N/A 07/28/2017   Procedure: LOOP ELECTROSURGICAL EXCISION PROCEDURE (LEEP);  Surgeon: [name], MD;  Location: [facility] ORS;  Service: Gynecology;  Laterality: N/A;    Family History  Problem Relation Age of Onset  . Breast cancer Neg Hx   . Cancer Neg Hx   . Heart failure Neg Hx   . Diabetes Neg Hx   . Stroke Neg Hx   . Hypertension Neg Hx     Social History   Socioeconomic History  . Marital status: Single    Spouse name: Not on file  . Number of children: Not on file  . Years of education: Not  on file  . Highest education level: Not on file  Occupational History  . Not on file  Tobacco Use  . Smoking status: Former Smoker    Packs/day: 0.50    Years: 11.00    Pack years: 5.50    Types: Cigarettes    Quit date: 05/23/2018    Years since quitting: 1.7  . Smokeless tobacco: Never Used  Vaping Use  . Vaping Use: Former  Substance and Sexual Activity  . Alcohol use: No  . Drug use: Not Currently    Comment: last use 08/2019  . Sexual activity: Not Currently    Birth control/protection: Implant  Other Topics Concern  . Not on file  Social History Narrative  . Not on file   Social Determinants of Health   Financial Resource Strain:   . Difficulty of Paying Living Expenses: Not on file  Food Insecurity:   . Worried About Programme researcher, broadcasting/film/video in the Last Year: Not on file  . Ran Out of Food in the Last Year: Not on file  Transportation Needs:   . Lack of Transportation (Medical): Not on file  . Lack of Transportation (Non-Medical): Not on file  Physical Activity:   . Days of Exercise per Week: Not on file  . Minutes of Exercise per Session: Not on file  Stress:   . Feeling  of Stress : Not on file  Social Connections:   . Frequency of Communication with Friends and Family: Not on file  . Frequency of Social Gatherings with Friends and Family: Not on file  . Attends Religious Services: Not on file  . Active Member of Clubs or Organizations: Not on file  . Attends Banker Meetings: Not on file  . Marital Status: Not on file  Intimate Partner Violence:   . Fear of Current or Ex-Partner: Not on file  . Emotionally Abused: Not on file  . Physically Abused: Not on file  . Sexually Abused: Not on file    Allergies  Allergen Reactions  . Latex Swelling and Rash    Prior to Admission medications   Medication Sig Start Date End Date Taking? Authorizing Provider  gabapentin (NEURONTIN) 300 MG capsule Take by mouth. 02/03/20  Yes [provider]   pantoprazole (PROTONIX) 40 MG tablet Take 1 tablet (40 mg total) by mouth daily. Patient not taking: Reported on 02/28/2020 01/03/20   Pasty Spillers, MD    Physical Exam BP 122/78   Physical Exam Constitutional:      General: She is not in acute distress.    Appearance: Normal appearance.  HENT:     Head: Normocephalic and atraumatic.  Eyes:     General: No scleral icterus.    Conjunctiva/sclera: Conjunctivae normal.  Chest:     Breasts:        Right: No swelling, bleeding, inverted nipple, mass, nipple discharge, skin change or tenderness.        Left: Mass and tenderness present. No swelling, bleeding, inverted nipple, nipple discharge or skin change.    Neurological:     General: No focal deficit present.     Mental Status: She is alert and oriented to person, place, and time.     Cranial Nerves: No cranial nerve deficit.  Psychiatric:        Mood and Affect: Mood normal.        Behavior: Behavior normal.        Judgment: Judgment normal.      Imaging Results US PELVIS TRANSVAGINAL NON-OB (TV ONLY)  Result Date: 02/28/2020 Patient Name: KATENA PETITJEAN DOB: 1990/08/09 MRN: [id number] ULTRASOUND REPORT Location: [facility] Date of Service: 02/28/2020 Indications: Follow up left ovarian cyst Findings: The uterus is anteverted and measures 9.1 x 5.8 x 4.4 cm. Echo texture is homogenous without evidence of focal masses. The Endometrium measures 8.6 mm. Normal appearance. Right Ovary measures 3.7 x 2.3 x 2.1 cm. It is normal in appearance. Left Ovary measures 2.6 x 2.7 x 2.0 cm. It is normal in appearance. Survey of the adnexa demonstrates no adnexal masses. There is no free fluid in the cul de sac. Impression: 1. Normal pelvic ultrasound. Deanna Artis, RT The ultrasound images and findings were reviewed by me and I agree with the above report. Thomasene Mohair, MD, [facility], [facility] 02/28/2020 5:00 PM      Assessment: 29 y.o. [id number]   1. Breast lump on left side at 3 o'clock position   2. Left ovarian cyst      Plan: Problem List Items Addressed This Visit    None    Visit Diagnoses    Breast lump on left side at 3 o'clock position    -  Primary   Relevant Orders   MM DIAG BREAST TOMO BILATERAL   US BREAST LTD UNI RIGHT INC AXILLA  US BREAST LTD UNI LEFT INC AXILLA   Left ovarian cyst         Left ovarian cyst: resolved. Patient reassured Left breast lump: will get imaging given her pain level  A total of 25 minutes were spent face-to-face with the patient as well as preparation, review, communication, and documentation during this encounter.    Thomasene Mohair, MD, [facility], [facility] 02/28/2020 5:29 PM

## 2020-03-02 DIAGNOSIS — M545 Low back pain, unspecified: Secondary | ICD-10-CM | POA: Insufficient documentation

## 2020-03-02 DIAGNOSIS — R519 Headache, unspecified: Secondary | ICD-10-CM | POA: Insufficient documentation

## 2020-03-07 ENCOUNTER — Other Ambulatory Visit: Payer: Self-pay | Admitting: Neurology

## 2020-03-07 DIAGNOSIS — G35 Multiple sclerosis: Secondary | ICD-10-CM

## 2020-03-14 ENCOUNTER — Other Ambulatory Visit: Payer: Self-pay

## 2020-03-14 ENCOUNTER — Ambulatory Visit
Admission: RE | Admit: 2020-03-14 | Discharge: 2020-03-14 | Disposition: A | Discharge status: Home / Self Care | Payer: Medicaid Other | Source: Ambulatory Visit | Attending: Obstetrics and Gynecology | Admitting: Obstetrics and Gynecology

## 2020-03-14 DIAGNOSIS — N6325 Unspecified lump in the left breast, overlapping quadrants: Secondary | ICD-10-CM

## 2020-03-19 ENCOUNTER — Encounter: Payer: Self-pay | Admitting: Oncology

## 2020-03-19 ENCOUNTER — Ambulatory Visit
Admission: RE | Admit: 2020-03-19 | Discharge: 2020-03-19 | Disposition: A | Discharge status: Home / Self Care | Payer: Medicaid Other | Source: Ambulatory Visit | Attending: Neurology | Admitting: Neurology

## 2020-03-19 ENCOUNTER — Inpatient Hospital Stay: Payer: Medicaid Other

## 2020-03-19 ENCOUNTER — Other Ambulatory Visit: Payer: Self-pay

## 2020-03-19 ENCOUNTER — Inpatient Hospital Stay: Payer: Medicaid Other | Attending: Oncology | Admitting: Oncology

## 2020-03-19 VITALS — BP 109/77 | HR 107 | Temp 98.9°F | Resp 16 | Ht 62.0 in | Wt 182.6 lb

## 2020-03-19 DIAGNOSIS — R778 Other specified abnormalities of plasma proteins: Secondary | ICD-10-CM

## 2020-03-19 DIAGNOSIS — R768 Other specified abnormal immunological findings in serum: Secondary | ICD-10-CM | POA: Diagnosis not present

## 2020-03-19 DIAGNOSIS — G35 Multiple sclerosis: Secondary | ICD-10-CM | POA: Diagnosis not present

## 2020-03-19 LAB — CBC WITH DIFFERENTIAL/PLATELET
Abs Immature Granulocytes: 0.02 10*3/uL (ref 0.00–0.07)
Basophils Absolute: 0 10*3/uL (ref 0.0–0.1)
Basophils Relative: 1 %
Eosinophils Absolute: 0.2 10*3/uL (ref 0.0–0.5)
Eosinophils Relative: 3 %
HCT: 39.9 % (ref 36.0–46.0)
Hemoglobin: 13.9 g/dL (ref 12.0–15.0)
Immature Granulocytes: 0 %
Lymphocytes Relative: 32 %
Lymphs Abs: 1.9 10*3/uL (ref 0.7–4.0)
MCH: 31.2 pg (ref 26.0–34.0)
MCHC: 34.8 g/dL (ref 30.0–36.0)
MCV: 89.7 fL (ref 80.0–100.0)
Monocytes Absolute: 0.4 10*3/uL (ref 0.1–1.0)
Monocytes Relative: 6 %
Neutro Abs: 3.6 10*3/uL (ref 1.7–7.7)
Neutrophils Relative %: 58 %
Platelets: 280 10*3/uL (ref 150–400)
RBC: 4.45 MIL/uL (ref 3.87–5.11)
RDW: 11.7 % (ref 11.5–15.5)
WBC: 6.1 10*3/uL (ref 4.0–10.5)
nRBC: 0 % (ref 0.0–0.2)

## 2020-03-19 LAB — COMPREHENSIVE METABOLIC PANEL
ALT: 24 U/L (ref 0–44)
AST: 20 U/L (ref 15–41)
Albumin: 4 g/dL (ref 3.5–5.0)
Alkaline Phosphatase: 62 U/L (ref 38–126)
Anion gap: 12 (ref 5–15)
BUN: 9 mg/dL (ref 6–20)
CO2: 24 mmol/L (ref 22–32)
Calcium: 9.2 mg/dL (ref 8.9–10.3)
Chloride: 102 mmol/L (ref 98–111)
Creatinine, Ser: 0.62 mg/dL (ref 0.44–1.00)
GFR, Estimated: >60 mL/min (ref >60)
Glucose, Bld: 89 mg/dL (ref 70–99)
Potassium: 3.8 mmol/L (ref 3.5–5.1)
Sodium: 138 mmol/L (ref 135–145)
Total Bilirubin: 0.4 mg/dL (ref 0.3–1.2)
Total Protein: 7.7 g/dL (ref 6.5–8.1)

## 2020-03-19 NOTE — Progress Notes (Signed)
Pt new pt with [employer] . Pt was on a keto diet until June on 2021, aug. She had covid and got the monoclonal atb treatment. She has been hurting in her back it is in the mid spine at her bra level and it goes from left to right of that section of her back. She also is been having tingling in arms, back, legs off and on- saw neurology and rheumatology. Her vitamin d is low and she is on a MVI for it. She started feeling bad in her stomach during the summer when she came off her keto diet. Had EGD and per pt is showed gastritis.she also has anxiety about everything going on

## 2020-03-19 NOTE — Progress Notes (Signed)
Hematology/Oncology Consult note [facility] Telephone:([phone] [phone]  Patient Care Team: Center, [facility] as PCP - General (General Practice) Rico Junker, RN as Registered Nurse Theodore Demark, RN as Registered Nurse   Name of the patient: Emily Nash  [id number]  02-04-1991    Reason for referral-abnormal SPEP   Referring physician-Dr. Ephriam Jenkins  Date of visit: 03/19/20   History of presenting illness- Patient is a 29 year old female who was seen by Dr. Erskine Squibb from rheumatology for symptoms of polyarthralgia.  As a part of the work-up he ordered SPEP which showed mildly elevated IgA of 41.  There was no M spike observed but immunofixation showed polyclonal increase in immunoglobulins.  CMP from 12/11/2019 was normal.  CBC was normal as well with an H&H of 14.1/29.9.  Patient had Covid infection in August 2021.  Since then she has been having symptoms of Back pain and generalized pain in her joints.  Sometimes she has episodes of palpitations.  She was previously on a keto diet and lost about 30 pounds intentionally.   Pain scale- 5   Review of systems- Review of Systems  Constitutional: Positive for malaise/fatigue. Negative for chills, fever and weight loss.  HENT: Negative for congestion, ear discharge and nosebleeds.   Eyes: Negative for blurred vision.  Respiratory: Negative for cough, hemoptysis, sputum production, shortness of breath and wheezing.   Cardiovascular: Negative for chest pain, palpitations, orthopnea and claudication.  Gastrointestinal: Negative for abdominal pain, blood in stool, constipation, diarrhea, heartburn, melena, nausea and vomiting.  Genitourinary: Negative for dysuria, flank pain, frequency, hematuria and urgency.  Musculoskeletal: Positive for joint pain. Negative for back pain and myalgias.  Skin: Negative for rash.  Neurological: Negative for dizziness, tingling, focal  weakness, seizures, weakness and headaches.  Endo/Heme/Allergies: Does not bruise/bleed easily.  Psychiatric/Behavioral: Negative for depression and suicidal ideas. The patient does not have insomnia.     Allergies  Allergen Reactions  . Latex Swelling and Rash    Patient Active Problem List   Diagnosis Date Noted  . Acute chest pain 11/26/2019  . Anxiety, mild 11/26/2019  . Difficulty sleeping 11/26/2019  . Numbness and tingling of both legs 11/26/2019  . Dysplasia of cervix, high grade CIN 2 07/12/2019     Past Medical History:  Diagnosis Date  . Anxiety   . GERD (gastroesophageal reflux disease)    occ-tums prn  . Ovarian cyst   . Tachycardia    PT STATES THAT OCC SHE WILL FEEL HER HR GET REALLY FAST AND FEELS LIKE SHE CANT GET HER BREATH-ALL OF THIS RESOLVES WITHIN A COUPLE OF SECONDS  . Tobacco use    quit 2020     Past Surgical History:  Procedure Laterality Date  . CESAREAN SECTION    . CHOLECYSTECTOMY    . ESOPHAGOGASTRODUODENOSCOPY (EGD) WITH PROPOFOL N/A 10/28/2019   Procedure: ESOPHAGOGASTRODUODENOSCOPY (EGD) WITH PROPOFOL;  Surgeon: Lesly Rubenstein, MD;  Location: [facility] ENDOSCOPY;  Service: Endoscopy;  Laterality: N/A;  . gallbladder remove    . INDUCED ABORTION    . LEEP N/A 07/28/2017   Procedure: LOOP ELECTROSURGICAL EXCISION PROCEDURE (LEEP);  Surgeon: Homero Fellers, MD;  Location: [facility] ORS;  Service: Gynecology;  Laterality: N/A;    Social History   Socioeconomic History  . Marital status: Single    Spouse name: Not on file  . Number of children: Not on file  . Years of education: Not on file  . Highest  education level: Not on file  Occupational History  . Not on file  Tobacco Use  . Smoking status: Former Smoker    Packs/day: 0.50    Years: 11.00    Pack years: 5.50    Types: Cigarettes    Quit date: 05/23/2018    Years since quitting: 1.8  . Smokeless tobacco: Never Used  Vaping Use  . Vaping Use: Former  Substance and Sexual  Activity  . Alcohol use: No  . Drug use: Not Currently    Comment: last use 08/2019  . Sexual activity: Not Currently    Birth control/protection: Implant  Other Topics Concern  . Not on file  Social History Narrative  . Not on file   Social Determinants of Health   Financial Resource Strain: Not on file  Food Insecurity: Not on file  Transportation Needs: Not on file  Physical Activity: Not on file  Stress: Not on file  Social Connections: Not on file  Intimate Partner Violence: Not on file     Family History  Problem Relation Age of Onset  . Breast cancer Neg Hx   . Cancer Neg Hx   . Heart failure Neg Hx   . Diabetes Neg Hx   . Stroke Neg Hx   . Hypertension Neg Hx      Current Outpatient Medications:  .  gabapentin (NEURONTIN) 300 MG capsule, Take by mouth., Disp: , Rfl:  .  pantoprazole (PROTONIX) 40 MG tablet, Take 1 tablet (40 mg total) by mouth daily. (Patient not taking: Reported on 02/28/2020), Disp: 30 tablet, Rfl: 1   Physical exam:  Vitals:   03/19/20 1116  BP: 109/77  Pulse: (!) 107  Resp: 16  Temp: 98.9 F (37.2 C)  TempSrc: Tympanic  Weight: 182 lb 9.6 oz (82.8 kg)  Height: _0  (1.575 m)   Physical Exam Eyes:     Extraocular Movements: EOM normal.  Cardiovascular:     Rate and Rhythm: Normal rate and regular rhythm.     Heart sounds: Normal heart sounds.  Pulmonary:     Effort: Pulmonary effort is normal.     Breath sounds: Normal breath sounds.  Abdominal:     General: Bowel sounds are normal.     Palpations: Abdomen is soft.  Skin:    General: Skin is warm and dry.  Neurological:     Mental Status: She is alert and oriented to person, place, and time.        CMP Latest Ref Rng & Units 12/07/2019  Glucose 70 - 99 mg/dL -  BUN 6 - 20 mg/dL -  Creatinine 0.44 - 1.00 mg/dL -  Sodium 135 - 145 mmol/L -  Potassium 3.5 - 5.1 mmol/L -  Chloride 98 - 111 mmol/L -  CO2 22 - 32 mmol/L -  Calcium 8.9 - 10.3 mg/dL -  Total Protein 6.0  - 8.5 g/dL 7.0  Total Bilirubin 0.0 - 1.2 mg/dL 0.4  Alkaline Phos 44 - 121 IU/L 77  AST 0 - 40 IU/L 16  ALT 0 - 32 IU/L 31   CBC Latest Ref Rng & Units 11/19/2019  WBC 4.0 - 10.5 K/uL 3.6(L)  Hemoglobin 12.0 - 15.0 g/dL 14.5  Hematocrit 36.0 - 46.0 % 39.4  Platelets 150 - 400 K/uL 202    No images are attached to the encounter.  US PELVIS TRANSVAGINAL NON-OB (TV ONLY)  Result Date: 02/28/2020 Patient Name: CAROLYNNE SCHUCHARD DOB: 22-Mar-1991 MRN: [id number] ULTRASOUND REPORT [facility] Date of Service: 02/28/2020 Indications: Follow up left ovarian cyst Findings: The uterus is anteverted and measures 9.1 x 5.8 x 4.4 cm. Echo texture is homogenous without evidence of focal masses. The Endometrium measures 8.6 mm. Normal appearance. Right Ovary measures 3.7 x 2.3 x 2.1 cm. It is normal in appearance. Left Ovary measures 2.6 x 2.7 x 2.0 cm. It is normal in appearance. Survey of the adnexa demonstrates no adnexal masses. There is no free fluid in the cul de sac. Impression: 1. Normal pelvic ultrasound. Gweneth Dimitri, RT The ultrasound images and findings were reviewed by me and I agree with the above report. Prentice Docker, MD, [facility], [facility] 02/28/2020 5:00 PM     US BREAST LTD UNI LEFT INC AXILLA  Result Date: 03/14/2020 CLINICAL DATA:  29 year old female presenting with diffuse lateral left breast burning pain. Additionally the patient's physician feels a small nodularity at 2:30 to 3 o'clock in the left breast. EXAM: DIGITAL DIAGNOSTIC BILATERAL MAMMOGRAM WITH CAD AND TOMO ULTRASOUND LEFT BREAST COMPARISON:  None. ACR Breast Density Category b: There are scattered areas of fibroglandular density. FINDINGS: Mammogram: Right breast: No suspicious mass, distortion, or microcalcifications are identified to suggest presence of malignancy. Left breast: A spot compression tomosynthesis view of the retroareolar left breast was performed in addition to  standard views for a possible asymmetry. On the spot imaging the asymmetry resolved. No suspicious mass, distortion, or microcalcifications are identified to suggest presence of malignancy. Specifically there is no finding in the outer left breast to explain the patient's diffuse pain. Mammographic images were processed with CAD. On physical exam, I do not feel a fixed discrete mass in the upper outer quadrant of the left breast. There are no obvious left breast skin changes. Ultrasound: Targeted ultrasound is performed at the site of concern reported by the patient in the left breast at 2:30 to 3 o'clock demonstrating no suspicious cystic or solid mass. Incidentally noted in the outer left breast are a few scattered 1-2 mm benign simple cysts. IMPRESSION: 1. No mammographic or sonographic evidence of malignancy in the outer left breast or other abnormality to explain the patient's breast pain. 2.  No mammographic evidence of malignancy in the right breast. RECOMMENDATION: 1. Clinical follow-up as needed for the left breast pain. We discussed some of the issues regarding breast pain, including limiting caffeine, wearing adequate support, over-the-counter pain medication, low-fat diet, exercise, and ice as needed. 2.  Begin routine annual screening mammography at age 89. I have discussed the findings and recommendations with the patient. If applicable, a reminder letter will be sent to the patient regarding the next appointment. BI-RADS CATEGORY  1: Negative. Electronically Signed   By: Audie Pinto M.D.   On: 03/14/2020 12:18   MM DIAG BREAST TOMO BILATERAL  Result Date: 03/14/2020 CLINICAL DATA:  29 year old female presenting with diffuse lateral left breast burning pain. Additionally the patient's physician feels a small nodularity at 2:30 to 3 o'clock in the left breast. EXAM: DIGITAL DIAGNOSTIC BILATERAL MAMMOGRAM WITH CAD AND TOMO ULTRASOUND LEFT BREAST COMPARISON:  None. ACR Breast Density Category  b: There are scattered areas of fibroglandular density. FINDINGS: Mammogram: Right breast: No suspicious mass, distortion, or microcalcifications are identified to suggest presence of malignancy. Left breast: A spot compression tomosynthesis view of the retroareolar left breast was performed in addition to standard views for a possible asymmetry. On the spot imaging the asymmetry resolved. No suspicious mass, distortion, or microcalcifications are  identified to suggest presence of malignancy. Specifically there is no finding in the outer left breast to explain the patient's diffuse pain. Mammographic images were processed with CAD. On physical exam, I do not feel a fixed discrete mass in the upper outer quadrant of the left breast. There are no obvious left breast skin changes. Ultrasound: Targeted ultrasound is performed at the site of concern reported by the patient in the left breast at 2:30 to 3 o'clock demonstrating no suspicious cystic or solid mass. Incidentally noted in the outer left breast are a few scattered 1-2 mm benign simple cysts. IMPRESSION: 1. No mammographic or sonographic evidence of malignancy in the outer left breast or other abnormality to explain the patient's breast pain. 2.  No mammographic evidence of malignancy in the right breast. RECOMMENDATION: 1. Clinical follow-up as needed for the left breast pain. We discussed some of the issues regarding breast pain, including limiting caffeine, wearing adequate support, over-the-counter pain medication, low-fat diet, exercise, and ice as needed. 2.  Begin routine annual screening mammography at age 70. I have discussed the findings and recommendations with the patient. If applicable, a reminder letter will be sent to the patient regarding the next appointment. BI-RADS CATEGORY  1: Negative. Electronically Signed   By: Audie Pinto M.D.   On: 03/14/2020 12:18    Assessment and plan- Patient is a 29 y.o. female referred for abnormal  SPEP  SPEP done by rheumatology reveals mildly elevated IgA and polyclonal rise in immunoglobulins but no evidence of M protein.  Based on her labs from September 2021 she did not have any evidence of anemia hypercalcemia or renal failure.  Suspect polyclonal protein is secondary to underlying inflammatory state.  She does not have any M protein suggestive of MGUS or multiple myeloma.  Explained to the patient what MGUS and multiple myeloma is.  I will plan to check a CBC with differential, CMP, myeloma panel and serum free light chains and random urine protein electrophoresis.  I will see her back in 2 weeks for a video visit.  If she does not have any clonal protein based on these labs she does not require a follow-up with hematology in the future   Thank you for this kind referral and the opportunity to participate in the care of this patient   Visit Diagnosis 1. Abnormal SPEP     Dr. Randa Evens, MD, MPH [facility] [phone] 03/19/2020

## 2020-03-20 ENCOUNTER — Encounter: Payer: Self-pay | Admitting: Oncology

## 2020-03-20 ENCOUNTER — Telehealth (INDEPENDENT_AMBULATORY_CARE_PROVIDER_SITE_OTHER): Payer: Medicaid Other | Admitting: Gastroenterology

## 2020-03-20 ENCOUNTER — Other Ambulatory Visit: Payer: Self-pay

## 2020-03-20 DIAGNOSIS — R109 Unspecified abdominal pain: Secondary | ICD-10-CM | POA: Diagnosis not present

## 2020-03-20 LAB — KAPPA/LAMBDA LIGHT CHAINS
Kappa free light chain: 24.2 mg/L — ABNORMAL HIGH (ref 3.3–19.4)
Kappa, lambda light chain ratio: 0.84 (ref 0.26–1.65)
Lambda free light chains: 28.8 mg/L — ABNORMAL HIGH (ref 5.7–26.3)

## 2020-03-20 MED ORDER — PANTOPRAZOLE SODIUM 40 MG PO TBEC: 40.0000 mg | DELAYED_RELEASE_TABLET | Freq: Two times a day (BID) | ORAL | 0 refills | Status: DC

## 2020-03-21 LAB — PROTEIN ELECTRO, RANDOM URINE
Albumin ELP, Urine: 0 %
Alpha-1-Globulin, U: 0 %
Alpha-2-Globulin, U: 0 %
Beta Globulin, U: 0 %
Gamma Globulin, U: 0 %
Total Protein, Urine: <4 mg/dL

## 2020-03-21 NOTE — Progress Notes (Signed)
Melodie Bouillon, MD [address]  [address]  [place], [place] [address]  Main: [phone]  Fax: [phone]   Primary Care Physician: Marya Fossa, PA-C  Virtual Visit via Telephone Note  I connected with patient on 03/21/20 at  1:15 PM EST by telephone and verified that I am speaking with the correct person using two identifiers.   I discussed the limitations, risks, security and privacy concerns of performing an evaluation and management service by telephone and the availability of in person appointments. I also discussed with the patient that there may be a patient responsible charge related to this service. The patient expressed understanding and agreed to proceed.  Location of Patient: Home Location of Provider: Home Persons involved: Patient and provider only during the visit (nursing staff and front desk staff was involved in communicating with the patient prior to the appointment, reviewing medications and checking them in)   History of Present Illness: Chief Complaint  Patient presents with  . Follow-up     HPI: MATILDE POTTENGER is a 29 y.o. female here for follow-up of abdominal pain and loose stools.  Metamucil was recommended on last visit.  Patient now states that her pain is in her back instead of her abdomen.  Her recent EGD has been reassuring.  Metamucil has helped bulk her stool  Previous history: Patient reports chronic history of diarrhea with multiple today.  Reports 3-4 loose bowel movements a day.  No blood in stool.  However, did have an episode a few months ago with increasing diarrhea and associated with nausea vomiting, but this has since resolved and she is back to her baseline.  She was evaluated by [facility] and diagnosed with IBS.  Rifaximin was ordered but due to cost, patient was unable to get it.  Patient has never had a colonoscopy.  Current Outpatient Medications  Medication Sig Dispense Refill  . gabapentin (NEURONTIN) 300 MG  capsule Take 300 mg by mouth 2 (two) times daily.    . Multiple Vitamins-Minerals (ALIVE MENS ENERGY PO) Take 1 Dose by mouth daily.    . pantoprazole (PROTONIX) 40 MG tablet Take 1 tablet (40 mg total) by mouth 2 (two) times daily. 60 tablet 0   No current facility-administered medications for this visit.    Allergies as of 03/20/2020 - Review Complete 03/20/2020  Allergen Reaction Noted  . Latex Swelling and Rash 07/21/2017    Review of Systems:    All systems reviewed and negative except where noted in HPI.   Observations/Objective:  Labs: CMP     Component Value Date/Time   NA 138 03/19/2020 1208   NA 140 12/16/2013 0550   K 3.8 03/19/2020 1208   K 3.7 12/16/2013 0550   CL 102 03/19/2020 1208   CL 107 12/16/2013 0550   CO2 24 03/19/2020 1208   CO2 30 12/16/2013 0550   GLUCOSE 89 03/19/2020 1208   GLUCOSE 77 12/16/2013 0550   BUN 9 03/19/2020 1208   BUN 8 12/16/2013 0550   CREATININE 0.62 03/19/2020 1208   CREATININE 0.93 12/16/2013 0550   CALCIUM 9.2 03/19/2020 1208   CALCIUM 7.9 (L) 12/16/2013 0550   PROT 7.7 03/19/2020 1208   PROT 7.0 12/07/2019 1414   PROT 7.1 12/16/2013 0550   ALBUMIN 4.0 03/19/2020 1208   ALBUMIN 4.2 12/07/2019 1414   ALBUMIN 3.4 12/16/2013 0550   AST 20 03/19/2020 1208   AST 33 12/16/2013 0550   ALT 24 03/19/2020 1208  ALT 44 12/16/2013 0550   ALKPHOS 62 03/19/2020 1208   ALKPHOS 65 12/16/2013 0550   BILITOT 0.4 03/19/2020 1208   BILITOT 0.4 12/07/2019 1414   BILITOT 0.4 12/16/2013 0550   GFRNONAA >60 03/19/2020 1208   GFRNONAA >60 12/16/2013 0550   GFRNONAA >60 11/30/2013 0603   GFRAA >60 11/19/2019 2246   GFRAA >60 12/16/2013 0550   GFRAA >60 11/30/2013 0603   Lab Results  Component Value Date   WBC 6.1 03/19/2020   HGB 13.9 03/19/2020   HCT 39.9 03/19/2020   MCV 89.7 03/19/2020   PLT 280 03/19/2020    Imaging Studies: MR BRAIN WO CONTRAST  Result Date: 03/19/2020 CLINICAL DATA:  Multiple sclerosis. Additional  history provided by scanning technologist: Recurring severe headache, tingling in right leg, sensation of body shaking when sleeping for 3 months; symptoms came on after COVID in August; most recent head injury in March 2020. EXAM: MRI HEAD WITHOUT CONTRAST TECHNIQUE: Multiplanar, multiecho pulse sequences of the brain and surrounding structures were obtained without intravenous contrast. COMPARISON:  CT examinations 09/26/2017 and earlier. FINDINGS: Brain: Cerebral volume is normal. No focal parenchymal signal abnormality is identified. There is no acute infarct. No evidence of intracranial mass. No chronic intracranial blood products. No extra-axial fluid collection. No midline shift. Vascular: Expected proximal arterial flow voids. Skull and upper cervical spine: No focal marrow lesion. Sinuses/Orbits: Visualized orbits show no acute finding. Minimal ethmoid sinus mucosal thickening IMPRESSION: Unremarkable non-contrast MRI appearance of the brain. No evidence of acute intracranial abnormality. Minimal ethmoid sinus mucosal thickening. Electronically Signed   By: Jackey Loge DO   On: 03/19/2020 13:33   US PELVIS TRANSVAGINAL NON-OB (TV ONLY)  Result Date: 02/28/2020 Patient Name: DANNETTE KINKAID DOB: 01-Nov-1990 MRN: [id number] ULTRASOUND REPORT Location: [facility] Date of Service: 02/28/2020 Indications: Follow up left ovarian cyst Findings: The uterus is anteverted and measures 9.1 x 5.8 x 4.4 cm. Echo texture is homogenous without evidence of focal masses. The Endometrium measures 8.6 mm. Normal appearance. Right Ovary measures 3.7 x 2.3 x 2.1 cm. It is normal in appearance. Left Ovary measures 2.6 x 2.7 x 2.0 cm. It is normal in appearance. Survey of the adnexa demonstrates no adnexal masses. There is no free fluid in the cul de sac. Impression: 1. Normal pelvic ultrasound. Deanna Artis, RT The ultrasound images and findings were reviewed by me and I agree with the above report. Thomasene Mohair, MD,  [facility], [facility] 02/28/2020 5:00 PM     US BREAST LTD UNI LEFT INC AXILLA  Result Date: 03/14/2020 CLINICAL DATA:  29 year old female presenting with diffuse lateral left breast burning pain. Additionally the patient's physician feels a small nodularity at 2:30 to 3 o'clock in the left breast. EXAM: DIGITAL DIAGNOSTIC BILATERAL MAMMOGRAM WITH CAD AND TOMO ULTRASOUND LEFT BREAST COMPARISON:  None. ACR Breast Density Category b: There are scattered areas of fibroglandular density. FINDINGS: Mammogram: Right breast: No suspicious mass, distortion, or microcalcifications are identified to suggest presence of malignancy. Left breast: A spot compression tomosynthesis view of the retroareolar left breast was performed in addition to standard views for a possible asymmetry. On the spot imaging the asymmetry resolved. No suspicious mass, distortion, or microcalcifications are identified to suggest presence of malignancy. Specifically there is no finding in the outer left breast to explain the patient's diffuse pain. Mammographic images were processed with CAD. On physical exam, I do not feel a fixed discrete mass in the upper outer  quadrant of the left breast. There are no obvious left breast skin changes. Ultrasound: Targeted ultrasound is performed at the site of concern reported by the patient in the left breast at 2:30 to 3 o'clock demonstrating no suspicious cystic or solid mass. Incidentally noted in the outer left breast are a few scattered 1-2 mm benign simple cysts. IMPRESSION: 1. No mammographic or sonographic evidence of malignancy in the outer left breast or other abnormality to explain the patient's breast pain. 2.  No mammographic evidence of malignancy in the right breast. RECOMMENDATION: 1. Clinical follow-up as needed for the left breast pain. We discussed some of the issues regarding breast pain, including limiting caffeine, wearing adequate support, over-the-counter pain  medication, low-fat diet, exercise, and ice as needed. 2.  Begin routine annual screening mammography at age 7. I have discussed the findings and recommendations with the patient. If applicable, a reminder letter will be sent to the patient regarding the next appointment. BI-RADS CATEGORY  1: Negative. Electronically Signed   By: Emmaline Kluver M.D.   On: 03/14/2020 12:18   MM DIAG BREAST TOMO BILATERAL  Result Date: 03/14/2020 CLINICAL DATA:  29 year old female presenting with diffuse lateral left breast burning pain. Additionally the patient's physician feels a small nodularity at 2:30 to 3 o'clock in the left breast. EXAM: DIGITAL DIAGNOSTIC BILATERAL MAMMOGRAM WITH CAD AND TOMO ULTRASOUND LEFT BREAST COMPARISON:  None. ACR Breast Density Category b: There are scattered areas of fibroglandular density. FINDINGS: Mammogram: Right breast: No suspicious mass, distortion, or microcalcifications are identified to suggest presence of malignancy. Left breast: A spot compression tomosynthesis view of the retroareolar left breast was performed in addition to standard views for a possible asymmetry. On the spot imaging the asymmetry resolved. No suspicious mass, distortion, or microcalcifications are identified to suggest presence of malignancy. Specifically there is no finding in the outer left breast to explain the patient's diffuse pain. Mammographic images were processed with CAD. On physical exam, I do not feel a fixed discrete mass in the upper outer quadrant of the left breast. There are no obvious left breast skin changes. Ultrasound: Targeted ultrasound is performed at the site of concern reported by the patient in the left breast at 2:30 to 3 o'clock demonstrating no suspicious cystic or solid mass. Incidentally noted in the outer left breast are a few scattered 1-2 mm benign simple cysts. IMPRESSION: 1. No mammographic or sonographic evidence of malignancy in the outer left breast or other abnormality  to explain the patient's breast pain. 2.  No mammographic evidence of malignancy in the right breast. RECOMMENDATION: 1. Clinical follow-up as needed for the left breast pain. We discussed some of the issues regarding breast pain, including limiting caffeine, wearing adequate support, over-the-counter pain medication, low-fat diet, exercise, and ice as needed. 2.  Begin routine annual screening mammography at age 77. I have discussed the findings and recommendations with the patient. If applicable, a reminder letter will be sent to the patient regarding the next appointment. BI-RADS CATEGORY  1: Negative. Electronically Signed   By: Emmaline Kluver M.D.   On: 03/14/2020 12:18    Assessment and Plan:   JESSIC STANDIFER is a 29 y.o. y/o female previously seen for abdominal pain and loose stools here for follow-up  Assessment and Plan: Patient's pain is not in the abdomen anymore and is back pain.  I have asked her to follow-up with PCP in this regard to see if she would benefit from pain management referral  She is also interested in food allergy testing and she can obtain referral to immunology from her PCP as well  She does not have any symptoms to indicate colonoscopy at this time  She is actually having days without bowel movements and not having any diarrhea to indicate colonoscopy  If symptoms change I have advised her to let us know and she verbalized understanding  Work-up so far has been reassuring  Follow Up Instructions:    I discussed the assessment and treatment plan with the patient. The patient was provided an opportunity to ask questions and all were answered. The patient agreed with the plan and demonstrated an understanding of the instructions.   The patient was advised to call back or seek an in-person evaluation if the symptoms worsen or if the condition fails to improve as anticipated.  I provided 12 minutes of non-face-to-face time during this encounter. Additional time  was spent in reviewing patient's chart, placing orders etc.   Pasty Spillers, MD  Speech recognition software was used to dictate this note.

## 2020-04-05 ENCOUNTER — Encounter: Payer: Self-pay | Admitting: Oncology

## 2020-04-05 ENCOUNTER — Other Ambulatory Visit: Payer: Self-pay

## 2020-04-05 ENCOUNTER — Inpatient Hospital Stay: Payer: Medicaid Other | Attending: Oncology | Admitting: Oncology

## 2020-04-05 DIAGNOSIS — R778 Other specified abnormalities of plasma proteins: Secondary | ICD-10-CM | POA: Diagnosis not present

## 2020-04-05 NOTE — Progress Notes (Signed)
Patients wants to discuss test results

## 2020-04-08 NOTE — Progress Notes (Signed)
I connected with Emily Nash on 04/08/20 at  1:30 PM EST by video enabled telemedicine visit and verified that I am speaking with the correct person using two identifiers.   I discussed the limitations, risks, security and privacy concerns of performing an evaluation and management service by telemedicine and the availability of in-person appointments. I also discussed with the patient that there may be a patient responsible charge related to this service. The patient expressed understanding and agreed to proceed.  There were problems with video connection and was switched to telephone call for >50% of the time  Other persons participating in the visit and their role in the encounter:  none  Patient's location:  home Provider's location:  work  Stage manager Complaint:  Discuss results of bloodwork  History of present illness: Patient is a 30 year old female who was seen by Dr. Maggie Schwalbe from rheumatology for symptoms of polyarthralgia.  As a part of the work-up he ordered SPEP which showed mildly elevated IgA of 41.  There was no M spike observed but immunofixation showed polyclonal increase in immunoglobulins.  CMP from 12/11/2019 was normal.  CBC was normal as well with an H&H of 14.1/29.9.  Patient had Covid infection in August 2021.  Since then she has been having symptoms of Back pain and generalized pain in her joints.  Sometimes she has episodes of palpitations.  She was previously on a keto diet and lost about 30 pounds intentionally.  Results of blood work from 03/19/2020 showed a normal CBC with an H&H of 13.9/29.9.  CMP was within normal limits.  Both kappa and lambda free light chains were mildly elevated with a normal free light chain ratio of 0.84.  Random urine protein electrophoresis showed no M protein  Interval history: Patient continues to have problems with generalized body aches and back pain.  States that there are times when she gets sudden cramps in her abdomen and back and she  is unable to function.  She has seen multiple specialists and no discernible etiology was found   Review of Systems  Constitutional: Positive for malaise/fatigue. Negative for chills, fever and weight loss.  HENT: Negative for congestion, ear discharge and nosebleeds.   Eyes: Negative for blurred vision.  Respiratory: Negative for cough, hemoptysis, sputum production, shortness of breath and wheezing.   Cardiovascular: Negative for chest pain, palpitations, orthopnea and claudication.  Gastrointestinal: Negative for abdominal pain, blood in stool, constipation, diarrhea, heartburn, melena, nausea and vomiting.  Genitourinary: Negative for dysuria, flank pain, frequency, hematuria and urgency.  Musculoskeletal: Positive for back pain. Negative for joint pain and myalgias.  Skin: Negative for rash.  Neurological: Negative for dizziness, tingling, focal weakness, seizures, weakness and headaches.  Endo/Heme/Allergies: Does not bruise/bleed easily.  Psychiatric/Behavioral: Negative for depression and suicidal ideas. The patient does not have insomnia.     Allergies  Allergen Reactions  . Latex Swelling and Rash    Past Medical History:  Diagnosis Date  . Anxiety   . GERD (gastroesophageal reflux disease)    occ-tums prn  . Ovarian cyst   . Tachycardia    PT STATES THAT OCC SHE WILL FEEL HER HR GET REALLY FAST AND FEELS LIKE SHE CANT GET HER BREATH-ALL OF THIS RESOLVES WITHIN A COUPLE OF SECONDS  . Tobacco use    quit 2020    Past Surgical History:  Procedure Laterality Date  . CESAREAN SECTION    . CHOLECYSTECTOMY    . ESOPHAGOGASTRODUODENOSCOPY (EGD) WITH PROPOFOL N/A 10/28/2019  Procedure: ESOPHAGOGASTRODUODENOSCOPY (EGD) WITH PROPOFOL;  Surgeon: [name], MD;  Location: [facility] END[facility]COPY;  Service: Endoscopy;  Laterality: N/A;  . gallbladder remove    . INDUCED ABORTION    . LEEP N/A 07/28/2017   Procedure: LOOP ELECTROSURGICAL EXCISION PROCEDURE (LEEP);  Surgeon:  [name], MD;  Location: [facility] ORS;  Service: Gynecology;  Laterality: N/A;    Social History   Socioeconomic History  . Marital status: Single    Spouse name: Not on file  . Number of children: Not on file  . Years of education: Not on file  . Highest education level: Not on file  Occupational History  . Not on file  Tobacco Use  . Smoking status: Former Smoker    Packs/day: 0.50    Years: 11.00    Pack years: 5.50    Types: Cigarettes    Quit date: 05/22/2017    Years since quitting: 2.8  . Smokeless tobacco: Never Used  Vaping Use  . Vaping Use: Former  Substance and Sexual Activity  . Alcohol use: No  . Drug use: Not Currently  . Sexual activity: Yes  Other Topics Concern  . Not on file  Social History Narrative  . Not on file   Social Determinants of Health   Financial Resource Strain: Not on file  Food Insecurity: Not on file  Transportation Needs: Not on file  Physical Activity: Not on file  Stress: Not on file  Social Connections: Not on file  Intimate Partner Violence: Not on file    Family History  Problem Relation Age of Onset  . Lung cancer Paternal Grandmother   . Breast cancer Neg Hx   . Cancer Neg Hx   . Heart failure Neg Hx   . Diabetes Neg Hx   . Stroke Neg Hx   . Hypertension Neg Hx      Current Outpatient Medications:  .  gabapentin (NEURONTIN) 300 MG capsule, Take 300 mg by mouth daily., Disp: , Rfl:   MR BRAIN WO CONTRAST  Result Date: 03/19/2020 CLINICAL DATA:  Multiple sclerosis. Additional history provided by scanning technologist: Recurring severe headache, tingling in right leg, sensation of body shaking when sleeping for 3 months; symptoms came on after COVID in August; most recent head injury in March 2020. EXAM: MRI HEAD WITHOUT CONTRAST TECHNIQUE: Multiplanar, multiecho pulse sequences of the brain and surrounding structures were obtained without intravenous contrast. COMPARISON:  CT examinations 09/26/2017 and  earlier. FINDINGS: Brain: Cerebral volume is normal. No focal parenchymal signal abnormality is identified. There is no acute infarct. No evidence of intracranial mass. No chronic intracranial blood products. No extra-axial fluid collection. No midline shift. Vascular: Expected proximal arterial flow voids. Skull and upper cervical spine: No focal marrow lesion. Sinuses/Orbits: Visualized orbits show no acute finding. Minimal ethmoid sinus mucosal thickening IMPRESSION: Unremarkable non-contrast MRI appearance of the brain. No evidence of acute intracranial abnormality. Minimal ethmoid sinus mucosal thickening. Electronically Signed   By: Jackey Loge DO   On: 03/19/2020 13:33   US BREAST LTD UNI LEFT INC AXILLA  Result Date: 03/14/2020 CLINICAL DATA:  30 year old female presenting with diffuse lateral left breast burning pain. Additionally the patient's physician feels a small nodularity at 2:30 to 3 o'clock in the left breast. EXAM: DIGITAL DIAGNOSTIC BILATERAL MAMMOGRAM WITH CAD AND TOMO ULTRASOUND LEFT BREAST COMPARISON:  None. ACR Breast Density Category b: There are scattered areas of fibroglandular density. FINDINGS: Mammogram: Right breast: No suspicious mass, distortion, or microcalcifications are identified  to suggest presence of malignancy. Left breast: A spot compression tomosynthesis view of the retroareolar left breast was performed in addition to standard views for a possible asymmetry. On the spot imaging the asymmetry resolved. No suspicious mass, distortion, or microcalcifications are identified to suggest presence of malignancy. Specifically there is no finding in the outer left breast to explain the patient's diffuse pain. Mammographic images were processed with CAD. On physical exam, I do not feel a fixed discrete mass in the upper outer quadrant of the left breast. There are no obvious left breast skin changes. Ultrasound: Targeted ultrasound is performed at the site of concern reported by  the patient in the left breast at 2:30 to 3 o'clock demonstrating no suspicious cystic or solid mass. Incidentally noted in the outer left breast are a few scattered 1-2 mm benign simple cysts. IMPRESSION: 1. No mammographic or sonographic evidence of malignancy in the outer left breast or other abnormality to explain the patient's breast pain. 2.  No mammographic evidence of malignancy in the right breast. RECOMMENDATION: 1. Clinical follow-up as needed for the left breast pain. We discussed some of the issues regarding breast pain, including limiting caffeine, wearing adequate support, over-the-counter pain medication, low-fat diet, exercise, and ice as needed. 2.  Begin routine annual screening mammography at age 13. I have discussed the findings and recommendations with the patient. If applicable, a reminder letter will be sent to the patient regarding the next appointment. BI-RADS CATEGORY  1: Negative. Electronically Signed   By: Emmaline Kluver M.D.   On: 03/14/2020 12:18   MM DIAG BREAST TOMO BILATERAL  Result Date: 03/14/2020 CLINICAL DATA:  30 year old female presenting with diffuse lateral left breast burning pain. Additionally the patient's physician feels a small nodularity at 2:30 to 3 o'clock in the left breast. EXAM: DIGITAL DIAGNOSTIC BILATERAL MAMMOGRAM WITH CAD AND TOMO ULTRASOUND LEFT BREAST COMPARISON:  None. ACR Breast Density Category b: There are scattered areas of fibroglandular density. FINDINGS: Mammogram: Right breast: No suspicious mass, distortion, or microcalcifications are identified to suggest presence of malignancy. Left breast: A spot compression tomosynthesis view of the retroareolar left breast was performed in addition to standard views for a possible asymmetry. On the spot imaging the asymmetry resolved. No suspicious mass, distortion, or microcalcifications are identified to suggest presence of malignancy. Specifically there is no finding in the outer left breast to  explain the patient's diffuse pain. Mammographic images were processed with CAD. On physical exam, I do not feel a fixed discrete mass in the upper outer quadrant of the left breast. There are no obvious left breast skin changes. Ultrasound: Targeted ultrasound is performed at the site of concern reported by the patient in the left breast at 2:30 to 3 o'clock demonstrating no suspicious cystic or solid mass. Incidentally noted in the outer left breast are a few scattered 1-2 mm benign simple cysts. IMPRESSION: 1. No mammographic or sonographic evidence of malignancy in the outer left breast or other abnormality to explain the patient's breast pain. 2.  No mammographic evidence of malignancy in the right breast. RECOMMENDATION: 1. Clinical follow-up as needed for the left breast pain. We discussed some of the issues regarding breast pain, including limiting caffeine, wearing adequate support, over-the-counter pain medication, low-fat diet, exercise, and ice as needed. 2.  Begin routine annual screening mammography at age 89. I have discussed the findings and recommendations with the patient. If applicable, a reminder letter will be sent to the patient regarding the next appointment. BI-RADS  CATEGORY  1: Negative. Electronically Signed   By: Emmaline KluverNancy  Ballantyne M.D.   On: 03/14/2020 12:18    No images are attached to the encounter.   CMP Latest Ref Rng & Units 03/19/2020  Glucose 70 - 99 mg/dL 89  BUN 6 - 20 mg/dL 9  Creatinine 1.610.44 - 0.961.00 mg/dL 0.450.62  Sodium 409135 - 811145 mmol/L 138  Potassium 3.5 - 5.1 mmol/L 3.8  Chloride 98 - 111 mmol/L 102  CO2 22 - 32 mmol/L 24  Calcium 8.9 - 10.3 mg/dL 9.2  Total Protein 6.5 - 8.1 g/dL 7.7  Total Bilirubin 0.3 - 1.2 mg/dL 0.4  Alkaline Phos 38 - 126 U/L 62  AST 15 - 41 U/L 20  ALT 0 - 44 U/L 24   CBC Latest Ref Rng & Units 03/19/2020  WBC 4.0 - 10.5 K/uL 6.1  Hemoglobin 12.0 - 15.0 g/dL 91.413.9  Hematocrit 78.236.0 - 46.0 % 39.9  Platelets 150 - 400 K/uL 280     Assessment and plan: Patient is a 30 year old female referred for abnormal SPEP  Patient noted to have polyclonal increase in immunoglobulins on SPEP but no M protein.  Serum free light chain ratio is normal.  CBC with differential and CMP is normal.  Patient therefore does not have any monoclonal gammopathy and as such this is not explain her ongoing symptoms.  Follow-up instructions: I will repeat CBC, CMP, myeloma panel and serum free light chains 1 more time in 6 months and if they are continuing to show no M protein she does not require any follow-up with me  I discussed the assessment and treatment plan with the patient. The patient was provided an opportunity to ask questions and all were answered. The patient agreed with the plan and demonstrated an understanding of the instructions.   The patient was advised to call back or seek an in-person evaluation if the symptoms worsen or if the condition fails to improve as anticipated.  I provided 20 minutes of non face-to-face telephone visit time during this encounter, and > 50% was spent counseling as documented under my assessment & plan.  Visit Diagnosis: 1. Abnormal SPEP     Dr. Owens SharkArchana Rao, MD, MPH [facility] at [facility] Tel- [phone] 04/08/2020 2:48 PM

## 2020-04-14 ENCOUNTER — Encounter: Payer: Self-pay | Admitting: Emergency Medicine

## 2020-04-14 ENCOUNTER — Other Ambulatory Visit: Payer: Self-pay

## 2020-04-14 ENCOUNTER — Emergency Department
Admission: EM | Admit: 2020-04-14 | Discharge: 2020-04-14 | Disposition: A | Discharge status: Home / Self Care | Payer: Medicaid Other | Attending: Emergency Medicine | Admitting: Emergency Medicine

## 2020-04-14 DIAGNOSIS — R519 Headache, unspecified: Secondary | ICD-10-CM | POA: Diagnosis present

## 2020-04-14 DIAGNOSIS — G43909 Migraine, unspecified, not intractable, without status migrainosus: Secondary | ICD-10-CM | POA: Insufficient documentation

## 2020-04-14 DIAGNOSIS — Z87891 Personal history of nicotine dependence: Secondary | ICD-10-CM | POA: Insufficient documentation

## 2020-04-14 DIAGNOSIS — G43009 Migraine without aura, not intractable, without status migrainosus: Secondary | ICD-10-CM

## 2020-04-14 MED ORDER — ONDANSETRON HCL 8 MG PO TABS: 8.0000 mg | ORAL_TABLET | Freq: Three times a day (TID) | ORAL | 0 refills | Status: DC | PRN

## 2020-04-14 MED ORDER — SODIUM CHLORIDE 0.9 % IV BOLUS (SEPSIS)
1000.0000 mL | Freq: Once | INTRAVENOUS | Status: AC
  Administered 2020-04-14: 1000 mL via INTRAVENOUS

## 2020-04-14 MED ORDER — DIPHENHYDRAMINE HCL 50 MG/ML IJ SOLN
25.0000 mg | Freq: Once | INTRAMUSCULAR | Status: AC
  Administered 2020-04-14: 25 mg via INTRAVENOUS
  Filled 2020-04-14: qty 1

## 2020-04-14 MED ORDER — PROCHLORPERAZINE EDISYLATE 10 MG/2ML IJ SOLN
10.0000 mg | Freq: Once | INTRAMUSCULAR | Status: DC
  Filled 2020-04-14: qty 2

## 2020-04-14 MED ORDER — METOCLOPRAMIDE HCL 5 MG/ML IJ SOLN
10.0000 mg | Freq: Once | INTRAMUSCULAR | Status: AC
  Administered 2020-04-14: 10 mg via INTRAVENOUS
  Filled 2020-04-14: qty 2

## 2020-04-14 MED ORDER — BUTALBITAL-APAP-CAFFEINE 50-325-40 MG PO TABS: 1.0000 | ORAL_TABLET | Freq: Four times a day (QID) | ORAL | 0 refills | Status: DC | PRN

## 2020-04-14 NOTE — Discharge Instructions (Addendum)
You may take Tylenol 1000 mg every 6 hours as needed for pain. I recommend close follow-up with your primary care physician if you continue to have headaches as you may need referral to neurology.

## 2020-04-14 NOTE — ED Notes (Signed)
Pt states would like to be discharged home. Will notify provider.

## 2020-04-14 NOTE — ED Triage Notes (Signed)
Pt arrives via [facility] with c/o HA around 2300 last night at which time pt took 1 gram of tylenol. Pt also took SL Zofran at 0130. Pt denies trauma or injury and reports chronic nerve pain and back pain which she takes gabapentin for at home. Per EMS, VS WDL except HR which was 124 for EMS. Pt is in NAD.

## 2020-04-14 NOTE — ED Provider Notes (Addendum)
[facility] Emergency Department Provider Note  ____________________________________________   Event Date/Time   First MD Initiated Contact with Patient 04/14/20 [phone]     (approximate)  I have reviewed the triage vital signs and the nursing notes.   HISTORY  Chief Complaint Headache    HPI Emily Nash is a 30 y.o. female with history of gastritis who presents to the emergency department with frontal throbbing headache that started last night. She has had photophobia and nausea and vomiting. No numbness, tingling or weakness. No history of head injury. Not on blood thinners. Has had previous migraine headaches before but states does not get headaches frequently. Does have a PCP for follow-up. Tried Tylenol at home without relief. Unable to take NSAIDs due to history of gastritis.        Past Medical History:  Diagnosis Date  . Anxiety   . GERD (gastroesophageal reflux disease)    occ-tums prn  . Ovarian cyst   . Tachycardia    PT STATES THAT OCC SHE WILL FEEL HER HR GET REALLY FAST AND FEELS LIKE SHE CANT GET HER BREATH-ALL OF THIS RESOLVES WITHIN A COUPLE OF SECONDS  . Tobacco use    quit 2020    Patient Active Problem List   Diagnosis Date Noted  . Acute chest pain 11/26/2019  . Anxiety, mild 11/26/2019  . Difficulty sleeping 11/26/2019  . Numbness and tingling of both legs 11/26/2019  . Dysplasia of cervix, high grade CIN 2 07/12/2019    Past Surgical History:  Procedure Laterality Date  . CESAREAN SECTION    . CHOLECYSTECTOMY    . ESOPHAGOGASTRODUODENOSCOPY (EGD) WITH PROPOFOL N/A 10/28/2019   Procedure: ESOPHAGOGASTRODUODENOSCOPY (EGD) WITH PROPOFOL;  Surgeon: [name], MD;  Location: [facility] ENDOSCOPY;  Service: Endoscopy;  Laterality: N/A;  . gallbladder remove    . INDUCED ABORTION    . LEEP N/A 07/28/2017   Procedure: LOOP ELECTROSURGICAL EXCISION PROCEDURE (LEEP);  Surgeon: [name], MD;  Location: [facility] ORS;   Service: Gynecology;  Laterality: N/A;    Prior to Admission medications   Medication Sig Start Date End Date Taking? Authorizing Provider  gabapentin (NEURONTIN) 300 MG capsule Take 300 mg by mouth daily. 02/03/20   [provider]    Allergies Latex  Family History  Problem Relation Age of Onset  . Lung cancer Paternal Grandmother   . Breast cancer Neg Hx   . Cancer Neg Hx   . Heart failure Neg Hx   . Diabetes Neg Hx   . Stroke Neg Hx   . Hypertension Neg Hx     Social History Social History   Tobacco Use  . Smoking status: Former Smoker    Packs/day: 0.50    Years: 11.00    Pack years: 5.50    Types: Cigarettes    Quit date: 05/22/2017    Years since quitting: 2.8  . Smokeless tobacco: Never Used  Vaping Use  . Vaping Use: Former  Substance Use Topics  . Alcohol use: No  . Drug use: Not Currently    Review of Systems Constitutional: No fever. Eyes: No visual changes. ENT: No sore throat. Cardiovascular: Denies chest pain. Respiratory: Denies shortness of breath. Gastrointestinal: No nausea, vomiting, diarrhea. Genitourinary: Negative for dysuria. Musculoskeletal: Negative for back pain. Skin: Negative for rash. Neurological: Negative for focal weakness or numbness.  ____________________________________________   PHYSICAL EXAM:  VITAL SIGNS: ED Triage Vitals  Enc Vitals Group     BP 04/14/20  0304 108/70     Pulse Rate 04/14/20 0304 (!) 107     Resp 04/14/20 0304 18     Temp 04/14/20 0304 97.6 F (36.4 C)     Temp Source 04/14/20 0304 Oral     SpO2 04/14/20 0304 97 %     Weight 04/14/20 0307 182 lb (82.6 kg)     Height 04/14/20 0307 5\' 2"  (1.575 m)     Head Circumference --      Peak Flow --      Pain Score 04/14/20 0306 10     Pain Loc --      Pain Edu? --      Excl. in GC? --    CONSTITUTIONAL: Alert and oriented and responds appropriately to questions. Well-appearing; well-nourished HEAD: Normocephalic EYES: Conjunctivae clear,  pupils appear equal, EOM appear intact ENT: normal nose; moist mucous membranes NECK: Supple, normal ROM, no meningismus CARD: RRR; S1 and S2 appreciated; no murmurs, no clicks, no rubs, no gallops RESP: Normal chest excursion without splinting or tachypnea; breath sounds clear and equal bilaterally; no wheezes, no rhonchi, no rales, no hypoxia or respiratory distress, speaking full sentences ABD/GI: Normal bowel sounds; non-distended; soft, non-tender, no rebound, no guarding, no peritoneal signs, no hepatosplenomegaly BACK: The back appears normal EXT: Normal ROM in all joints; no deformity noted, no edema; no cyanosis SKIN: Normal color for age and race; warm; no rash on exposed skin NEURO: Moves all extremities equally. Normal sensation diffusely.  CN 2-12 grossly intact.  No dysmetria to finger to nose testing bilaterally.  Normal speech.  Normal gait. PSYCH: The patient's mood and manner are appropriate.  ____________________________________________   LABS (all labs ordered are listed, but only abnormal results are displayed)  Labs Reviewed - No data to display ____________________________________________  EKG  none ____________________________________________  RADIOLOGY I, Nadalyn Deringer, personally viewed and evaluated these images (plain radiographs) as part of my medical decision making, as well as reviewing the written report by the radiologist.  ED MD interpretation:  none  Official radiology report(s): No results found.  ____________________________________________   PROCEDURES  Procedure(s) performed (including Critical Care):  Procedures   ____________________________________________   INITIAL IMPRESSION / ASSESSMENT AND PLAN / ED COURSE  As part of my medical decision making, I reviewed the following data within the electronic MEDICAL RECORD NUMBER Nursing notes reviewed and incorporated and Notes from prior ED visits         Patient here with what  sounds like a classic migraine headache. I have low suspicion for intracranial hemorrhage, cavernous sinus thrombosis, meningitis, encephalitis, stroke. I do not feel she needs emergent head imaging at this time. She is well-appearing, afebrile and neurologically intact. I have ordered IV Compazine, fluids and Benadryl. Will reassess after medication. She has a ride home.   Patient uncomfortable with getting IV Compazine after reading about it on the Internet on her phone. I have ordered IV Reglan but she has asked for this to be held as well and has received IV Benadryl. On my reevaluation, patient reports she is sleepy but her headache is not improved at all. Discussed with her this is because she has not received any medications that truly help for migraine. She now agrees to take Reglan. Signed out to oncoming ED provider 04/16/20 PA-C to reassess patient after receiving headache cocktail. Anticipate discharge home once she is feeling better.  I reviewed all nursing notes and pertinent previous records as available.  I have reviewed and  interpreted any EKGs, lab and urine results, imaging (as available).       ____________________________________________   FINAL CLINICAL IMPRESSION(S) / ED DIAGNOSES  Final diagnoses:  Migraine without aura and without status migrainosus, not intractable     ED Discharge Orders    None      *Please note:  Emily Nash was evaluated in Emergency Department on 04/14/2020 for the symptoms described in the history of present illness. She was evaluated in the context of the global COVID-19 pandemic, which necessitated consideration that the patient might be at risk for infection with the SARS-CoV-2 virus that causes COVID-19. Institutional protocols and algorithms that pertain to the evaluation of patients at risk for COVID-19 are in a state of rapid change based on information released by regulatory bodies including the CDC and federal and state  organizations. These policies and algorithms were followed during the patient's care in the ED.  Some ED evaluations and interventions may be delayed as a result of limited staffing during and the pandemic.*   Note:  This document was prepared using Dragon voice recognition software and may include unintentional dictation errors.   Jerrie Gullo, Layla Maw, DO 04/14/20 0730    Kasmira Cacioppo, Layla Maw, DO 04/14/20 3500

## 2020-04-23 ENCOUNTER — Other Ambulatory Visit: Payer: Self-pay | Admitting: Family Medicine

## 2020-04-23 ENCOUNTER — Ambulatory Visit
Admission: RE | Admit: 2020-04-23 | Discharge: 2020-04-23 | Disposition: A | Discharge status: Home / Self Care | Payer: Medicaid Other | Source: Ambulatory Visit | Attending: Family Medicine | Admitting: Family Medicine

## 2020-04-23 ENCOUNTER — Ambulatory Visit
Admission: RE | Admit: 2020-04-23 | Discharge: 2020-04-23 | Disposition: A | Discharge status: Home / Self Care | Payer: Medicaid Other | Attending: Family Medicine | Admitting: Family Medicine

## 2020-04-23 DIAGNOSIS — M545 Low back pain, unspecified: Secondary | ICD-10-CM

## 2020-04-23 DIAGNOSIS — G8929 Other chronic pain: Secondary | ICD-10-CM

## 2020-05-02 ENCOUNTER — Other Ambulatory Visit: Payer: Self-pay

## 2020-05-02 ENCOUNTER — Encounter: Payer: Self-pay | Admitting: Gastroenterology

## 2020-05-02 ENCOUNTER — Ambulatory Visit: Payer: Medicaid Other | Admitting: Gastroenterology

## 2020-05-02 VITALS — BP 125/85 | HR 83 | Temp 98.4°F | Ht 62.0 in | Wt 185.8 lb

## 2020-05-02 DIAGNOSIS — R109 Unspecified abdominal pain: Secondary | ICD-10-CM

## 2020-05-02 DIAGNOSIS — K219 Gastro-esophageal reflux disease without esophagitis: Secondary | ICD-10-CM

## 2020-05-02 MED ORDER — NA SULFATE-K SULFATE-MG SULF 17.5-3.13-1.6 GM/177ML PO SOLN: ORAL | 0 refills | Status: DC

## 2020-05-03 MED ORDER — LINACLOTIDE 145 MCG PO CAPS: 145.0000 ug | ORAL_CAPSULE | Freq: Every day | ORAL | 1 refills | Status: DC

## 2020-05-03 NOTE — Progress Notes (Signed)
Melodie Bouillon, MD [address]  [address]  [place], [place] [address]  Main: [phone]  Fax: [phone]   Primary Care Physician: Marya Fossa, PA-C   Chief Complaint  Patient presents with  . Abdominal Pain    HPI: Emily Nash is a 30 y.o. female returns for follow-up of abdominal pain.  Patient reports abdominal pain in the epigastric region, radiating to the back and also now reports bilateral lower quadrant abdominal pain.  Patient was taking PPI and despite being on full dose therapy for at least a month at a time, patient symptoms continued.  She states symptoms are present even without any anxiety or depression.  Patient states she has been to multiple doctors for various locations of pain throughout the years.  Patient has also tried MiraLAX/Metamucil before without any improvement in her symptoms  Previous history: Patient reports chronic history of diarrhea with multiple today.  Reports 3-4 loose bowel movements a day.  No blood in stool.  However, did have an episode a few months ago with increasing diarrhea and associated with nausea vomiting, but this has since resolved and she is back to her baseline.  She was evaluated by [facility] and diagnosed with IBS.  Rifaximin was ordered but due to cost, patient was unable to get it.  Patient has never had a colonoscopy.  Current Outpatient Medications  Medication Sig Dispense Refill  . cholecalciferol (VITAMIN D) 25 MCG (1000 UNIT) tablet Take 1 tablet by mouth daily.    Marland Kitchen gabapentin (NEURONTIN) 300 MG capsule Take 300 mg by mouth daily.    . Na Sulfate-K Sulfate-Mg Sulf 17.5-3.13-1.6 GM/177ML SOLN At 5 PM the day before procedure take 1 bottle and 5 hours before procedure take 1 bottle. 708 mL 0  . ARIPiprazole (ABILIFY) 2 MG tablet Take 1 tablet by mouth daily. (Patient not taking: Reported on 05/02/2020)    . busPIRone (BUSPAR) 10 MG tablet Take 1 tablet by mouth daily. (Patient not taking: Reported  on 05/02/2020)    . butalbital-acetaminophen-caffeine (FIORICET) 50-325-40 MG tablet Take 1-2 tablets by mouth every 6 (six) hours as needed for headache. (Patient not taking: Reported on 05/02/2020) 20 tablet 0  . clonazePAM (KLONOPIN) 0.25 MG disintegrating tablet Take 0.25 mg by mouth 2 (two) times daily. (Patient not taking: Reported on 05/02/2020)    . metoprolol succinate (TOPROL-XL) 25 MG 24 hr tablet Take 1 tablet by mouth daily. (Patient not taking: Reported on 05/02/2020)    . ondansetron (ZOFRAN) 8 MG tablet Take 1 tablet (8 mg total) by mouth every 8 (eight) hours as needed for nausea or vomiting. (Patient not taking: Reported on 05/02/2020) 20 tablet 0   No current facility-administered medications for this visit.    Allergies as of 05/02/2020 - Review Complete 05/02/2020  Allergen Reaction Noted  . Latex Swelling and Rash 07/21/2017    ROS:  General: Negative for anorexia, weight loss, fever, chills, fatigue, weakness. ENT: Negative for hoarseness, difficulty swallowing , nasal congestion. CV: Negative for chest pain, angina, palpitations, dyspnea on exertion, peripheral edema.  Respiratory: Negative for dyspnea at rest, dyspnea on exertion, cough, sputum, wheezing.  GI: See history of present illness. GU:  Negative for dysuria, hematuria, urinary incontinence, urinary frequency, nocturnal urination.  Endo: Negative for unusual weight change.    Physical Examination:   BP 125/85   Pulse 83   Temp 98.4 F (36.9 C) (Oral)   Ht 5\' 2"  (1.575 m)   Wt 185  lb 12.8 oz (84.3 kg)   LMP 04/16/2020   BMI 33.98 kg/m   General: Well-nourished, well-developed in no acute distress.  Eyes: No icterus. Conjunctivae pink. Mouth: Oropharyngeal mucosa moist and pink , no lesions erythema or exudate. Neck: Supple, Trachea midline Abdomen: Bowel sounds are normal, nontender, nondistended, no hepatosplenomegaly or masses, no abdominal bruits or hernia , no rebound or guarding.   Extremities: No  lower extremity edema. No clubbing or deformities. Neuro: Alert and oriented x 3.  Grossly intact. Skin: Warm and dry, no jaundice.   Psych: Alert and cooperative, normal mood and affect.   Labs: CMP     Component Value Date/Time   NA 138 03/19/2020 1208   NA 140 12/16/2013 0550   K 3.8 03/19/2020 1208   K 3.7 12/16/2013 0550   CL 102 03/19/2020 1208   CL 107 12/16/2013 0550   CO2 24 03/19/2020 1208   CO2 30 12/16/2013 0550   GLUCOSE 89 03/19/2020 1208   GLUCOSE 77 12/16/2013 0550   BUN 9 03/19/2020 1208   BUN 8 12/16/2013 0550   CREATININE 0.62 03/19/2020 1208   CREATININE 0.93 12/16/2013 0550   CALCIUM 9.2 03/19/2020 1208   CALCIUM 7.9 (L) 12/16/2013 0550   PROT 7.7 03/19/2020 1208   PROT 7.0 12/07/2019 1414   PROT 7.1 12/16/2013 0550   ALBUMIN 4.0 03/19/2020 1208   ALBUMIN 4.2 12/07/2019 1414   ALBUMIN 3.4 12/16/2013 0550   AST 20 03/19/2020 1208   AST 33 12/16/2013 0550   ALT 24 03/19/2020 1208   ALT 44 12/16/2013 0550   ALKPHOS 62 03/19/2020 1208   ALKPHOS 65 12/16/2013 0550   BILITOT 0.4 03/19/2020 1208   BILITOT 0.4 12/07/2019 1414   BILITOT 0.4 12/16/2013 0550   GFRNONAA >60 03/19/2020 1208   GFRNONAA >60 12/16/2013 0550   GFRNONAA >60 11/30/2013 0603   GFRAA >60 11/19/2019 2246   GFRAA >60 12/16/2013 0550   GFRAA >60 11/30/2013 0603   Lab Results  Component Value Date   WBC 6.1 03/19/2020   HGB 13.9 03/19/2020   HCT 39.9 03/19/2020   MCV 89.7 03/19/2020   PLT 280 03/19/2020    Imaging Studies: CT abdomen pelvis July 2021 with previous evidence of cholecystectomy.  Adnexal cyst also reported, with resolution seen on pelvic ultrasound in December 2021  Assessment and Plan:   Emily Nash is a 30 y.o. y/o female here for follow-up of abdominal pain  Due to continued patient symptoms, patient interested in endoscopy at this time  Alternative options of conservative management were discussed in detail, including but not limited to medication  management, foregoing endoscopic procedures at this time and others.    Patient has never had a colonoscopy Her abdominal pain continues despite previous upper endoscopy being reassuring but did showed mild chronic inactive gastritis.  Avoid NSAID use such as Ibuprofen, Aleeve, advil, motrin, BC and Goodie powder, Naproxen, Meloxicam and others.   I have discussed alternative options, risks & benefits,  which include, but are not limited to, bleeding, infection, perforation,respiratory complication & drug reaction.  The patient agrees with this plan & written consent will be obtained.   As patient is having 1 to 2 days without bowel movements, followed by loose stools, will start on pharmacologic therapy for constipation as well  Dr Melodie Bouillon

## 2020-05-08 ENCOUNTER — Other Ambulatory Visit
Admission: RE | Admit: 2020-05-08 | Discharge: 2020-05-08 | Disposition: A | Discharge status: Home / Self Care | Payer: Medicaid Other | Source: Ambulatory Visit | Attending: Gastroenterology | Admitting: Gastroenterology

## 2020-05-08 ENCOUNTER — Other Ambulatory Visit: Payer: Self-pay

## 2020-05-08 DIAGNOSIS — Z01812 Encounter for preprocedural laboratory examination: Secondary | ICD-10-CM | POA: Insufficient documentation

## 2020-05-08 DIAGNOSIS — Z20822 Contact with and (suspected) exposure to covid-19: Secondary | ICD-10-CM | POA: Diagnosis not present

## 2020-05-08 LAB — SARS CORONAVIRUS 2 (TAT 6-24 HRS): SARS Coronavirus 2: NEGATIVE

## 2020-05-09 ENCOUNTER — Encounter: Payer: Self-pay | Admitting: Gastroenterology

## 2020-05-10 ENCOUNTER — Encounter
Admission: RE | Disposition: A | Discharge status: Home / Self Care | Payer: Self-pay | Source: Home / Self Care | Attending: Gastroenterology

## 2020-05-10 ENCOUNTER — Ambulatory Visit
Admission: RE | Admit: 2020-05-10 | Discharge: 2020-05-10 | Disposition: A | Discharge status: Home / Self Care | Payer: Medicaid Other | Attending: Gastroenterology | Admitting: Gastroenterology

## 2020-05-10 ENCOUNTER — Ambulatory Visit: Payer: Medicaid Other | Admitting: Anesthesiology

## 2020-05-10 ENCOUNTER — Other Ambulatory Visit: Payer: Self-pay

## 2020-05-10 ENCOUNTER — Encounter: Payer: Self-pay | Admitting: Gastroenterology

## 2020-05-10 DIAGNOSIS — Z801 Family history of malignant neoplasm of trachea, bronchus and lung: Secondary | ICD-10-CM | POA: Insufficient documentation

## 2020-05-10 DIAGNOSIS — R194 Change in bowel habit: Secondary | ICD-10-CM | POA: Diagnosis not present

## 2020-05-10 DIAGNOSIS — K3189 Other diseases of stomach and duodenum: Secondary | ICD-10-CM | POA: Diagnosis not present

## 2020-05-10 DIAGNOSIS — Z91040 Latex allergy status: Secondary | ICD-10-CM | POA: Insufficient documentation

## 2020-05-10 DIAGNOSIS — K219 Gastro-esophageal reflux disease without esophagitis: Secondary | ICD-10-CM | POA: Diagnosis not present

## 2020-05-10 DIAGNOSIS — K2950 Unspecified chronic gastritis without bleeding: Secondary | ICD-10-CM | POA: Insufficient documentation

## 2020-05-10 DIAGNOSIS — K2289 Other specified disease of esophagus: Secondary | ICD-10-CM | POA: Insufficient documentation

## 2020-05-10 DIAGNOSIS — R109 Unspecified abdominal pain: Secondary | ICD-10-CM | POA: Diagnosis not present

## 2020-05-10 DIAGNOSIS — Z87891 Personal history of nicotine dependence: Secondary | ICD-10-CM | POA: Insufficient documentation

## 2020-05-10 DIAGNOSIS — Z79899 Other long term (current) drug therapy: Secondary | ICD-10-CM | POA: Insufficient documentation

## 2020-05-10 HISTORY — PX: COLONOSCOPY WITH PROPOFOL: SHX5780

## 2020-05-10 HISTORY — PX: ESOPHAGOGASTRODUODENOSCOPY (EGD) WITH PROPOFOL: SHX5813

## 2020-05-10 LAB — POCT PREGNANCY, URINE: Preg Test, Ur: NEGATIVE

## 2020-05-10 SURGERY — COLONOSCOPY WITH PROPOFOL
Anesthesia: General

## 2020-05-10 MED ORDER — PROPOFOL 500 MG/50ML IV EMUL
INTRAVENOUS | Status: AC
  Filled 2020-05-10: qty 50

## 2020-05-10 MED ORDER — PROPOFOL 500 MG/50ML IV EMUL
INTRAVENOUS | Status: DC | PRN
  Administered 2020-05-10: 175 ug/kg/min via INTRAVENOUS

## 2020-05-10 MED ORDER — SODIUM CHLORIDE 0.9 % IV SOLN: INTRAVENOUS | Status: DC

## 2020-05-10 MED ORDER — PROPOFOL 10 MG/ML IV BOLUS
INTRAVENOUS | Status: DC | PRN
  Administered 2020-05-10: 30 mg via INTRAVENOUS
  Administered 2020-05-10: 70 mg via INTRAVENOUS
  Administered 2020-05-10: 30 mg via INTRAVENOUS
  Administered 2020-05-10: 50 mg via INTRAVENOUS

## 2020-05-10 MED ORDER — MIDAZOLAM HCL 2 MG/2ML IJ SOLN
INTRAMUSCULAR | Status: AC
  Filled 2020-05-10: qty 2

## 2020-05-10 MED ORDER — LIDOCAINE HCL (CARDIAC) PF 100 MG/5ML IV SOSY
PREFILLED_SYRINGE | INTRAVENOUS | Status: DC | PRN
  Administered 2020-05-10: 50 mg via INTRAVENOUS

## 2020-05-10 MED ORDER — MIDAZOLAM HCL 2 MG/2ML IJ SOLN
INTRAMUSCULAR | Status: DC | PRN
  Administered 2020-05-10 (×2): 1 mg via INTRAVENOUS

## 2020-05-10 NOTE — Transfer of Care (Signed)
Immediate Anesthesia Transfer of Care Note  Patient: Emily Nash  Procedure(s) Performed: COLONOSCOPY WITH PROPOFOL (N/A ) ESOPHAGOGASTRODUODENOSCOPY (EGD) WITH PROPOFOL (N/A )  Patient Location: PACU  Anesthesia Type:General  Level of Consciousness: sedated  Airway & Oxygen Therapy: Patient Spontanous Breathing  Post-op Assessment: Report given to RN and Post -op Vital signs reviewed and stable  Post vital signs: Reviewed and stable  Last Vitals:  Vitals Value Taken Time  BP 93/69 05/10/20 1129  Temp    Pulse 93 05/10/20 1130  Resp 15 05/10/20 1130  SpO2 96 % 05/10/20 1130  Vitals shown include unvalidated device data.  Last Pain:  Vitals:   05/10/20 1129  TempSrc:   PainSc: Asleep         Complications: No complications documented.

## 2020-05-10 NOTE — H&P (Signed)
Melodie Bouillon, MD [address], [place], [place], [address] [address], [place], [place], [address] Phone: [phone]  Fax: [phone]  Primary Care Physician:  Marya Fossa, PA-C   Pre-Procedure History & Physical: HPI:  Emily Nash is a 30 y.o. female is here for a colonoscopy and EGD.   Past Medical History:  Diagnosis Date  . Anxiety   . GERD (gastroesophageal reflux disease)    occ-tums prn  . Ovarian cyst   . Tachycardia    PT STATES THAT OCC SHE WILL FEEL HER HR GET REALLY FAST AND FEELS LIKE SHE CANT GET HER BREATH-ALL OF THIS RESOLVES WITHIN A COUPLE OF SECONDS  . Tobacco use    quit 2020    Past Surgical History:  Procedure Laterality Date  . CESAREAN SECTION    . CHOLECYSTECTOMY    . ESOPHAGOGASTRODUODENOSCOPY (EGD) WITH PROPOFOL N/A 10/28/2019   Procedure: ESOPHAGOGASTRODUODENOSCOPY (EGD) WITH PROPOFOL;  Surgeon: [name], MD;  Location: [facility] ENDOSCOPY;  Service: Endoscopy;  Laterality: N/A;  . gallbladder remove    . INDUCED ABORTION    . LEEP N/A 07/28/2017   Procedure: LOOP ELECTROSURGICAL EXCISION PROCEDURE (LEEP);  Surgeon: [name], MD;  Location: [facility] ORS;  Service: Gynecology;  Laterality: N/A;    Prior to Admission medications   Medication Sig Start Date End Date Taking? Authorizing Provider  cholecalciferol (VITAMIN D) 25 MCG (1000 UNIT) tablet Take 1 tablet by mouth daily. 12/30/19  Yes [provider]  gabapentin (NEURONTIN) 300 MG capsule Take 300 mg by mouth daily. 02/03/20  Yes [provider]  ondansetron (ZOFRAN) 8 MG tablet Take 1 tablet (8 mg total) by mouth every 8 (eight) hours as needed for nausea or vomiting. 04/14/20  Yes Joni Reining, PA-C  ARIPiprazole (ABILIFY) 2 MG tablet Take 1 tablet by mouth daily. Patient not taking: Reported on 05/02/2020 04/24/20   [provider]  busPIRone (BUSPAR) 10 MG tablet Take 1 tablet by mouth daily. Patient not taking:  Reported on 05/02/2020 12/10/19   [provider]  butalbital-acetaminophen-caffeine (FIORICET) 50-325-40 MG tablet Take 1-2 tablets by mouth every 6 (six) hours as needed for headache. Patient not taking: Reported on 05/02/2020 04/14/20 04/14/21  Joni Reining, PA-C  clonazePAM (KLONOPIN) 0.25 MG disintegrating tablet Take 0.25 mg by mouth 2 (two) times daily. Patient not taking: Reported on 05/02/2020 01/06/20   [provider]  linaclotide Karlene Einstein) 145 MCG CAPS capsule Take 1 capsule (145 mcg total) by mouth daily before breakfast. Patient not taking: Reported on 05/10/2020 05/03/20 06/02/20  Pasty Spillers, MD  metoprolol succinate (TOPROL-XL) 25 MG 24 hr tablet Take 1 tablet by mouth daily. Patient not taking: Reported on 05/02/2020 12/27/19   [provider]  Na Sulfate-K Sulfate-Mg Sulf 17.5-3.13-1.6 GM/177ML SOLN At 5 PM the day before procedure take 1 bottle and 5 hours before procedure take 1 bottle. 05/02/20   Pasty Spillers, MD    Allergies as of 05/02/2020 - Review Complete 05/02/2020  Allergen Reaction Noted  . Latex Swelling and Rash 07/21/2017    Family History  Problem Relation Age of Onset  . Lung cancer Paternal Grandmother   . Breast cancer Neg Hx   . Cancer Neg Hx   . Heart failure Neg Hx   . Diabetes Neg Hx   . Stroke Neg Hx   . Hypertension Neg Hx     Social History   Socioeconomic History  . Marital status: Single  Spouse name: Not on file  . Number of children: Not on file  . Years of education: Not on file  . Highest education level: Not on file  Occupational History  . Not on file  Tobacco Use  . Smoking status: Former Smoker    Packs/day: 0.50    Years: 11.00    Pack years: 5.50    Types: Cigarettes    Quit date: 05/22/2017    Years since quitting: 2.9  . Smokeless tobacco: Never Used  Vaping Use  . Vaping Use: Former  Substance and Sexual Activity  . Alcohol use: No  . Drug use: Not Currently  . Sexual activity:  Yes  Other Topics Concern  . Not on file  Social History Narrative  . Not on file   Social Determinants of Health   Financial Resource Strain: Not on file  Food Insecurity: Not on file  Transportation Needs: Not on file  Physical Activity: Not on file  Stress: Not on file  Social Connections: Not on file  Intimate Partner Violence: Not on file    Review of Systems: See HPI, otherwise negative ROS  Physical Exam: BP 104/80   Pulse (!) 110   Temp 98.4 F (36.9 C) (Temporal)   Resp 20   Ht 5\' 2"  (1.575 m)   Wt 83.9 kg   LMP 04/16/2020   SpO2 95%   BMI 33.84 kg/m  General:   Alert,  pleasant and cooperative in NAD Head:  Normocephalic and atraumatic. Neck:  Supple; no masses or thyromegaly. Lungs:  Clear throughout to auscultation, normal respiratory effort.    Heart:  +S1, +S2, Regular rate and rhythm, No edema. Abdomen:  Soft, nontender and nondistended. Normal bowel sounds, without guarding, and without rebound.   Neurologic:  Alert and  oriented x4;  grossly normal neurologically.  Impression/Plan: Emily Nash is here for a colonoscopy to be performed for abdominal pain and EGD for Acid Reflux.  Risks, benefits, limitations, and alternatives regarding the procedures have been reviewed with the patient.  Questions have been answered.  All parties agreeable.   04/18/2020, MD  05/10/2020, 10:34 AM

## 2020-05-10 NOTE — Anesthesia Preprocedure Evaluation (Signed)
Anesthesia Evaluation  Patient identified by MRN, date of birth, ID band Patient awake    Reviewed: Allergy & Precautions, H&P , NPO status , Patient's Chart, lab work & pertinent test results  Airway Mallampati: III       Dental  (+) Poor Dentition, Chipped   Pulmonary neg pulmonary ROS, former smoker,    Pulmonary exam normal breath sounds clear to auscultation       Cardiovascular negative cardio ROS Normal cardiovascular exam Rhythm:Regular Rate:Normal     Neuro/Psych  Headaches, PSYCHIATRIC DISORDERS Anxiety    GI/Hepatic Neg liver ROS, GERD  ,  Endo/Other  negative endocrine ROS  Renal/GU negative Renal ROS  negative genitourinary   Musculoskeletal negative musculoskeletal ROS (+)   Abdominal   Peds negative pediatric ROS (+)  Hematology negative hematology ROS (+)   Anesthesia Other Findings Past Medical History: No date: Anxiety No date: GERD (gastroesophageal reflux disease)     Comment:  occ-tums prn No date: Ovarian cyst No date: Tachycardia     Comment:  PT STATES THAT OCC SHE WILL FEEL HER HR GET REALLY FAST               AND FEELS LIKE SHE CANT GET HER BREATH-ALL OF THIS               RESOLVES WITHIN A COUPLE OF SECONDS No date: Tobacco use     Comment:  quit 2020   Reproductive/Obstetrics negative OB ROS                             Anesthesia Physical Anesthesia Plan  ASA: II  Anesthesia Plan: General   Post-op Pain Management:    Induction: Intravenous  PONV Risk Score and Plan: 3 and Propofol infusion  Airway Management Planned: Nasal Cannula  Additional Equipment:   Intra-op Plan:   Post-operative Plan:   Informed Consent: I have reviewed the patients History and Physical, chart, labs and discussed the procedure including the risks, benefits and alternatives for the proposed anesthesia with the patient or authorized representative who has indicated  his/her understanding and acceptance.     Dental advisory given  Plan Discussed with: Anesthesiologist, Surgeon and CRNA  Anesthesia Plan Comments:         Anesthesia Quick Evaluation

## 2020-05-10 NOTE — Anesthesia Postprocedure Evaluation (Signed)
Anesthesia Post Note  Patient: Emily Nash  Procedure(s) Performed: COLONOSCOPY WITH PROPOFOL (N/A ) ESOPHAGOGASTRODUODENOSCOPY (EGD) WITH PROPOFOL (N/A )  Patient location during evaluation: Endoscopy Anesthesia Type: General Level of consciousness: awake Pain management: pain level controlled Vital Signs Assessment: post-procedure vital signs reviewed and stable Respiratory status: spontaneous breathing Cardiovascular status: stable Postop Assessment: no apparent nausea or vomiting Anesthetic complications: no   No complications documented.   Last Vitals:  Vitals:   05/10/20 1130 05/10/20 1149  BP:  105/86  Pulse: 93   Resp: 15   Temp:    SpO2: 96%     Last Pain:  Vitals:   05/10/20 1159  TempSrc:   PainSc: 0-No pain                 Emilio Math

## 2020-05-10 NOTE — Op Note (Signed)
[facility] Gastroenterology Patient Name: Emily Nash Procedure Date: 05/10/2020 10:44 AM MRN: [id number] Account #: [id number] Date of Birth: 10/23/1990 Admit Type: Outpatient Age: 30 Room: [facility] ENDO ROOM 2 Gender: Female Note Status: Finalized Procedure:             Colonoscopy Indications:           Abdominal pain, Altered bowel habits Providers:             Lener Ventresca B. Maximino Greenland MD, MD Referring MD:          Sallye Lat Md, MD (Referring MD) Medicines:             Monitored Anesthesia Care Complications:         No immediate complications. Procedure:             Pre-Anesthesia Assessment:                        - Prior to the procedure, a History and Physical was                         performed, and patient medications, allergies and                         sensitivities were reviewed. The patient's tolerance                         of previous anesthesia was reviewed.                        - The risks and benefits of the procedure and the                         sedation options and risks were discussed with the                         patient. All questions were answered and informed                         consent was obtained.                        - Patient identification and proposed procedure were                         verified prior to the procedure by the physician, the                         nurse, the anesthetist and the technician. The                         procedure was verified in the pre-procedure area in                         the procedure room in the endoscopy suite.                        - ASA Grade Assessment: II - A patient with mild  systemic disease.                        - After reviewing the risks and benefits, the patient                         was deemed in satisfactory condition to undergo the                         procedure.                        After obtaining informed consent, the colonoscope  was                         passed under direct vision. Throughout the procedure,                         the patient's blood pressure, pulse, and oxygen                         saturations were monitored continuously. The                         Colonoscope was introduced through the anus and                         advanced to the the cecum, identified by appendiceal                         orifice and ileocecal valve. The colonoscopy was                         performed with ease. The patient tolerated the                         procedure well. The quality of the bowel preparation                         was fair. Findings:      The perianal and digital rectal examinations were normal.      The rectum, sigmoid colon, descending colon, transverse colon, ascending       colon and cecum appeared normal. Biopsies for histology were taken with       a cold forceps from the entire colon for evaluation of microscopic       colitis.      The retroflexed view of the distal rectum and anal verge was normal and       showed no anal or rectal abnormalities. Impression:            - Preparation of the colon was fair.                        - The rectum, sigmoid colon, descending colon,                         transverse colon, ascending colon and cecum are                         normal.  Biopsied.                        - The distal rectum and anal verge are normal on                         retroflexion view. Recommendation:        - Await pathology results.                        - Discharge patient to home.                        - Resume previous diet.                        - Continue present medications.                        - Return to primary care physician as previously                         scheduled.                        - The findings and recommendations were discussed with                         the patient.                        - The findings and recommendations were  discussed with                         the patient's family. Procedure Code(s):     --- Professional ---                        [phone], Colonoscopy, flexible; with biopsy, single or                         multiple Diagnosis Code(s):     --- Professional ---                        R10.9, Unspecified abdominal pain CPT copyright 2019 [employer]. All rights reserved. The codes documented in this report are preliminary and upon coder review may  be revised to meet current compliance requirements.  Melodie Bouillon, MD Michel Bickers B. Maximino Greenland MD, MD 05/10/2020 11:31:12 AM This report has been signed electronically. Number of Addenda: 0 Note Initiated On: 05/10/2020 10:44 AM Scope Withdrawal Time: 0 hours 10 minutes 36 seconds  Total Procedure Duration: 0 hours 15 minutes 42 seconds  Estimated Blood Loss:  Estimated blood loss: none.      [facility]

## 2020-05-10 NOTE — Op Note (Signed)
[facility] Gastroenterology Patient Name: Emily Nash Procedure Date: 05/10/2020 10:45 AM MRN: [id number] Account #: [id number] Date of Birth: 1990-04-14 Admit Type: Outpatient Age: 30 Room: [facility] ENDO ROOM 2 Gender: Female Note Status: Finalized Procedure:             Upper GI endoscopy Indications:           Abdominal pain, Heartburn, Altered bowel habits Providers:             Emile Kyllo B. Maximino Greenland MD, MD Referring MD:          Sallye Lat Md, MD (Referring MD) Medicines:             Monitored Anesthesia Care Complications:         No immediate complications. Procedure:             Pre-Anesthesia Assessment:                        - Prior to the procedure, a History and Physical was                         performed, and patient medications, allergies and                         sensitivities were reviewed. The patient's tolerance                         of previous anesthesia was reviewed.                        - The risks and benefits of the procedure and the                         sedation options and risks were discussed with the                         patient. All questions were answered and informed                         consent was obtained.                        - Patient identification and proposed procedure were                         verified prior to the procedure by the physician, the                         nurse, the anesthesiologist, the anesthetist and the                         technician. The procedure was verified in the                         procedure room.                        - ASA Grade Assessment: II - A patient with mild  systemic disease.                        After obtaining informed consent, the endoscope was                         passed under direct vision. Throughout the procedure,                         the patient's blood pressure, pulse, and oxygen                         saturations were  monitored continuously. The Endoscope                         was introduced through the mouth, and advanced to the                         second part of duodenum. The upper GI endoscopy was                         accomplished with ease. The patient tolerated the                         procedure well. Findings:      White nummular lesions were noted in the entire esophagus. Biopsies were       taken with a cold forceps for histology.      The exam of the esophagus was otherwise normal.      Patchy mildly erythematous mucosa without bleeding was found in the       gastric antrum. Biopsies were taken with a cold forceps for histology.       Biopsies were obtained in the gastric body, at the incisura and in the       gastric antrum with cold forceps for histology.      The exam of the stomach was otherwise normal.      The duodenal bulb, second portion of the duodenum and examined duodenum       were normal. Biopsies for histology were taken with a cold forceps for       evaluation of celiac disease. Impression:            - White nummular lesions in esophageal mucosa.                         Biopsied.                        - Erythematous mucosa in the antrum. Biopsied.                        - Normal duodenal bulb, second portion of the duodenum                         and examined duodenum. Biopsied.                        - Biopsies were obtained in the gastric body, at the  incisura and in the gastric antrum. Recommendation:        - Await pathology results.                        - Discharge patient to home (with escort).                        - Advance diet as tolerated.                        - Continue present medications.                        - Patient has a contact number available for                         emergencies. The signs and symptoms of potential                         delayed complications were discussed with the patient.                          Return to normal activities tomorrow. Written                         discharge instructions were provided to the patient.                        - Discharge patient to home (with escort).                        - The findings and recommendations were discussed with                         the patient.                        - The findings and recommendations were discussed with                         the patient's family. Procedure Code(s):     --- Professional ---                        [phone], Esophagogastroduodenoscopy, flexible,                         transoral; with biopsy, single or multiple Diagnosis Code(s):     --- Professional ---                        K22.8, Other specified diseases of esophagus                        K31.89, Other diseases of stomach and duodenum                        R10.9, Unspecified abdominal pain                        R12, Heartburn CPT copyright 2019 American Medical Association. All rights reserved. The codes documented  in this report are preliminary and upon coder review may  be revised to meet current compliance requirements.  Melodie Bouillon, MD Michel Bickers B. Maximino Greenland MD, MD 05/10/2020 11:05:36 AM This report has been signed electronically. Number of Addenda: 0 Note Initiated On: 05/10/2020 10:45 AM Estimated Blood Loss:  Estimated blood loss: none.      [facility]

## 2020-05-11 ENCOUNTER — Encounter: Payer: Self-pay | Admitting: Gastroenterology

## 2020-05-14 LAB — SURGICAL PATHOLOGY

## 2020-05-16 ENCOUNTER — Encounter: Payer: Self-pay | Admitting: Gastroenterology

## 2020-05-23 ENCOUNTER — Other Ambulatory Visit: Payer: Self-pay

## 2020-05-23 ENCOUNTER — Ambulatory Visit: Payer: Medicaid Other | Admitting: Gastroenterology

## 2020-05-23 ENCOUNTER — Encounter: Payer: Self-pay | Admitting: Gastroenterology

## 2020-05-23 VITALS — BP 121/79 | HR 98 | Temp 98.1°F | Ht 62.0 in | Wt 187.0 lb

## 2020-05-23 DIAGNOSIS — R109 Unspecified abdominal pain: Secondary | ICD-10-CM | POA: Diagnosis not present

## 2020-05-23 NOTE — Progress Notes (Signed)
Melodie Bouillon, MD [address]  [address]  [place], [place] [address]  Main: [phone]  Fax: [phone]   Primary Care Physician: Marya Fossa, PA-C   Chief Complaint  Patient presents with  . Abdominal Pain    HPI: YENY SCHMOLL is a 30 y.o. female here for follow-up of abdominal pain.  Patient underwent EGD and colonoscopy with EGD showing gastric erythema, but biopsies negative for H. pylori.  Colonoscopy normal.  Biopsies negative for microscopic colitis.  Patient was prescribed Linzess on last visit but has not started taking it and is taking activity a yogurt twice a day instead.  Started this 4 days ago and reports having better bowel movements with the soft bowel movements every day or every other day.  No blood in stool.  No nausea or vomiting.  Current Outpatient Medications  Medication Sig Dispense Refill  . ARIPiprazole (ABILIFY) 2 MG tablet Take 1 tablet by mouth daily.    . busPIRone (BUSPAR) 10 MG tablet Take 1 tablet by mouth daily.    . butalbital-acetaminophen-caffeine (FIORICET) 50-325-40 MG tablet Take 1-2 tablets by mouth every 6 (six) hours as needed for headache. 20 tablet 0  . cholecalciferol (VITAMIN D) 25 MCG (1000 UNIT) tablet Take 1 tablet by mouth daily.    . clonazePAM (KLONOPIN) 0.25 MG disintegrating tablet Take 0.25 mg by mouth 2 (two) times daily.    Marland Kitchen gabapentin (NEURONTIN) 300 MG capsule Take 300 mg by mouth daily.    Marland Kitchen linaclotide (LINZESS) 145 MCG CAPS capsule Take 1 capsule (145 mcg total) by mouth daily before breakfast. 30 capsule 1  . metoprolol succinate (TOPROL-XL) 25 MG 24 hr tablet Take 1 tablet by mouth daily.    . Na Sulfate-K Sulfate-Mg Sulf 17.5-3.13-1.6 GM/177ML SOLN At 5 PM the day before procedure take 1 bottle and 5 hours before procedure take 1 bottle. 708 mL 0  . nortriptyline (PAMELOR) 10 MG capsule Take 1 capsule by mouth 1 day or 1 dose.    . ondansetron (ZOFRAN) 8 MG tablet Take 1 tablet (8 mg total) by  mouth every 8 (eight) hours as needed for nausea or vomiting. 20 tablet 0  . QUEtiapine (SEROQUEL) 50 MG tablet Take 50 mg by mouth at bedtime.    . rizatriptan (MAXALT) 10 MG tablet TAKE ONE TABLET BY MOUTH WITH ONSET OF HEADACHE, MAY REPEAT IN 2 HOURS, MAX 30 MG PER DAY     No current facility-administered medications for this visit.    Allergies as of 05/23/2020 - Review Complete 05/23/2020  Allergen Reaction Noted  . Latex Swelling and Rash 07/21/2017    ROS:  General: Negative for anorexia, weight loss, fever, chills, fatigue, weakness. ENT: Negative for hoarseness, difficulty swallowing , nasal congestion. CV: Negative for chest pain, angina, palpitations, dyspnea on exertion, peripheral edema.  Respiratory: Negative for dyspnea at rest, dyspnea on exertion, cough, sputum, wheezing.  GI: See history of present illness. GU:  Negative for dysuria, hematuria, urinary incontinence, urinary frequency, nocturnal urination.  Endo: Negative for unusual weight change.    Physical Examination:   BP 121/79   Pulse 98   Temp 98.1 F (36.7 C) (Oral)   Ht 5\' 2"  (1.575 m)   Wt 187 lb (84.8 kg)   BMI 34.20 kg/m   General: Well-nourished, well-developed in no acute distress.  Eyes: No icterus. Conjunctivae pink. Mouth: Oropharyngeal mucosa moist and pink , no lesions erythema or exudate. Neck: Supple, Trachea midline Abdomen: Bowel sounds are  normal, nontender, nondistended, no hepatosplenomegaly or masses, no abdominal bruits or hernia , no rebound or guarding.   Extremities: No lower extremity edema. No clubbing or deformities. Neuro: Alert and oriented x 3.  Grossly intact. Skin: Warm and dry, no jaundice.   Psych: Alert and cooperative, normal mood and affect.   Labs: CMP     Component Value Date/Time   NA 138 03/19/2020 1208   NA 140 12/16/2013 0550   K 3.8 03/19/2020 1208   K 3.7 12/16/2013 0550   CL 102 03/19/2020 1208   CL 107 12/16/2013 0550   CO2 24 03/19/2020  1208   CO2 30 12/16/2013 0550   GLUCOSE 89 03/19/2020 1208   GLUCOSE 77 12/16/2013 0550   BUN 9 03/19/2020 1208   BUN 8 12/16/2013 0550   CREATININE 0.62 03/19/2020 1208   CREATININE 0.93 12/16/2013 0550   CALCIUM 9.2 03/19/2020 1208   CALCIUM 7.9 (L) 12/16/2013 0550   PROT 7.7 03/19/2020 1208   PROT 7.0 12/07/2019 1414   PROT 7.1 12/16/2013 0550   ALBUMIN 4.0 03/19/2020 1208   ALBUMIN 4.2 12/07/2019 1414   ALBUMIN 3.4 12/16/2013 0550   AST 20 03/19/2020 1208   AST 33 12/16/2013 0550   ALT 24 03/19/2020 1208   ALT 44 12/16/2013 0550   ALKPHOS 62 03/19/2020 1208   ALKPHOS 65 12/16/2013 0550   BILITOT 0.4 03/19/2020 1208   BILITOT 0.4 12/07/2019 1414   BILITOT 0.4 12/16/2013 0550   GFRNONAA >60 03/19/2020 1208   GFRNONAA >60 12/16/2013 0550   GFRNONAA >60 11/30/2013 0603   GFRAA >60 11/19/2019 2246   GFRAA >60 12/16/2013 0550   GFRAA >60 11/30/2013 0603   Lab Results  Component Value Date   WBC 6.1 03/19/2020   HGB 13.9 03/19/2020   HCT 39.9 03/19/2020   MCV 89.7 03/19/2020   PLT 280 03/19/2020    Imaging Studies: No results found.  Assessment and Plan:   ZIVA NUNZIATA is a 30 y.o. y/o female here for follow-up of abdominal pain  Okay to use activity a yogurt and if abdominal pain symptoms are not better I have advised her to start taking Linzess as prescribed and she verbalized understanding  No alarm symptoms present  Symptoms are functional in nature and I have asked her to discuss this with her PCP and possibly receiving cognitive behavioral therapy referral if she is interested  I have also reached out to the pathologist in regard to gastric biopsies results showing "abundant mucin and scattered coccoid bacteria" and I am waiting to hear back    Dr Melodie Bouillon

## 2020-10-02 ENCOUNTER — Other Ambulatory Visit: Payer: Self-pay

## 2020-10-02 ENCOUNTER — Inpatient Hospital Stay: Payer: Medicaid Other | Attending: Oncology

## 2020-10-02 DIAGNOSIS — R768 Other specified abnormal immunological findings in serum: Secondary | ICD-10-CM | POA: Insufficient documentation

## 2020-10-02 DIAGNOSIS — R778 Other specified abnormalities of plasma proteins: Secondary | ICD-10-CM

## 2020-10-02 LAB — CBC WITH DIFFERENTIAL/PLATELET
Abs Immature Granulocytes: 0.04 10*3/uL (ref 0.00–0.07)
Basophils Absolute: 0 10*3/uL (ref 0.0–0.1)
Basophils Relative: 0 %
Eosinophils Absolute: 0 10*3/uL (ref 0.0–0.5)
Eosinophils Relative: 0 %
HCT: 41.2 % (ref 36.0–46.0)
Hemoglobin: 14.2 g/dL (ref 12.0–15.0)
Immature Granulocytes: 1 %
Lymphocytes Relative: 18 %
Lymphs Abs: 1.3 10*3/uL (ref 0.7–4.0)
MCH: 30.6 pg (ref 26.0–34.0)
MCHC: 34.5 g/dL (ref 30.0–36.0)
MCV: 88.8 fL (ref 80.0–100.0)
Monocytes Absolute: 0.4 10*3/uL (ref 0.1–1.0)
Monocytes Relative: 5 %
Neutro Abs: 5.7 10*3/uL (ref 1.7–7.7)
Neutrophils Relative %: 76 %
Platelets: 264 10*3/uL (ref 150–400)
RBC: 4.64 MIL/uL (ref 3.87–5.11)
RDW: 11.9 % (ref 11.5–15.5)
WBC: 7.4 10*3/uL (ref 4.0–10.5)
nRBC: 0 % (ref 0.0–0.2)

## 2020-10-02 LAB — COMPREHENSIVE METABOLIC PANEL
ALT: 17 U/L (ref 0–44)
AST: 17 U/L (ref 15–41)
Albumin: 3.9 g/dL (ref 3.5–5.0)
Alkaline Phosphatase: 56 U/L (ref 38–126)
Anion gap: 7 (ref 5–15)
BUN: 10 mg/dL (ref 6–20)
CO2: 25 mmol/L (ref 22–32)
Calcium: 8.9 mg/dL (ref 8.9–10.3)
Chloride: 104 mmol/L (ref 98–111)
Creatinine, Ser: 0.76 mg/dL (ref 0.44–1.00)
GFR, Estimated: >60 mL/min (ref >60)
Glucose, Bld: 100 mg/dL — ABNORMAL HIGH (ref 70–99)
Potassium: 4.2 mmol/L (ref 3.5–5.1)
Sodium: 136 mmol/L (ref 135–145)
Total Bilirubin: 0.4 mg/dL (ref 0.3–1.2)
Total Protein: 7.6 g/dL (ref 6.5–8.1)

## 2020-10-03 LAB — KAPPA/LAMBDA LIGHT CHAINS
Kappa free light chain: 28.8 mg/L — ABNORMAL HIGH (ref 3.3–19.4)
Kappa, lambda light chain ratio: 0.93 (ref 0.26–1.65)
Lambda free light chains: 31 mg/L — ABNORMAL HIGH (ref 5.7–26.3)

## 2020-10-08 ENCOUNTER — Ambulatory Visit: Payer: Medicaid Other | Admitting: Gastroenterology

## 2020-10-08 ENCOUNTER — Encounter: Payer: Self-pay | Admitting: Gastroenterology

## 2020-10-08 ENCOUNTER — Inpatient Hospital Stay: Payer: Medicaid Other | Admitting: Oncology

## 2020-10-08 ENCOUNTER — Telehealth: Payer: Self-pay | Admitting: Gastroenterology

## 2020-10-08 ENCOUNTER — Other Ambulatory Visit: Payer: Self-pay

## 2020-10-08 VITALS — BP 97/63 | HR 104 | Temp 98.2°F | Ht 62.0 in | Wt 196.6 lb

## 2020-10-08 DIAGNOSIS — R1011 Right upper quadrant pain: Secondary | ICD-10-CM

## 2020-10-08 DIAGNOSIS — K5900 Constipation, unspecified: Secondary | ICD-10-CM

## 2020-10-08 LAB — MULTIPLE MYELOMA PANEL, SERUM
Albumin SerPl Elph-Mcnc: 4 g/dL (ref 2.9–4.4)
Albumin/Glob SerPl: 1.4 (ref 0.7–1.7)
Alpha 1: 0.2 g/dL (ref 0.0–0.4)
Alpha2 Glob SerPl Elph-Mcnc: 0.6 g/dL (ref 0.4–1.0)
B-Globulin SerPl Elph-Mcnc: 1.1 g/dL (ref 0.7–1.3)
Gamma Glob SerPl Elph-Mcnc: 1.2 g/dL (ref 0.4–1.8)
Globulin, Total: 3 g/dL (ref 2.2–3.9)
IgA: 453 mg/dL — ABNORMAL HIGH (ref 87–352)
IgG (Immunoglobin G), Serum: 1251 mg/dL (ref 586–1602)
IgM (Immunoglobulin M), Srm: 86 mg/dL (ref 26–217)
Total Protein ELP: 7 g/dL (ref 6.0–8.5)

## 2020-10-08 NOTE — Progress Notes (Signed)
Melodie Bouillon, MD [address]  [address]  [place], [place] [address]  Main: [phone]  Fax: [phone]   Primary Care Physician: Marya Fossa, PA-C   Chief Complaint  Patient presents with   Follow-up    Abdominal pain and bloating... pt reports x 2 nights she awoke with adb pain--felt similar to gall bladder attack/spasm---has been removed... pain lasted x 1 hour... RUQ pain w/ radiation to back... constipation... nausea... BM small QD, but has not been eating as much... denies blood in stools...     HPI: Emily Nash is a 30 y.o. female here for follow-up of abdominal pain.  Patient has body wide pain, and other complaints such as headaches, migraines, and has been recently evaluated by rheumatology for her symptoms.  Their note in Care Everywhere was reviewed and they had recommended Cymbalta for her multiple symptoms for fibromyalgia.  Patient states that she could not sleep after taking the Cymbalta and stopped taking it.  Same thing happened with amitriptyline so she stopped taking that as well.  On previous visits, Linzess has been prescribed due to her constipation and she has continued to not take this  She reports having an episode of right upper quadrant abdominal pain, sharp, radiating to the back, 7/10, a few weeks ago.  She did not go to the ER at this time.  Symptoms self resolved after that.  She states it felt like a gallbladder attack but she has had cholecystectomy years ago.  After googling her symptoms, she is questioning if we can do SIBO testing or sphincter of Oddi work-up.   ROS: All ROS reviewed and negative except as per HPI   Past Medical History:  Diagnosis Date   Anxiety    GERD (gastroesophageal reflux disease)    occ-tums prn   Ovarian cyst    Tachycardia    PT STATES THAT OCC SHE WILL FEEL HER HR GET REALLY FAST AND FEELS LIKE SHE CANT GET HER BREATH-ALL OF THIS RESOLVES WITHIN A COUPLE OF SECONDS   Tobacco use    quit 2020     Past Surgical History:  Procedure Laterality Date   CESAREAN SECTION     CHOLECYSTECTOMY     COLONOSCOPY WITH PROPOFOL N/A 05/10/2020   Procedure: COLONOSCOPY WITH PROPOFOL;  Surgeon: Pasty Spillers, MD;  Location: [facility] ENDOSCOPY;  Service: Endoscopy;  Laterality: N/A;   ESOPHAGOGASTRODUODENOSCOPY (EGD) WITH PROPOFOL N/A 10/28/2019   Procedure: ESOPHAGOGASTRODUODENOSCOPY (EGD) WITH PROPOFOL;  Surgeon: [name], MD;  Location: [facility] ENDOSCOPY;  Service: Endoscopy;  Laterality: N/A;   ESOPHAGOGASTRODUODENOSCOPY (EGD) WITH PROPOFOL N/A 05/10/2020   Procedure: ESOPHAGOGASTRODUODENOSCOPY (EGD) WITH PROPOFOL;  Surgeon: Pasty Spillers, MD;  Location: [facility] ENDOSCOPY;  Service: Endoscopy;  Laterality: N/A;   gallbladder remove     INDUCED ABORTION     LEEP N/A 07/28/2017   Procedure: LOOP ELECTROSURGICAL EXCISION PROCEDURE (LEEP);  Surgeon: [name], MD;  Location: [facility] ORS;  Service: Gynecology;  Laterality: N/A;    Prior to Admission medications   Medication Sig Start Date End Date Taking? Authorizing Provider  gabapentin (NEURONTIN) 300 MG capsule Take 300 mg by mouth at bedtime.   Yes [provider]    Family History  Problem Relation Age of Onset   Lung cancer Paternal Grandmother    Breast cancer Neg Hx    Cancer Neg Hx    Heart failure Neg Hx    Diabetes Neg Hx    Stroke Neg Hx  Hypertension Neg Hx      Social History   Tobacco Use   Smoking status: Former    Packs/day: 0.50    Years: 11.00    Pack years: 5.50    Types: Cigarettes    Quit date: 05/22/2017    Years since quitting: 3.3   Smokeless tobacco: Never  Vaping Use   Vaping Use: Former  Substance Use Topics   Alcohol use: No   Drug use: Not Currently    Allergies as of 10/08/2020 - Review Complete 10/08/2020  Allergen Reaction Noted   Latex Swelling and Rash 07/21/2017    Physical Examination:  Constitutional: General:   Alert,  Well-developed, well-nourished,  pleasant and cooperative in NAD BP 97/63   Pulse (!) 104   Temp 98.2 F (36.8 C) (Oral)   Ht 5\' 2"  (1.575 m)   Wt 196 lb 9.6 oz (89.2 kg)   BMI 35.96 kg/m   Respiratory: Normal respiratory effort  Gastrointestinal:  Soft, non-tender and non-distended without masses, hepatosplenomegaly or hernias noted.  No guarding or rebound tenderness.     Cardiac: No clubbing or edema.  No cyanosis. Normal posterior tibial pedal pulses noted.  Psych:  Alert and cooperative. Normal mood and affect.  Musculoskeletal:  Normal gait. Head normocephalic, atraumatic. Symmetrical without gross deformities. 5/5 Lower extremity strength bilaterally.  Skin: Warm. Intact without significant lesions or rashes. No jaundice.  Neck: Supple, trachea midline  Lymph: No cervical lymphadenopathy  Psych:  Alert and oriented x3, Alert and cooperative. Normal mood and affect.  Labs: CMP     Component Value Date/Time   NA 136 10/02/2020 1026   NA 140 12/16/2013 0550   K 4.2 10/02/2020 1026   K 3.7 12/16/2013 0550   CL 104 10/02/2020 1026   CL 107 12/16/2013 0550   CO2 25 10/02/2020 1026   CO2 30 12/16/2013 0550   GLUCOSE 100 (H) 10/02/2020 1026   GLUCOSE 77 12/16/2013 0550   BUN 10 10/02/2020 1026   BUN 8 12/16/2013 0550   CREATININE 0.76 10/02/2020 1026   CREATININE 0.93 12/16/2013 0550   CALCIUM 8.9 10/02/2020 1026   CALCIUM 7.9 (L) 12/16/2013 0550   PROT 7.6 10/02/2020 1026   PROT 7.0 12/07/2019 1414   PROT 7.1 12/16/2013 0550   ALBUMIN 3.9 10/02/2020 1026   ALBUMIN 4.2 12/07/2019 1414   ALBUMIN 3.4 12/16/2013 0550   AST 17 10/02/2020 1026   AST 33 12/16/2013 0550   ALT 17 10/02/2020 1026   ALT 44 12/16/2013 0550   ALKPHOS 56 10/02/2020 1026   ALKPHOS 65 12/16/2013 0550   BILITOT 0.4 10/02/2020 1026   BILITOT 0.4 12/07/2019 1414   BILITOT 0.4 12/16/2013 0550   GFRNONAA >60 10/02/2020 1026   GFRNONAA >60 12/16/2013 0550   GFRNONAA >60 11/30/2013 0603   GFRAA >60 11/19/2019 2246   GFRAA  >60 12/16/2013 0550   GFRAA >60 11/30/2013 0603   Lab Results  Component Value Date   WBC 7.4 10/02/2020   HGB 14.2 10/02/2020   HCT 41.2 10/02/2020   MCV 88.8 10/02/2020   PLT 264 10/02/2020    Imaging Studies:   Assessment and Plan:   Emily Nash is a 30 y.o. y/o female here for follow-up of abdominal pain  We again discussed the likely functional nature of her symptoms.  I have encouraged her to comply with recommendations by her other providers including medications that they have given her before  However, given that [facility] had previously  recommended trial of rifaximin that patient was unable to fill due to cost, SIBO testing is reasonable and will obtain at this time.  This is done at an outside facility, and will be ordered and scheduled.  However, I did discuss with her that she is not having diarrhea and therefore postcholecystectomy diarrhea treatment is not indicated at this time and usually SIBO would also be expected to cause more diarrhea  I have encouraged her to start Linzess given that she is constipated and this is likely also contributing to her abdominal pain  She has recently had otherwise reassuring work-up  Will obtain right upper quadrant ultrasound for episode of right upper quadrant pain that occurred few weeks ago as well.  Labs are otherwise reassuring including normal CBC and normal liver enzymes in October 02 2020   Dr Melodie Bouillon

## 2020-10-08 NOTE — Telephone Encounter (Signed)
Pt has appt today at 2:45

## 2020-10-08 NOTE — Patient Instructions (Addendum)
Ultrasound has been scheduled for October 24, 2020, you will need to arrive at 7:45 AM for an 8:00 AM appointment.   You cannot have anything to eat or drink after 12 midnight the night before procedure.  Your appointment has been scheduled at [facility]  [address], [place], [place] [address] [phone]  SIBO breath test kit will be sent to you via mail.

## 2020-10-08 NOTE — Telephone Encounter (Signed)
Patient called and still having abdominal pain, bloating, headaches. Patient requesting small intestinal baterial overgrowth test.

## 2020-10-10 ENCOUNTER — Inpatient Hospital Stay (HOSPITAL_BASED_OUTPATIENT_CLINIC_OR_DEPARTMENT_OTHER): Payer: Medicaid Other | Admitting: Oncology

## 2020-10-10 ENCOUNTER — Encounter: Payer: Self-pay | Admitting: Oncology

## 2020-10-10 ENCOUNTER — Other Ambulatory Visit: Payer: Self-pay

## 2020-10-10 DIAGNOSIS — R768 Other specified abnormal immunological findings in serum: Secondary | ICD-10-CM

## 2020-10-10 NOTE — Progress Notes (Signed)
Patient reports all over muscle weakness and aches. She states at night she has an "internal shaking" feeling. She also reports headaches.

## 2020-10-10 NOTE — Progress Notes (Signed)
I connected with Emily Nash on 10/10/20 at 10:30 AM EDT by video enabled telemedicine visit and verified that I am speaking with the correct person using two identifiers.   I discussed the limitations, risks, security and privacy concerns of performing an evaluation and management service by telemedicine and the availability of in-person appointments. I also discussed with the patient that there may be a patient responsible charge related to this service. The patient expressed understanding and agreed to proceed.  Other persons participating in the visit and their role in the encounter:  none  Patient's location:  home Provider's location:  work  Risk analyst Complaint: Discuss results of blood work for elevated free light chains  History of present illness: Patient is a 30 year old female who was seen by Dr. Erskine Squibb from rheumatology for symptoms of polyarthralgia.  As a part of the work-up he ordered SPEP which showed mildly elevated IgA of 41.  There was no M spike observed but immunofixation showed polyclonal increase in immunoglobulins.  CMP from 12/11/2019 was normal.  CBC was normal as well with an H&H of 14.1/29.9.   Patient had Covid infection in August 2021.  Since then she has been having symptoms of Back pain and generalized pain in her joints.  Sometimes she has episodes of palpitations.  She was previously on a keto diet and lost about 30 pounds intentionally.   Results of blood work from 03/19/2020 showed a normal CBC with an H&H of 13.9/29.9.  CMP was within normal limits.  Both kappa and lambda free light chains were mildly elevated with a normal free light chain ratio of 0.84.  Random urine protein electrophoresis showed no M protein  Interval history: Patient continues to have diffuse body aches back pain as well as intermittent cramping abdominal pain.  She feels like she has not been able to eat her normal life because of the symptoms.  She takes gabapentin for her body aches and  also saw GI recently for her abdominal symptoms.  She has not started taking Linzess yet.   Review of Systems  Constitutional:  Positive for malaise/fatigue. Negative for chills, fever and weight loss.  HENT:  Negative for congestion, ear discharge and nosebleeds.   Eyes:  Negative for blurred vision.  Respiratory:  Negative for cough, hemoptysis, sputum production, shortness of breath and wheezing.   Cardiovascular:  Negative for chest pain, palpitations, orthopnea and claudication.  Gastrointestinal:  Positive for abdominal pain. Negative for blood in stool, constipation, diarrhea, heartburn, melena, nausea and vomiting.  Genitourinary:  Negative for dysuria, flank pain, frequency, hematuria and urgency.  Musculoskeletal:  Positive for back pain, joint pain and myalgias.  Skin:  Negative for rash.  Neurological:  Negative for dizziness, tingling, focal weakness, seizures, weakness and headaches.  Endo/Heme/Allergies:  Does not bruise/bleed easily.  Psychiatric/Behavioral:  Negative for depression and suicidal ideas. The patient does not have insomnia.    Allergies  Allergen Reactions   Latex Swelling and Rash    Past Medical History:  Diagnosis Date   Anxiety    GERD (gastroesophageal reflux disease)    occ-tums prn   Ovarian cyst    Tachycardia    PT STATES THAT OCC SHE WILL FEEL HER HR GET REALLY FAST AND FEELS LIKE SHE CANT GET HER BREATH-ALL OF THIS RESOLVES WITHIN A COUPLE OF SECONDS   Tobacco use    quit 2020    Past Surgical History:  Procedure Laterality Date   CESAREAN SECTION     CHOLECYSTECTOMY  COLONOSCOPY WITH PROPOFOL N/A 05/10/2020   Procedure: COLONOSCOPY WITH PROPOFOL;  Surgeon: Virgel Manifold, MD;  Location: [facility];  Service: Endoscopy;  Laterality: N/A;   ESOPHAGOGASTRODUODENOSCOPY (EGD) WITH PROPOFOL N/A 10/28/2019   Procedure: ESOPHAGOGASTRODUODENOSCOPY (EGD) WITH PROPOFOL;  Surgeon: Lesly Rubenstein, MD;  Location: [facility];   Service: Endoscopy;  Laterality: N/A;   ESOPHAGOGASTRODUODENOSCOPY (EGD) WITH PROPOFOL N/A 05/10/2020   Procedure: ESOPHAGOGASTRODUODENOSCOPY (EGD) WITH PROPOFOL;  Surgeon: Virgel Manifold, MD;  Location: [facility];  Service: Endoscopy;  Laterality: N/A;   gallbladder remove     INDUCED ABORTION     LEEP N/A 07/28/2017   Procedure: LOOP ELECTROSURGICAL EXCISION PROCEDURE (LEEP);  Surgeon: Homero Fellers, MD;  Location: [facility];  Service: Gynecology;  Laterality: N/A;    Social History   Socioeconomic History   Marital status: Single    Spouse name: Not on file   Number of children: Not on file   Years of education: Not on file   Highest education level: Not on file  Occupational History   Not on file  Tobacco Use   Smoking status: Former    Packs/day: 0.50    Years: 11.00    Pack years: 5.50    Types: Cigarettes    Quit date: 05/22/2017    Years since quitting: 3.3   Smokeless tobacco: Never  Vaping Use   Vaping Use: Former  Substance and Sexual Activity   Alcohol use: No   Drug use: Not Currently   Sexual activity: Yes  Other Topics Concern   Not on file  Social History Narrative   Not on file   Social Determinants of Health   Financial Resource Strain: Not on file  Food Insecurity: Not on file  Transportation Needs: Not on file  Physical Activity: Not on file  Stress: Not on file  Social Connections: Not on file  Intimate Partner Violence: Not on file    Family History  Problem Relation Age of Onset   Lung cancer Paternal Grandmother    Breast cancer Neg Hx    Cancer Neg Hx    Heart failure Neg Hx    Diabetes Neg Hx    Stroke Neg Hx    Hypertension Neg Hx      Current Outpatient Medications:    gabapentin (NEURONTIN) 300 MG capsule, Take 300 mg by mouth at bedtime., Disp: , Rfl:   No results found.  No images are attached to the encounter.   CMP Latest Ref Rng & Units 10/02/2020  Glucose 70 - 99 mg/dL 100(H)  BUN 6 - 20 mg/dL 10   Creatinine 0.44 - 1.00 mg/dL 0.76  Sodium 135 - 145 mmol/L 136  Potassium 3.5 - 5.1 mmol/L 4.2  Chloride 98 - 111 mmol/L 104  CO2 22 - 32 mmol/L 25  Calcium 8.9 - 10.3 mg/dL 8.9  Total Protein 6.5 - 8.1 g/dL 7.6  Total Bilirubin 0.3 - 1.2 mg/dL 0.4  Alkaline Phos 38 - 126 U/L 56  AST 15 - 41 U/L 17  ALT 0 - 44 U/L 17   CBC Latest Ref Rng & Units 10/02/2020  WBC 4.0 - 10.5 K/uL 7.4  Hemoglobin 12.0 - 15.0 g/dL 14.2  Hematocrit 36.0 - 46.0 % 41.2  Platelets 150 - 400 K/uL 264     Observation/objective: Appears in no acute distress over video visit today.  Breathing is nonlabored  Assessment and plan: Patient is a 30 year old female referred for polyclonal increase in immunoglobulins  Repeat myeloma  panel again does not show any evidence of M protein.  Mildly elevated IgA levels with SPEP showing polyclonal elevation of immunoglobulins.  Both kappa and lambda free light chain is mildly elevated with a normalFree light chain ratio.  Again explained to the patient that we would be looking at the ratio more than individual free light chains.  Typically in light chain MGUS or multiple myeloma we will see one of the other free light chain being disproportionately elevated.  At this time patient does not have any evidence of MGUS or myeloma.  CBC and CMP are normal.  Follow-up instructions: I will repeat CBC CMP myeloma panel and serum free light chains in 6 months and see her thereafter  I discussed the assessment and treatment plan with the patient. The patient was provided an opportunity to ask questions and all were answered. The patient agreed with the plan and demonstrated an understanding of the instructions.   The patient was advised to call back or seek an in-person evaluation if the symptoms worsen or if the condition fails to improve as anticipated.  Visit Diagnosis: 1. Elevated serum immunoglobulin free light chain level     Dr. Randa Evens, MD, MPH [facility] Tel- [phone] 10/10/2020 12:30 PM

## 2020-10-24 ENCOUNTER — Ambulatory Visit
Admission: RE | Admit: 2020-10-24 | Discharge: 2020-10-24 | Disposition: A | Discharge status: Home / Self Care | Payer: Medicaid Other | Source: Ambulatory Visit | Attending: Gastroenterology | Admitting: Gastroenterology

## 2020-10-24 ENCOUNTER — Other Ambulatory Visit: Payer: Self-pay

## 2020-10-24 DIAGNOSIS — R1011 Right upper quadrant pain: Secondary | ICD-10-CM | POA: Diagnosis present

## 2020-10-30 ENCOUNTER — Telehealth: Payer: Self-pay | Admitting: Gastroenterology

## 2020-10-30 NOTE — Telephone Encounter (Signed)
Patient calling about lab results. Small intestine bacterial results??

## 2020-10-31 MED ORDER — FAMOTIDINE 20 MG PO TABS: 20.0000 mg | ORAL_TABLET | Freq: Every day | ORAL | 1 refills | Status: DC

## 2020-10-31 NOTE — Telephone Encounter (Signed)
Patient verbalized understanding. She wants to know if she can have a refill on the Pepcid 20mg 

## 2020-10-31 NOTE — Telephone Encounter (Signed)
Sent medication to the pharmacy  

## 2020-11-01 ENCOUNTER — Encounter: Payer: Self-pay | Admitting: Gastroenterology

## 2021-01-15 NOTE — Telephone Encounter (Signed)
Patient declined nexplanon at visit.

## 2021-04-12 ENCOUNTER — Other Ambulatory Visit: Payer: Self-pay

## 2021-04-12 ENCOUNTER — Inpatient Hospital Stay: Payer: Medicaid Other | Attending: Oncology

## 2021-04-12 ENCOUNTER — Inpatient Hospital Stay (HOSPITAL_BASED_OUTPATIENT_CLINIC_OR_DEPARTMENT_OTHER): Payer: Medicaid Other | Admitting: Nurse Practitioner

## 2021-04-12 DIAGNOSIS — Z79899 Other long term (current) drug therapy: Secondary | ICD-10-CM | POA: Diagnosis not present

## 2021-04-12 DIAGNOSIS — Z87891 Personal history of nicotine dependence: Secondary | ICD-10-CM | POA: Insufficient documentation

## 2021-04-12 DIAGNOSIS — R768 Other specified abnormal immunological findings in serum: Secondary | ICD-10-CM | POA: Diagnosis present

## 2021-04-12 LAB — COMPREHENSIVE METABOLIC PANEL
ALT: 23 U/L (ref 0–44)
AST: 21 U/L (ref 15–41)
Albumin: 3.8 g/dL (ref 3.5–5.0)
Alkaline Phosphatase: 64 U/L (ref 38–126)
Anion gap: 9 (ref 5–15)
BUN: 13 mg/dL (ref 6–20)
CO2: 25 mmol/L (ref 22–32)
Calcium: 9.1 mg/dL (ref 8.9–10.3)
Chloride: 103 mmol/L (ref 98–111)
Creatinine, Ser: 0.6 mg/dL (ref 0.44–1.00)
GFR, Estimated: >60 mL/min (ref >60)
Glucose, Bld: 89 mg/dL (ref 70–99)
Potassium: 3.8 mmol/L (ref 3.5–5.1)
Sodium: 137 mmol/L (ref 135–145)
Total Bilirubin: 0.4 mg/dL (ref 0.3–1.2)
Total Protein: 7.3 g/dL (ref 6.5–8.1)

## 2021-04-12 LAB — CBC WITH DIFFERENTIAL/PLATELET
Abs Immature Granulocytes: 0.01 10*3/uL (ref 0.00–0.07)
Basophils Absolute: 0 10*3/uL (ref 0.0–0.1)
Basophils Relative: 1 %
Eosinophils Absolute: 0 10*3/uL (ref 0.0–0.5)
Eosinophils Relative: 0 %
HCT: 39.1 % (ref 36.0–46.0)
Hemoglobin: 13.5 g/dL (ref 12.0–15.0)
Immature Granulocytes: 0 %
Lymphocytes Relative: 27 %
Lymphs Abs: 1.8 10*3/uL (ref 0.7–4.0)
MCH: 30.5 pg (ref 26.0–34.0)
MCHC: 34.5 g/dL (ref 30.0–36.0)
MCV: 88.3 fL (ref 80.0–100.0)
Monocytes Absolute: 0.4 10*3/uL (ref 0.1–1.0)
Monocytes Relative: 5 %
Neutro Abs: 4.5 10*3/uL (ref 1.7–7.7)
Neutrophils Relative %: 67 %
Platelets: 325 10*3/uL (ref 150–400)
RBC: 4.43 MIL/uL (ref 3.87–5.11)
RDW: 12 % (ref 11.5–15.5)
WBC: 6.7 10*3/uL (ref 4.0–10.5)
nRBC: 0 % (ref 0.0–0.2)

## 2021-04-12 NOTE — Progress Notes (Signed)
Hematology/Oncology Progress Note  Virtual Visit Progress Note  I connected with Emily Nash on 04/12/21 at  2:45 PM EST by video enabled telemedicine visit and verified that I am speaking with the correct person using two identifiers.   I discussed the limitations, risks, security and privacy concerns of performing an evaluation and management service by telemedicine and the availability of in-person appointments. I also discussed with the patient that there may be a patient responsible charge related to this service. The patient expressed understanding and agreed to proceed.   Other persons participating in the visit and their role in the encounter: none   Patients location: home  Providers location: clinic   Chief Complaint: results of blood work for elevated free light chains  History of present illness: Patient is a 31 year old female who was seen by Dr. Erskine Squibb from rheumatology for symptoms of polyarthralgia.  As a part of the work-up he ordered SPEP which showed mildly elevated IgA of 41.  There was no M spike observed but immunofixation showed polyclonal increase in immunoglobulins.  CMP from 12/11/2019 was normal.  CBC was normal as well with an H&H of 14.1/29.9.   Patient had Covid infection in August 2021.  Since then she has been having symptoms of Back pain and generalized pain in her joints.  Sometimes she has episodes of palpitations. She was previously on a keto diet and lost about 30 pounds intentionally.   Results of blood work from 03/19/2020 showed a normal CBC with an H&H of 13.9/29.9.  CMP was within normal limits.  Both kappa and lambda free light chains were mildly elevated with a normal free light chain ratio of 0.84.  Random urine protein electrophoresis showed no M protein  Interval history: Patient is 31 year old female who agrees to evaluation via telemedicine for follow up for abnormal immunoglobulins and elevated IgA. She continues to have body aches and  back pain. She has several questions about her dignosis and questions cause of her condition.    Review of Systems  Constitutional:  Positive for malaise/fatigue. Negative for chills, fever and weight loss.  HENT:  Negative for congestion, ear discharge and nosebleeds.   Eyes:  Negative for blurred vision.  Respiratory:  Negative for cough, hemoptysis, sputum production, shortness of breath and wheezing.   Cardiovascular:  Negative for chest pain, palpitations, orthopnea and claudication.  Gastrointestinal:  Positive for abdominal pain. Negative for blood in stool, constipation, diarrhea, heartburn, melena, nausea and vomiting.  Genitourinary:  Negative for dysuria, flank pain, frequency, hematuria and urgency.  Musculoskeletal:  Positive for back pain, joint pain and myalgias.  Skin:  Negative for rash.  Neurological:  Negative for dizziness, tingling, focal weakness, seizures, weakness and headaches.  Endo/Heme/Allergies:  Does not bruise/bleed easily.  Psychiatric/Behavioral:  Negative for depression and suicidal ideas. The patient does not have insomnia.    Allergies  Allergen Reactions   Latex Swelling and Rash    Past Medical History:  Diagnosis Date   Anxiety    GERD (gastroesophageal reflux disease)    occ-tums prn   Ovarian cyst    Tachycardia    PT STATES THAT OCC SHE WILL FEEL HER HR GET REALLY FAST AND FEELS LIKE SHE CANT GET HER BREATH-ALL OF THIS RESOLVES WITHIN A COUPLE OF SECONDS   Tobacco use    quit 2020    Past Surgical History:  Procedure Laterality Date   CESAREAN SECTION     CHOLECYSTECTOMY     COLONOSCOPY  WITH PROPOFOL N/A 05/10/2020   Procedure: COLONOSCOPY WITH PROPOFOL;  Surgeon: Virgel Manifold, MD;  Location: [facility];  Service: Endoscopy;  Laterality: N/A;   ESOPHAGOGASTRODUODENOSCOPY (EGD) WITH PROPOFOL N/A 10/28/2019   Procedure: ESOPHAGOGASTRODUODENOSCOPY (EGD) WITH PROPOFOL;  Surgeon: Lesly Rubenstein, MD;  Location: [facility];   Service: Endoscopy;  Laterality: N/A;   ESOPHAGOGASTRODUODENOSCOPY (EGD) WITH PROPOFOL N/A 05/10/2020   Procedure: ESOPHAGOGASTRODUODENOSCOPY (EGD) WITH PROPOFOL;  Surgeon: Virgel Manifold, MD;  Location: [facility];  Service: Endoscopy;  Laterality: N/A;   gallbladder remove     INDUCED ABORTION     LEEP N/A 07/28/2017   Procedure: LOOP ELECTROSURGICAL EXCISION PROCEDURE (LEEP);  Surgeon: Homero Fellers, MD;  Location: [facility];  Service: Gynecology;  Laterality: N/A;    Social History   Socioeconomic History   Marital status: Single    Spouse name: Not on file   Number of children: Not on file   Years of education: Not on file   Highest education level: Not on file  Occupational History   Not on file  Tobacco Use   Smoking status: Former    Packs/day: 0.50    Years: 11.00    Pack years: 5.50    Types: Cigarettes    Quit date: 05/22/2017    Years since quitting: 3.8   Smokeless tobacco: Never  Vaping Use   Vaping Use: Former  Substance and Sexual Activity   Alcohol use: No   Drug use: Not Currently   Sexual activity: Yes  Other Topics Concern   Not on file  Social History Narrative   Not on file   Social Determinants of Health   Financial Resource Strain: Not on file  Food Insecurity: Not on file  Transportation Needs: Not on file  Physical Activity: Not on file  Stress: Not on file  Social Connections: Not on file  Intimate Partner Violence: Not on file    Family History  Problem Relation Age of Onset   Lung cancer Paternal Grandmother    Breast cancer Neg Hx    Cancer Neg Hx    Heart failure Neg Hx    Diabetes Neg Hx    Stroke Neg Hx    Hypertension Neg Hx      Current Outpatient Medications:    famotidine (PEPCID) 20 MG tablet, Take 1 tablet (20 mg total) by mouth daily., Disp: 30 tablet, Rfl: 1   gabapentin (NEURONTIN) 300 MG capsule, Take 300 mg by mouth at bedtime., Disp: , Rfl:   No results found.  Observation/objective: Appears  in no acute distress over video visit today.  Breathing is nonlabored   CMP Latest Ref Rng & Units 04/12/2021  Glucose 70 - 99 mg/dL 89  BUN 6 - 20 mg/dL 13  Creatinine 0.44 - 1.00 mg/dL 0.60  Sodium 135 - 145 mmol/L 137  Potassium 3.5 - 5.1 mmol/L 3.8  Chloride 98 - 111 mmol/L 103  CO2 22 - 32 mmol/L 25  Calcium 8.9 - 10.3 mg/dL 9.1  Total Protein 6.5 - 8.1 g/dL 7.3  Total Bilirubin 0.3 - 1.2 mg/dL 0.4  Alkaline Phos 38 - 126 U/L 64  AST 15 - 41 U/L 21  ALT 0 - 44 U/L 23   CBC Latest Ref Rng & Units 04/12/2021  WBC 4.0 - 10.5 K/uL 6.7  Hemoglobin 12.0 - 15.0 g/dL 13.5  Hematocrit 36.0 - 46.0 % 39.1  Platelets 150 - 400 K/uL 325    Assessment and plan: Patient is a  31 year old female referred for   Elevated Serum immunoglobulin free light chain- MM panel and light chains pending at time of visit. Previously, mildly elevated IgA levels with SPEP showing polyclonal elevation of immunoglobulins.  Both kappa and lambda free light chain is mildly elevated with a normal free light chain ratio.  Lengthy explanation regarding etiology of IgA elevation and elevated immunoglobulins. Reviewed that her kappa lambda light chain ratio has remained normal. She does not have any evidence of MGUS as she has not had a detectible M spike. Typically in light chain MGUS or multiple myeloma we will see one of the other free light chain being disproportionately elevated. Clinically, asymptomatic. CBC and CMP are normal.   Return to clinic  6 mo- lab then Dr. Janese Banks day to week later (virtual appt or in person)- [place]  Visit Diagnosis: 1. Elevated serum immunoglobulin free light chain level     I discussed the assessment and treatment plan with the patient. The patient was provided an opportunity to ask questions and all were answered. The patient agreed with the plan and demonstrated an understanding of the instructions.   The patient was advised to call back or seek an in-person evaluation if the symptoms  worsen or if the condition fails to improve as anticipated.   I spent 20 minutes face-to-face video visit time dedicated to the care of this patient on the date of this encounter to include pre-visit review of hematology notes, labs, face-to-face time with the patient, and post visit ordering of testing/documentation.    Beckey Rutter, DNP, AGNP-C [place] at [facility] 04/12/2021

## 2021-04-12 NOTE — Progress Notes (Signed)
Pt states she just left the building from getting labs drawn but agreeable to being seen earlier with virtual visit. Pt informed provider will reach out to patient likely after labwork results are in.

## 2021-04-12 NOTE — Progress Notes (Signed)
PCP:  Kerri Perches, PA-C   Chief Complaint  Patient presents with   Gynecologic Exam    No concerns     HPI:      [name] is a 31 y.o. [phone] who LMP was Patient's last menstrual period was 04/09/2021 (approximate)., presents today for her annual examination.  Her menses are monthly now, lasting 5 days, no BTB, mild dysmen, improved with heating pad.  Sex activity: single partner, contraception--none. Declines other BC for now.  Nexplanon removed finally.  Last Pap: 07/26/19  ASCUS/neg HPV DNA. S/p colpo bx 9/22 with Dr. Glennon Mac with CIN 1. 05/07/17 Results were: ASCUS /POS HPV DNA. 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 Due for repeat pap today.   Hx of STDs: HSV, HPV  There is no FH of breast cancer. There is no FH of ovarian cancer. The patient does self-breast exams. Had LT breast pain 12/21 and had neg mammo and u/s. Sx intermittent and pt drinks a lot of caffeine.   Tobacco use: stopped smoking last yr Alcohol use: none No drug use.  Exercise: moderately active  She does get adequate calcium but not Vitamin D in her diet.  Past Medical History:  Diagnosis Date   Anxiety    GERD (gastroesophageal reflux disease)    occ-tums prn   Ovarian cyst    Tachycardia    PT STATES THAT OCC SHE WILL FEEL HER HR GET REALLY FAST AND FEELS LIKE SHE CANT GET HER BREATH-ALL OF THIS RESOLVES WITHIN A COUPLE OF SECONDS   Tobacco use    quit 2020    Past Surgical History:  Procedure Laterality Date   CESAREAN SECTION     CHOLECYSTECTOMY     COLONOSCOPY WITH PROPOFOL N/A 05/10/2020   Procedure: COLONOSCOPY WITH PROPOFOL;  Surgeon: Virgel Manifold, MD;  Location: [facility];  Service: Endoscopy;  Laterality: N/A;   ESOPHAGOGASTRODUODENOSCOPY (EGD) WITH PROPOFOL N/A 10/28/2019   Procedure: ESOPHAGOGASTRODUODENOSCOPY (EGD) WITH PROPOFOL;  Surgeon: Lesly Rubenstein, MD;  Location: [facility];  Service: Endoscopy;  Laterality:  N/A;   ESOPHAGOGASTRODUODENOSCOPY (EGD) WITH PROPOFOL N/A 05/10/2020   Procedure: ESOPHAGOGASTRODUODENOSCOPY (EGD) WITH PROPOFOL;  Surgeon: Virgel Manifold, MD;  Location: [facility];  Service: Endoscopy;  Laterality: N/A;   gallbladder remove     INDUCED ABORTION     LEEP N/A 07/28/2017   Procedure: LOOP ELECTROSURGICAL EXCISION PROCEDURE (LEEP);  Surgeon: Homero Fellers, MD;  Location: [facility];  Service: Gynecology;  Laterality: N/A;    Family History  Problem Relation Age of Onset   Lung cancer Paternal Grandmother    Breast cancer Neg Hx    Cancer Neg Hx    Heart failure Neg Hx    Diabetes Neg Hx    Stroke Neg Hx    Hypertension Neg Hx     Social History   Socioeconomic History   Marital status: Single    Spouse name: Not on file   Number of children: Not on file   Years of education: Not on file   Highest education level: Not on file  Occupational History   Not on file  Tobacco Use   Smoking status: Former    Packs/day: 0.50    Years: 11.00    Pack years: 5.50    Types: Cigarettes    Quit date: 05/22/2017    Years since quitting: 3.9   Smokeless tobacco: Never  Vaping Use   Vaping  Use: Former  Substance and Sexual Activity   Alcohol use: No   Drug use: Not Currently   Sexual activity: Yes    Birth control/protection: None  Other Topics Concern   Not on file  Social History Narrative   Not on file   Social Determinants of Health   Financial Resource Strain: Not on file  Food Insecurity: Not on file  Transportation Needs: Not on file  Physical Activity: Not on file  Stress: Not on file  Social Connections: Not on file  Intimate Partner Violence: Not on file    Current Meds  Medication Sig   gabapentin (NEURONTIN) 300 MG capsule Take 300 mg by mouth at bedtime.     ROS:  Review of Systems  Constitutional:  Negative for fatigue, fever and unexpected weight change.  Respiratory:  Negative for cough, shortness of breath and wheezing.    Cardiovascular:  Negative for chest pain, palpitations and leg swelling.  Gastrointestinal:  Negative for blood in stool, constipation, diarrhea, nausea and vomiting.  Endocrine: Negative for cold intolerance, heat intolerance and polyuria.  Genitourinary:  Negative for dyspareunia, dysuria, flank pain, frequency, genital sores, hematuria, menstrual problem, pelvic pain, urgency, vaginal bleeding, vaginal discharge and vaginal pain.  Musculoskeletal:  Negative for back pain, joint swelling and myalgias.  Skin:  Negative for rash.  Neurological:  Positive for headaches. Negative for dizziness, syncope, light-headedness and numbness.  Hematological:  Negative for adenopathy.  Psychiatric/Behavioral:  Negative for agitation, confusion, sleep disturbance and suicidal ideas. The patient is not nervous/anxious.     Objective: BP 110/80    Ht 5\' 2"  (1.575 m)    Wt 214 lb (97.1 kg)    LMP 04/09/2021 (Approximate)    BMI 39.14 kg/m    Physical Exam Constitutional:      Appearance: She is well-developed.  Genitourinary:     Vulva normal.     Right Labia: No rash, tenderness or lesions.    Left Labia: No tenderness, lesions or rash.    No vaginal discharge, erythema or tenderness.      Right Adnexa: not tender and no mass present.    Left Adnexa: not tender and no mass present.    No cervical motion tenderness, friability or polyp.     Uterus is not enlarged or tender.  Breasts:    Right: No mass, nipple discharge, skin change or tenderness.     Left: No mass, nipple discharge, skin change or tenderness.  Neck:     Thyroid: No thyromegaly.  Cardiovascular:     Rate and Rhythm: Normal rate and regular rhythm.     Heart sounds: Normal heart sounds. No murmur heard. Pulmonary:     Effort: Pulmonary effort is normal.     Breath sounds: Normal breath sounds.  Abdominal:     Palpations: Abdomen is soft.     Tenderness: There is no abdominal tenderness. There is no guarding or rebound.   Musculoskeletal:        General: Normal range of motion.     Cervical back: Normal range of motion.  Lymphadenopathy:     Cervical: No cervical adenopathy.  Neurological:     General: No focal deficit present.     Mental Status: She is alert and oriented to person, place, and time.     Cranial Nerves: No cranial nerve deficit.  Skin:    General: Skin is warm and dry.  Psychiatric:        Mood and Affect: Mood normal.  Behavior: Behavior normal.        Thought Content: Thought content normal.        Judgment: Judgment normal.  Vitals reviewed.    Assessment/Plan: Encounter for annual routine gynecological examination  Cervical cancer screening - Plan: Cytology - PAP  Screening for HPV (human papillomavirus) - Plan: Cytology - PAP  Dysplasia of cervix, low grade (CIN 1) - Plan: Cytology - PAP; repeat pap today. Will f/u with results and dispo. Pt thought last colpo/bx was really painful, prefers not to do that again if possible.   Left breast pain--with neg mammo/u/s in past. Neg breast exam today. D/C caffeine. F/u prn.   GYN counsel adequate intake of calcium and vitamin D, diet and exercise.     F/U  Return in about 1 year (around 04/15/2022).  Nonnie Pickney B. Egon Dittus, PA-C 04/15/2021 10:44 AM

## 2021-04-15 ENCOUNTER — Encounter: Payer: Self-pay | Admitting: Obstetrics and Gynecology

## 2021-04-15 ENCOUNTER — Other Ambulatory Visit (HOSPITAL_COMMUNITY)
Admission: RE | Admit: 2021-04-15 | Discharge: 2021-04-15 | Disposition: A | Discharge status: Home / Self Care | Payer: Medicaid Other | Source: Ambulatory Visit | Attending: Obstetrics and Gynecology | Admitting: Obstetrics and Gynecology

## 2021-04-15 ENCOUNTER — Other Ambulatory Visit: Payer: Self-pay

## 2021-04-15 ENCOUNTER — Ambulatory Visit (INDEPENDENT_AMBULATORY_CARE_PROVIDER_SITE_OTHER): Payer: Medicaid Other | Admitting: Obstetrics and Gynecology

## 2021-04-15 VITALS — BP 110/80 | Ht 62.0 in | Wt 214.0 lb

## 2021-04-15 DIAGNOSIS — N870 Mild cervical dysplasia: Secondary | ICD-10-CM | POA: Insufficient documentation

## 2021-04-15 DIAGNOSIS — Z124 Encounter for screening for malignant neoplasm of cervix: Secondary | ICD-10-CM | POA: Insufficient documentation

## 2021-04-15 DIAGNOSIS — Z01419 Encounter for gynecological examination (general) (routine) without abnormal findings: Secondary | ICD-10-CM | POA: Diagnosis not present

## 2021-04-15 DIAGNOSIS — Z1151 Encounter for screening for human papillomavirus (HPV): Secondary | ICD-10-CM | POA: Insufficient documentation

## 2021-04-15 LAB — KAPPA/LAMBDA LIGHT CHAINS
Kappa free light chain: 33.1 mg/L — ABNORMAL HIGH (ref 3.3–19.4)
Kappa, lambda light chain ratio: 0.85 (ref 0.26–1.65)
Lambda free light chains: 38.9 mg/L — ABNORMAL HIGH (ref 5.7–26.3)

## 2021-04-15 NOTE — Patient Instructions (Signed)
I value your feedback and you entrusting us with your care. If you get a [facility] patient survey, I would appreciate you taking the time to let us know about your experience today. Thank you! ? ? ?

## 2021-04-16 LAB — MULTIPLE MYELOMA PANEL, SERUM
Albumin SerPl Elph-Mcnc: 3.5 g/dL (ref 2.9–4.4)
Albumin/Glob SerPl: 1 (ref 0.7–1.7)
Alpha 1: 0.3 g/dL (ref 0.0–0.4)
Alpha2 Glob SerPl Elph-Mcnc: 0.7 g/dL (ref 0.4–1.0)
B-Globulin SerPl Elph-Mcnc: 1.1 g/dL (ref 0.7–1.3)
Gamma Glob SerPl Elph-Mcnc: 1.5 g/dL (ref 0.4–1.8)
Globulin, Total: 3.6 g/dL (ref 2.2–3.9)
IgA: 429 mg/dL — ABNORMAL HIGH (ref 87–352)
IgG (Immunoglobin G), Serum: 1214 mg/dL (ref 586–1602)
IgM (Immunoglobulin M), Srm: 88 mg/dL (ref 26–217)
Total Protein ELP: 7.1 g/dL (ref 6.0–8.5)

## 2021-04-16 LAB — CYTOLOGY - PAP
Comment: NEGATIVE
Diagnosis: NEGATIVE
High risk HPV: NEGATIVE

## 2021-04-25 ENCOUNTER — Other Ambulatory Visit: Payer: Self-pay | Admitting: Family Medicine

## 2021-04-25 DIAGNOSIS — R202 Paresthesia of skin: Secondary | ICD-10-CM

## 2021-04-25 DIAGNOSIS — G43909 Migraine, unspecified, not intractable, without status migrainosus: Secondary | ICD-10-CM

## 2021-07-05 DIAGNOSIS — K9049 Malabsorption due to intolerance, not elsewhere classified: Secondary | ICD-10-CM | POA: Insufficient documentation

## 2021-09-02 ENCOUNTER — Telehealth: Payer: Self-pay

## 2021-09-02 NOTE — Telephone Encounter (Signed)
Pt left msg on our triage line. She found out yesterday she is pregnant, has her NOB appts scheduled already. She has a qs regarding her gabapentin, can she continue to take it?

## 2021-09-03 NOTE — Telephone Encounter (Signed)
Message sent to patient relaying info and instructing patient to reach out with any further questions or concerns.

## 2021-09-09 ENCOUNTER — Ambulatory Visit (INDEPENDENT_AMBULATORY_CARE_PROVIDER_SITE_OTHER): Payer: Medicaid Other

## 2021-09-09 ENCOUNTER — Telehealth: Payer: Self-pay

## 2021-09-09 ENCOUNTER — Other Ambulatory Visit: Payer: Self-pay | Admitting: Licensed Practical Nurse

## 2021-09-09 VITALS — BP 118/78 | HR 76 | Ht 62.0 in | Wt 221.0 lb

## 2021-09-09 VITALS — Wt 221.0 lb

## 2021-09-09 DIAGNOSIS — Z3480 Encounter for supervision of other normal pregnancy, unspecified trimester: Secondary | ICD-10-CM | POA: Insufficient documentation

## 2021-09-09 DIAGNOSIS — Z3689 Encounter for other specified antenatal screening: Secondary | ICD-10-CM

## 2021-09-09 DIAGNOSIS — O0990 Supervision of high risk pregnancy, unspecified, unspecified trimester: Secondary | ICD-10-CM

## 2021-09-09 DIAGNOSIS — N912 Amenorrhea, unspecified: Secondary | ICD-10-CM

## 2021-09-09 DIAGNOSIS — O219 Vomiting of pregnancy, unspecified: Secondary | ICD-10-CM

## 2021-09-09 LAB — POCT URINE PREGNANCY: Preg Test, Ur: POSITIVE — AB

## 2021-09-09 MED ORDER — ONDANSETRON 4 MG PO TBDP: 4.0000 mg | ORAL_TABLET | Freq: Four times a day (QID) | ORAL | 3 refills | Status: DC | PRN

## 2021-09-09 NOTE — Progress Notes (Addendum)
New OB Intake  I connected with  Emily Nash on 09/09/21 at 10:15 AM EDT by telephone Video Visit and verified that I am speaking with the correct person using two identifiers. Nurse is located at [facility] and pt is located at in car in [place] parking lot.  I explained I am completing New OB Intake today. We discussed her EDD of 04/22/2022 that is based on LMP of 07/16/2021. Pt is G5/P2022. I reviewed her allergies, medications, Medical/Surgical/OB history, and appropriate screenings. Based on history, this is a/an pregnancy complicated by placental abruption in previous preg, 11 lb baby at birth who now has autism, size was severely misjudged had epidural three times - it didn't take d/t hot spot in spine, pt is very scared, has heart problems, is nervous about her health, has sxs but no one can dx, is on gabapentin - it's the only thing that works for her - she doesn't sleep well without it and has other issues too; requests rx for zofran for nausea; states it's the only thing that helps her; adv can be rx'd .   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   Labs Discussed genetic screening with patient. Patient aware genetic testing can be drawn with new OB labs. Discussed possible labs to be drawn at new OB appointment.  COVID Vaccine Patient has not had COVID vaccine.   Social Determinants of Health Food Insecurity: denies food insecurity  Transportation: Patient denies transportation needs.  First visit review I reviewed new OB appt with pt. I explained she will have ob bloodwork and pap smear/pelvic exam if indicated. Explained pt will be seen by Paula Compton, CNM at first visit; encounter routed to appropriate provider.   Loran Senters, [facility] 09/09/2021  10:40 AM   Clinical Staff Provider  Office Location  [facility] Dating    Language  English Anatomy US    Flu Vaccine  offer Genetic Screen  NIPS:   TDaP vaccine   offer Hgb A1C or  GTT Early : Third trimester :   Covid declines   LAB RESULTS   Rhogam   Blood Type     Feeding Plan formula Antibody    Contraception Pill or nexplanon Rubella    Circumcision yes RPR     Pediatrician  Phineas Real HBsAg     Support Person  HIV    Prenatal Classes no Varicella     GBS  (For PCN allergy, check sensitivities)   BTL Consent  Hep C   VBAC Consent Wants choice Pap      Hgb Electro  CF      SMA

## 2021-09-09 NOTE — Progress Notes (Signed)
Pt aware rx for zofran is called in. Pt states she also has very bad cramping; adv if it doubles her over or stops her in her tracks she needs to be seen; states it is not that bad.  Pt states she feels like she has to learn the new rules of pregnancy all over again; adv okay to have 16oz of caffeine a day including chocolate as one thing she may not know.  Pt states she will make it thru this preg.  Adv she would.

## 2021-09-09 NOTE — Progress Notes (Signed)
Pt requesting script for Zofran, Unisom and B6 not helping.  Script sent to pharmacy on file Carie Caddy, CNM  [facility]  09/09/21  11:35 AM

## 2021-09-09 NOTE — Telephone Encounter (Signed)
Discussed gabapentin in pregnancy with Dr. Alvester Morin. Advised patient per Dr. Alvester Morin, it is relatively safe to take in pregnancy. There is a small risk for low birth weight as well as possible preterm birth. It's helpful to know she is own it so that these factors can be monitored through out the pregnancy. Also advised that maintaining the lowest dose 100mg  is recommended if at all possible.

## 2021-09-09 NOTE — Progress Notes (Addendum)
Subjective:    Emily Nash is a 31 y.o. female who presents for evaluation of amenorrhea. She believes she could be pregnant.  Patient is still processing. She is more worried about her health.  She is taking Gabapentin to help her stay asleep. She does not take it during the day. She is worried about taking this while pregnant, but also doesn't feel that she can do without it.  Last period was normal.   Patient's last menstrual period was 07/16/2021 (exact date).    Lab Review Urine HCG: positive    Assessment:    Absence of menstruation.     Plan:    Pregnancy Test:  Positive: EDC: 04/22/2022. Briefly discussed positive results and sent to check out for scheduling for New OB appointments. Will discuss Gabapentin with provider and give her a call with results.

## 2021-09-27 ENCOUNTER — Encounter: Payer: Medicaid Other | Admitting: Advanced Practice Midwife

## 2021-09-27 ENCOUNTER — Other Ambulatory Visit: Payer: Self-pay

## 2021-09-27 DIAGNOSIS — O0990 Supervision of high risk pregnancy, unspecified, unspecified trimester: Secondary | ICD-10-CM

## 2021-09-28 LAB — CBC/D/PLT+RPR+RH+ABO+RUBIGG...
Antibody Screen: NEGATIVE
Basophils Absolute: 0 x10E3/uL (ref 0.0–0.2)
Basos: 0 %
EOS (ABSOLUTE): 0 x10E3/uL (ref 0.0–0.4)
Eos: 0 %
HCV Ab: NONREACTIVE
HIV Screen 4th Generation wRfx: NONREACTIVE
Hematocrit: 40 % (ref 34.0–46.6)
Hemoglobin: 13.6 g/dL (ref 11.1–15.9)
Hepatitis B Surface Ag: NEGATIVE
Immature Grans (Abs): 0 x10E3/uL (ref 0.0–0.1)
Immature Granulocytes: 0 %
Lymphocytes Absolute: 1.3 x10E3/uL (ref 0.7–3.1)
Lymphs: 18 %
MCH: 30.6 pg (ref 26.6–33.0)
MCHC: 34 g/dL (ref 31.5–35.7)
MCV: 90 fL (ref 79–97)
Monocytes Absolute: 0.3 x10E3/uL (ref 0.1–0.9)
Monocytes: 4 %
Neutrophils Absolute: 5.6 x10E3/uL (ref 1.4–7.0)
Neutrophils: 78 %
Platelets: 281 x10E3/uL (ref 150–450)
RBC: 4.45 x10E6/uL (ref 3.77–5.28)
RDW: 12.5 % (ref 11.7–15.4)
RPR Ser Ql: NONREACTIVE
Rh Factor: POSITIVE
Rubella Antibodies, IGG: 3.34 {index}
Varicella zoster IgG: 3666 {index}
WBC: 7.3 x10E3/uL (ref 3.4–10.8)

## 2021-09-28 LAB — HCV INTERPRETATION

## 2021-10-03 ENCOUNTER — Ambulatory Visit (INDEPENDENT_AMBULATORY_CARE_PROVIDER_SITE_OTHER): Payer: Medicaid Other | Admitting: Obstetrics

## 2021-10-03 ENCOUNTER — Encounter: Payer: Self-pay | Admitting: Obstetrics

## 2021-10-03 ENCOUNTER — Other Ambulatory Visit (HOSPITAL_COMMUNITY)
Admission: RE | Admit: 2021-10-03 | Discharge: 2021-10-03 | Disposition: A | Discharge status: Home / Self Care | Payer: Medicaid Other | Source: Ambulatory Visit | Attending: Advanced Practice Midwife | Admitting: Advanced Practice Midwife

## 2021-10-03 VITALS — BP 120/80 | Wt 220.0 lb

## 2021-10-03 DIAGNOSIS — O99210 Obesity complicating pregnancy, unspecified trimester: Secondary | ICD-10-CM | POA: Diagnosis present

## 2021-10-03 DIAGNOSIS — Z3A11 11 weeks gestation of pregnancy: Secondary | ICD-10-CM

## 2021-10-03 DIAGNOSIS — Z3480 Encounter for supervision of other normal pregnancy, unspecified trimester: Secondary | ICD-10-CM | POA: Insufficient documentation

## 2021-10-03 DIAGNOSIS — O0990 Supervision of high risk pregnancy, unspecified, unspecified trimester: Secondary | ICD-10-CM

## 2021-10-03 LAB — MATERNIT 21 PLUS CORE, BLOOD
Fetal Fraction: 6
Result (T21): NEGATIVE
Trisomy 13 (Patau syndrome): NEGATIVE
Trisomy 18 (Edwards syndrome): NEGATIVE
Trisomy 21 (Down syndrome): NEGATIVE

## 2021-10-03 LAB — SPECIMEN STATUS REPORT

## 2021-10-03 NOTE — Progress Notes (Signed)
New Obstetric Patient H&P    Chief Complaint: "Desires prenatal care"   History of Present Illness: Patient is a 31 y.o. [id number] Not Hispanic or Latino female, LMP 07/16/2021 presents with amenorrhea and positive home pregnancy test. Based on her  LMP, her EDD is Estimated Date of Delivery: 04/22/22 and her EGA is [redacted]w[redacted]d. Cycles are 7. days, regular, and occur approximately every : 28 days. Her last pap smear was 5 or 6 months ago and was no abnormalities.    She had a urine pregnancy test which was positive about 4  week(s)  ago. Her last menstrual period was normal and lasted for  about 6  day(s). Since her LMP she claims she has experienced fatigue and some anxiety due to previous birth"traumas". She denies vaginal bleeding. Her past medical history is noncontributory. Her prior pregnancies are notable for  vaginal delivery of a 11 lb baby  Since her LMP, she admits to the use of tobacco products  no She claims she has gained   no pounds since the start of her pregnancy.  There are cats in the home in the home  no If yes Outdoor She admits close contact with children on a regular basis  yes  She has had chicken pox in the past no She has had Tuberculosis exposures, symptoms, or previously tested positive for TB   no Current or past history of domestic violence. no  Genetic Screening/Teratology Counseling: (Includes patient, baby's father, or anyone in either family with:)   1. Patient's age >/= 63 at [facility]  no 2. Thalassemia ([place], [place], Mediterranean, or Asian background): MCV<80  no 3. Neural tube defect (meningomyelocele, spina bifida, anencephaly)  no 4. Congenital heart defect  no  5. Down syndrome  no 6. Tay-Sachs (Jewish, [place]  no 7. Canavan's Disease  no 8. Sickle cell disease or trait (African)  no  9. Hemophilia or other blood disorders  no  10. Muscular dystrophy  no  11. Cystic fibrosis  no  12. Huntington's Chorea  no  13. Mental  retardation/autism  yes- her first born is autistic 34. Other inherited genetic or chromosomal disorder  no 15. Maternal metabolic disorder (DM, PKU, etc)  no 16. Patient or FOB with a child with a birth defect not listed above no  16a. Patient or FOB with a birth defect themselves no 17. Recurrent pregnancy loss, or stillbirth  no  18. Any medications since LMP other than prenatal vitamins (include vitamins, supplements, OTC meds, drugs, alcohol)  yes, she takes daily Gabapentin 19. Any other genetic/environmental exposure to discuss  no  Infection History:   1. Lives with someone with TB or TB exposed  no  2. Patient or partner has history of genital herpes  yes 3. Rash or viral illness since LMP  no 4. History of STI (GC, CT, HPV, syphilis, HIV)  no 5. History of recent travel :  no  Other pertinent information:  Yes, due to her previous birth experiences, she would prefer a repeat CS     Review of Systems:10 point review of systems negative unless otherwise noted in HPI  Past Medical History:  Past Medical History:  Diagnosis Date   Anxiety    GERD (gastroesophageal reflux disease)    occ-tums prn   Ovarian cyst    Tachycardia    PT STATES THAT OCC SHE WILL FEEL HER HR GET REALLY FAST AND FEELS LIKE SHE CANT GET HER BREATH-ALL OF  THIS RESOLVES WITHIN A COUPLE OF SECONDS   Tobacco use    quit 2020    Past Surgical History:  Past Surgical History:  Procedure Laterality Date   CESAREAN SECTION     CHOLECYSTECTOMY     COLONOSCOPY WITH PROPOFOL N/A 05/10/2020   Procedure: COLONOSCOPY WITH PROPOFOL;  Surgeon: Pasty Spillers, MD;  Location: [facility] ENDOSCOPY;  Service: Endoscopy;  Laterality: N/A;   ESOPHAGOGASTRODUODENOSCOPY (EGD) WITH PROPOFOL N/A 10/28/2019   Procedure: ESOPHAGOGASTRODUODENOSCOPY (EGD) WITH PROPOFOL;  Surgeon: [name], MD;  Location: [facility] ENDOSCOPY;  Service: Endoscopy;  Laterality: N/A;   ESOPHAGOGASTRODUODENOSCOPY (EGD) WITH PROPOFOL N/A  05/10/2020   Procedure: ESOPHAGOGASTRODUODENOSCOPY (EGD) WITH PROPOFOL;  Surgeon: Pasty Spillers, MD;  Location: [facility] ENDOSCOPY;  Service: Endoscopy;  Laterality: N/A;   gallbladder remove     INDUCED ABORTION     LEEP N/A 07/28/2017   Procedure: LOOP ELECTROSURGICAL EXCISION PROCEDURE (LEEP);  Surgeon: [name], MD;  Location: [facility] ORS;  Service: Gynecology;  Laterality: N/A;    Gynecologic History: Patient's last menstrual period was 07/16/2021 (exact date).  Obstetric History: [id number]  Family History:  Family History  Problem Relation Age of Onset   Lung cancer Paternal Grandmother    Breast cancer Neg Hx    Cancer Neg Hx    Heart failure Neg Hx    Diabetes Neg Hx    Stroke Neg Hx    Hypertension Neg Hx     Social History:  Social History   Socioeconomic History   Marital status: Significant Other    Spouse name: Not on file   Number of children: 2   Years of education: 12   Highest education level: Not on file  Occupational History   Occupation: stay at home mom  Tobacco Use   Smoking status: Former    Packs/day: 0.50    Years: 11.00    Total pack years: 5.50    Types: Cigarettes    Quit date: 05/22/2017    Years since quitting: 4.3   Smokeless tobacco: Never  Vaping Use   Vaping Use: Former  Substance and Sexual Activity   Alcohol use: No   Drug use: Not Currently   Sexual activity: Yes    Birth control/protection: None  Other Topics Concern   Not on file  Social History Narrative   Not on file   Social Determinants of Health   Financial Resource Strain: Low Risk  (09/09/2021)   Overall Financial Resource Strain (CARDIA)    Difficulty of Paying Living Expenses: Not hard at all  Food Insecurity: No Food Insecurity (09/09/2021)   Hunger Vital Sign    Worried About Running Out of Food in the Last Year: Never true    Ran Out of Food in the Last Year: Never true  Transportation Needs: No Transportation Needs (09/09/2021)   PRAPARE -  Administrator, Civil Service (Medical): No    Lack of Transportation (Non-Medical): No  Physical Activity: Inactive (09/09/2021)   Exercise Vital Sign    Days of Exercise per Week: 0 days    Minutes of Exercise per Session: 0 min  Stress: No Stress Concern Present (09/09/2021)   [employer] of Occupational Health - Occupational Stress Questionnaire    Feeling of Stress : Not at all  Social Connections: Unknown (09/09/2021)   Social Connection and Isolation Panel [NHANES]    Frequency of Communication with Friends and Family: More than three times a week    Frequency of  Social Gatherings with Friends and Family: More than three times a week    Attends Religious Services: Not on file    Active Member of Clubs or Organizations: No    Attends Banker Meetings: Never    Marital Status: Living with partner  Intimate Partner Violence: Not At Risk (09/09/2021)   Humiliation, Afraid, Rape, and Kick questionnaire    Fear of Current or Ex-Partner: No    Emotionally Abused: No    Physically Abused: No    Sexually Abused: No    Allergies:  Allergies  Allergen Reactions   Latex Swelling and Rash    Medications: Prior to Admission medications   Medication Sig Start Date End Date Taking? Authorizing Provider  gabapentin (NEURONTIN) 300 MG capsule Take 300 mg by mouth at bedtime.   Yes [provider]  ondansetron (ZOFRAN-ODT) 4 MG disintegrating tablet Take 1 tablet (4 mg total) by mouth every 6 (six) hours as needed for nausea. 09/09/21  Yes Dominic, Courtney Heys, CNM    Physical Exam Vitals: Blood pressure 120/80, weight 220 lb (99.8 kg), last menstrual period 07/16/2021.  General: NAD HEENT: normocephalic, anicteric Thyroid: no enlargement, no palpable nodules Pulmonary: No increased work of breathing, CTAB Cardiovascular: RRR, distal pulses 2+ Abdomen: NABS, soft, non-tender, non-distended.  Umbilicus without lesions.  No hepatomegaly, splenomegaly  or masses palpable. No evidence of hernia  Genitourinary:  External: Normal external female genitalia.  Normal urethral meatus, normal  Bartholin's and Skene's glands.    Vagina: Normal vaginal mucosa, no evidence of prolapse.    Cervix: Grossly normal in appearance, no bleeding  Uterus:  Non-enlarged, mobile, normal contour.  No CMT. No FHTS auscultated today.  Adnexa: ovaries non-enlarged, no adnexal masses  Rectal: deferred Extremities: no edema, erythema, or tenderness Neurologic: Grossly intact Psychiatric: mood appropriate, affect full   Assessment: 31 y.o. [id number] at [redacted]w[redacted]d presenting to initiate prenatal care  Plan: 1) Avoid alcoholic beverages. 2) Patient encouraged not to smoke.  3) Discontinue the use of all non-medicinal drugs and chemicals.  4) Take prenatal vitamins daily.  5) Nutrition, food safety (fish, cheese advisories, and high nitrite foods) and exercise discussed. 6) Hospital and practice style discussed with cross coverage system.  7) Genetic Screening, such as with 1st Trimester Screening, cell free fetal DNA, AFP testing, and Ultrasound, as well as with amniocentesis and CVS as appropriate, is discussed with patient. At the conclusion of today's visit patient already drawn genetic testing 8) Patient is asked about travel to areas at risk for the Zika virus, and counseled to avoid travel and exposure to mosquitoes or sexual partners who may have themselves been exposed to the virus. Testing is discussed, and will be ordered as appropriate.   She expresses a desire for a Cesarean section, due to extreme anxiety related to previous birth experiences. I have suggested we continue this dialogue throughout the pregnancy. Her first baby was over 10 lbs and she still has anxiety related to that delivery. Suggested we look at fetal growth at 36 weeks for reassurance of baby's size and then decide. She is also under specialty care for ongoing pain that is addressed with  Gabapentin. She wants to continue on that medication during the pregnancy-  A dating scan is ordered for her. RTC in 4 weeks.  Mirna Mires, CNM  10/03/2021 6:15 PM

## 2021-10-07 LAB — CERVICOVAGINAL ANCILLARY ONLY
Bacterial Vaginitis (gardnerella): NEGATIVE
Candida Glabrata: NEGATIVE
Candida Vaginitis: NEGATIVE
Chlamydia: NEGATIVE
Comment: NEGATIVE
Comment: NEGATIVE
Comment: NEGATIVE
Comment: NEGATIVE
Comment: NEGATIVE
Comment: NORMAL
Neisseria Gonorrhea: NEGATIVE
Trichomonas: NEGATIVE

## 2021-10-08 LAB — URINE CULTURE

## 2021-10-10 ENCOUNTER — Other Ambulatory Visit: Payer: Self-pay | Admitting: *Deleted

## 2021-10-10 DIAGNOSIS — R768 Other specified abnormal immunological findings in serum: Secondary | ICD-10-CM

## 2021-10-10 DIAGNOSIS — R778 Other specified abnormalities of plasma proteins: Secondary | ICD-10-CM

## 2021-10-11 ENCOUNTER — Inpatient Hospital Stay: Payer: Medicaid Other | Attending: Oncology

## 2021-10-11 DIAGNOSIS — R768 Other specified abnormal immunological findings in serum: Secondary | ICD-10-CM | POA: Insufficient documentation

## 2021-10-11 DIAGNOSIS — Z79899 Other long term (current) drug therapy: Secondary | ICD-10-CM | POA: Diagnosis not present

## 2021-10-11 DIAGNOSIS — R778 Other specified abnormalities of plasma proteins: Secondary | ICD-10-CM

## 2021-10-11 LAB — COMPREHENSIVE METABOLIC PANEL
ALT: 17 U/L (ref 0–44)
AST: 17 U/L (ref 15–41)
Albumin: 3.8 g/dL (ref 3.5–5.0)
Alkaline Phosphatase: 63 U/L (ref 38–126)
Anion gap: 8 (ref 5–15)
BUN: 7 mg/dL (ref 6–20)
CO2: 25 mmol/L (ref 22–32)
Calcium: 9.3 mg/dL (ref 8.9–10.3)
Chloride: 104 mmol/L (ref 98–111)
Creatinine, Ser: 0.6 mg/dL (ref 0.44–1.00)
GFR, Estimated: >60 mL/min (ref >60)
Glucose, Bld: 84 mg/dL (ref 70–99)
Potassium: 3.9 mmol/L (ref 3.5–5.1)
Sodium: 137 mmol/L (ref 135–145)
Total Bilirubin: 0.5 mg/dL (ref 0.3–1.2)
Total Protein: 7.6 g/dL (ref 6.5–8.1)

## 2021-10-11 LAB — CBC WITH DIFFERENTIAL/PLATELET
Abs Immature Granulocytes: 0.02 10*3/uL (ref 0.00–0.07)
Basophils Absolute: 0 10*3/uL (ref 0.0–0.1)
Basophils Relative: 0 %
Eosinophils Absolute: 0 10*3/uL (ref 0.0–0.5)
Eosinophils Relative: 0 %
HCT: 40.4 % (ref 36.0–46.0)
Hemoglobin: 13.6 g/dL (ref 12.0–15.0)
Immature Granulocytes: 0 %
Lymphocytes Relative: 20 %
Lymphs Abs: 1.4 10*3/uL (ref 0.7–4.0)
MCH: 30 pg (ref 26.0–34.0)
MCHC: 33.7 g/dL (ref 30.0–36.0)
MCV: 89.2 fL (ref 80.0–100.0)
Monocytes Absolute: 0.4 10*3/uL (ref 0.1–1.0)
Monocytes Relative: 6 %
Neutro Abs: 5.4 10*3/uL (ref 1.7–7.7)
Neutrophils Relative %: 74 %
Platelets: 273 10*3/uL (ref 150–400)
RBC: 4.53 MIL/uL (ref 3.87–5.11)
RDW: 12.1 % (ref 11.5–15.5)
WBC: 7.3 10*3/uL (ref 4.0–10.5)
nRBC: 0 % (ref 0.0–0.2)

## 2021-10-14 LAB — KAPPA/LAMBDA LIGHT CHAINS
Kappa free light chain: 32.2 mg/L — ABNORMAL HIGH (ref 3.3–19.4)
Kappa, lambda light chain ratio: 0.93 (ref 0.26–1.65)
Lambda free light chains: 34.5 mg/L — ABNORMAL HIGH (ref 5.7–26.3)

## 2021-10-15 ENCOUNTER — Encounter: Payer: Self-pay | Admitting: Oncology

## 2021-10-15 ENCOUNTER — Inpatient Hospital Stay (HOSPITAL_BASED_OUTPATIENT_CLINIC_OR_DEPARTMENT_OTHER): Payer: Medicaid Other | Admitting: Oncology

## 2021-10-15 DIAGNOSIS — R768 Other specified abnormal immunological findings in serum: Secondary | ICD-10-CM | POA: Diagnosis not present

## 2021-10-15 NOTE — Progress Notes (Signed)
I connected with Emily Nash on 10/15/21 at  2:15 PM EDT by video enabled telemedicine visit and verified that I am speaking with the correct person using two identifiers.   I discussed the limitations, risks, security and privacy concerns of performing an evaluation and management service by telemedicine and the availability of in-person appointments. I also discussed with the patient that there may be a patient responsible charge related to this service. The patient expressed understanding and agreed to proceed.  Other persons participating in the visit and their role in the encounter:  none  Patient's location:  car Provider's location:  work  Stage manager Complaint: Routine follow-up of elevated free light chains  History of present illness: Patient is a 31 year old female who was seen by Dr. Maggie Schwalbe from rheumatology for symptoms of polyarthralgia.  As a part of the work-up he ordered SPEP which showed mildly elevated IgA of 41.  There was no M spike observed but immunofixation showed polyclonal increase in immunoglobulins.  CMP from 12/11/2019 was normal.  CBC was normal as well with an H&H of 14.1/29.9.   Patient had Covid infection in August 2021.  Since then she has been having symptoms of Back pain and generalized pain in her joints.  Sometimes she has episodes of palpitations.  She was previously on a keto diet and lost about 30 pounds intentionally.   Results of blood work from 03/19/2020 showed a normal CBC with an H&H of 13.9/29.9.  CMP was within normal limits.  Both kappa and lambda free light chains were mildly elevated with a normal free light chain ratio of 0.84.  Random urine protein electrophoresis showed no M protein    Interval history Patient has ongoing fatigue she recently tested positive on her pregnancy test and is due in January 2024.   Review of Systems  Constitutional:  Positive for malaise/fatigue. Negative for chills, fever and weight loss.  HENT:  Negative for  congestion, ear discharge and nosebleeds.   Eyes:  Negative for blurred vision.  Respiratory:  Negative for cough, hemoptysis, sputum production, shortness of breath and wheezing.   Cardiovascular:  Negative for chest pain, palpitations, orthopnea and claudication.  Gastrointestinal:  Negative for abdominal pain, blood in stool, constipation, diarrhea, heartburn, melena, nausea and vomiting.  Genitourinary:  Negative for dysuria, flank pain, frequency, hematuria and urgency.  Musculoskeletal:  Negative for back pain, joint pain and myalgias.  Skin:  Negative for rash.  Neurological:  Negative for dizziness, tingling, focal weakness, seizures, weakness and headaches.  Endo/Heme/Allergies:  Does not bruise/bleed easily.  Psychiatric/Behavioral:  Negative for depression and suicidal ideas. The patient does not have insomnia.     Allergies  Allergen Reactions   Latex Swelling and Rash    Past Medical History:  Diagnosis Date   Anxiety    GERD (gastroesophageal reflux disease)    occ-tums prn   Ovarian cyst    Tachycardia    PT STATES THAT OCC SHE WILL FEEL HER HR GET REALLY FAST AND FEELS LIKE SHE CANT GET HER BREATH-ALL OF THIS RESOLVES WITHIN A COUPLE OF SECONDS   Tobacco use    quit 2020    Past Surgical History:  Procedure Laterality Date   CESAREAN SECTION     CHOLECYSTECTOMY     COLONOSCOPY WITH PROPOFOL N/A 05/10/2020   Procedure: COLONOSCOPY WITH PROPOFOL;  Surgeon: Pasty Spillers, MD;  Location: [facility] END[facility]COPY;  Service: Endoscopy;  Laterality: N/A;   ESOPHAGOGASTRODUODENOSCOPY (EGD) WITH PROPOFOL N/A 10/28/2019   Procedure:  ESOPHAGOGASTRODUODENOSCOPY (EGD) WITH PROPOFOL;  Surgeon: [name], MD;  Location: [facility] ENDOSCOPY;  Service: Endoscopy;  Laterality: N/A;   ESOPHAGOGASTRODUODENOSCOPY (EGD) WITH PROPOFOL N/A 05/10/2020   Procedure: ESOPHAGOGASTRODUODENOSCOPY (EGD) WITH PROPOFOL;  Surgeon: Pasty Spillers, MD;  Location: [facility] ENDOSCOPY;  Service:  Endoscopy;  Laterality: N/A;   gallbladder remove     INDUCED ABORTION     LEEP N/A 07/28/2017   Procedure: LOOP ELECTROSURGICAL EXCISION PROCEDURE (LEEP);  Surgeon: [name], MD;  Location: [facility] ORS;  Service: Gynecology;  Laterality: N/A;    Social History   Socioeconomic History   Marital status: Significant Other    Spouse name: Not on file   Number of children: 2   Years of education: 12   Highest education level: Not on file  Occupational History   Occupation: stay at home mom  Tobacco Use   Smoking status: Former    Packs/day: 0.50    Years: 11.00    Total pack years: 5.50    Types: Cigarettes    Quit date: 05/22/2017    Years since quitting: 4.4   Smokeless tobacco: Never  Vaping Use   Vaping Use: Former  Substance and Sexual Activity   Alcohol use: No   Drug use: Not Currently   Sexual activity: Yes    Birth control/protection: None  Other Topics Concern   Not on file  Social History Narrative   Not on file   Social Determinants of Health   Financial Resource Strain: Low Risk  (09/09/2021)   Overall Financial Resource Strain (CARDIA)    Difficulty of Paying Living Expenses: Not hard at all  Food Insecurity: No Food Insecurity (09/09/2021)   Hunger Vital Sign    Worried About Running Out of Food in the Last Year: Never true    Ran Out of Food in the Last Year: Never true  Transportation Needs: No Transportation Needs (09/09/2021)   PRAPARE - Administrator, Civil Service (Medical): No    Lack of Transportation (Non-Medical): No  Physical Activity: Inactive (09/09/2021)   Exercise Vital Sign    Days of Exercise per Week: 0 days    Minutes of Exercise per Session: 0 min  Stress: No Stress Concern Present (09/09/2021)   [employer] of Occupational Health - Occupational Stress Questionnaire    Feeling of Stress : Not at all  Social Connections: Unknown (09/09/2021)   Social Connection and Isolation Panel [NHANES]    Frequency of  Communication with Friends and Family: More than three times a week    Frequency of Social Gatherings with Friends and Family: More than three times a week    Attends Religious Services: Not on Insurance claims handler of Clubs or Organizations: No    Attends Banker Meetings: Never    Marital Status: Living with partner  Intimate Partner Violence: Not At Risk (09/09/2021)   Humiliation, Afraid, Rape, and Kick questionnaire    Fear of Current or Ex-Partner: No    Emotionally Abused: No    Physically Abused: No    Sexually Abused: No    Family History  Problem Relation Age of Onset   Lung cancer Paternal Grandmother    Breast cancer Neg Hx    Cancer Neg Hx    Heart failure Neg Hx    Diabetes Neg Hx    Stroke Neg Hx    Hypertension Neg Hx      Current Outpatient Medications:    gabapentin (  NEURONTIN) 300 MG capsule, Take 300 mg by mouth at bedtime., Disp: , Rfl:    ondansetron (ZOFRAN-ODT) 4 MG disintegrating tablet, Take 1 tablet (4 mg total) by mouth every 6 (six) hours as needed for nausea. (Patient not taking: Reported on 10/15/2021), Disp: 120 tablet, Rfl: 3  No results found.  No images are attached to the encounter.      Latest Ref Rng & Units 10/11/2021    1:18 PM  CMP  Glucose 70 - 99 mg/dL 84   BUN 6 - 20 mg/dL 7   Creatinine 5.46 - 2.70 mg/dL 3.50   Sodium 093 - 818 mmol/L 137   Potassium 3.5 - 5.1 mmol/L 3.9   Chloride 98 - 111 mmol/L 104   CO2 22 - 32 mmol/L 25   Calcium 8.9 - 10.3 mg/dL 9.3   Total Protein 6.5 - 8.1 g/dL 7.6   Total Bilirubin 0.3 - 1.2 mg/dL 0.5   Alkaline Phos 38 - 126 U/L 63   AST 15 - 41 U/L 17   ALT 0 - 44 U/L 17       Latest Ref Rng & Units 10/11/2021    1:18 PM  CBC  WBC 4.0 - 10.5 K/uL 7.3   Hemoglobin 12.0 - 15.0 g/dL 29.9   Hematocrit 37.1 - 46.0 % 40.4   Platelets 150 - 400 K/uL 273      Observation/objective: Appears in no acute distress over video visit today.  Breathing is nonlabored  Assessment and  plan: Patient is a 31 year old female referred for elevated free light chain  CBC and CMP are currently normal.  Myeloma panel in the past has not shown any M protein.  Polyclonal increase in proteins is nonspecific.  Although both kappa and lambda light chains are mildly elevated the ratio remains normal.  This is not concerning for plasma cell dyscrasia.  As such patient does not require any follow-up with me at this time.  I note that the patient is currently pregnant and if there was any concern for anemia down the line she can be referred to [place] in the future  Follow-up instructions: No follow-up needed  I discussed the assessment and treatment plan with the patient. The patient was provided an opportunity to ask questions and all were answered. The patient agreed with the plan and demonstrated an understanding of the instructions.   The patient was advised to call back or seek an in-person evaluation if the symptoms worsen or if the condition fails to improve as anticipated.  I provided 11 minutes of face-to-face video visit time during this encounter.  Time spent in reviewing prior labs and discussing further management  Visit Diagnosis: 1. Elevated serum immunoglobulin free light chains     Dr. Owens Shark, MD, MPH [facility] at [facility] Tel- [phone] 10/15/2021 1:46 PM

## 2021-10-16 ENCOUNTER — Ambulatory Visit
Admission: RE | Admit: 2021-10-16 | Discharge: 2021-10-16 | Disposition: A | Discharge status: Home / Self Care | Payer: Medicaid Other | Source: Ambulatory Visit | Attending: Obstetrics | Admitting: Obstetrics

## 2021-10-16 ENCOUNTER — Other Ambulatory Visit: Payer: Self-pay | Admitting: Obstetrics

## 2021-10-16 DIAGNOSIS — O0990 Supervision of high risk pregnancy, unspecified, unspecified trimester: Secondary | ICD-10-CM | POA: Diagnosis present

## 2021-10-16 DIAGNOSIS — O99210 Obesity complicating pregnancy, unspecified trimester: Secondary | ICD-10-CM

## 2021-10-16 LAB — MULTIPLE MYELOMA PANEL, SERUM
Albumin SerPl Elph-Mcnc: 3.4 g/dL (ref 2.9–4.4)
Albumin/Glob SerPl: 1 (ref 0.7–1.7)
Alpha 1: 0.3 g/dL (ref 0.0–0.4)
Alpha2 Glob SerPl Elph-Mcnc: 0.8 g/dL (ref 0.4–1.0)
B-Globulin SerPl Elph-Mcnc: 1.2 g/dL (ref 0.7–1.3)
Gamma Glob SerPl Elph-Mcnc: 1.3 g/dL (ref 0.4–1.8)
Globulin, Total: 3.6 g/dL (ref 2.2–3.9)
IgA: 409 mg/dL — ABNORMAL HIGH (ref 87–352)
IgG (Immunoglobin G), Serum: 1170 mg/dL (ref 586–1602)
IgM (Immunoglobulin M), Srm: 74 mg/dL (ref 26–217)
Total Protein ELP: 7 g/dL (ref 6.0–8.5)

## 2021-10-17 ENCOUNTER — Telehealth: Payer: Self-pay

## 2021-10-17 ENCOUNTER — Other Ambulatory Visit: Payer: Self-pay | Admitting: Physician Assistant

## 2021-10-17 DIAGNOSIS — R591 Generalized enlarged lymph nodes: Secondary | ICD-10-CM

## 2021-10-17 NOTE — Telephone Encounter (Signed)
[facility] radiology contacted office in regards to [facility] [place] that was done on 10/16/21. He request that provider reviews. [place] was ordered by Paula Compton who is not in office, will forward to provider on call to review report and advise. KW

## 2021-10-18 ENCOUNTER — Encounter: Payer: Self-pay | Admitting: Licensed Practical Nurse

## 2021-10-18 ENCOUNTER — Other Ambulatory Visit: Payer: Self-pay | Admitting: Licensed Practical Nurse

## 2021-10-18 ENCOUNTER — Telehealth: Payer: Self-pay

## 2021-10-18 DIAGNOSIS — O021 Missed abortion: Secondary | ICD-10-CM

## 2021-10-18 MED ORDER — OXYCODONE-ACETAMINOPHEN 5-325 MG PO TABS: 1.0000 | ORAL_TABLET | Freq: Four times a day (QID) | ORAL | 0 refills | Status: DC | PRN

## 2021-10-18 MED ORDER — OXYCODONE-ACETAMINOPHEN 2.5-325 MG PO TABS: 2.0000 | ORAL_TABLET | Freq: Four times a day (QID) | ORAL | 0 refills | Status: DC | PRN

## 2021-10-18 MED ORDER — MISOPROSTOL 200 MCG PO TABS: ORAL_TABLET | ORAL | 1 refills | Status: DC

## 2021-10-18 NOTE — Progress Notes (Signed)
Telephone call: Pt had recent US that showed failed pregnancy at 9wks2days. Pt contacted office to see what to do. She spoke with Shanda Bumps and then myself.   Condolences for loss were offered.  Reviewed options 1) wait and see x 2 weeks 2) CYtotec  Taking cytotec at home can be associated with uterine bleeding and cramping which the patient will need to be prepared to have within 1-4 hours of taking the medication. A support person should be present in case of an emergency. This is an affordable and safe alternative to a dilation and evacuation if precautions are taken.  A patient will need to follow up 1-4 weeks after taking cytotec to make sure that the pregnancy has completely passed. 3) D and C  Having a dilation and evacuation of the pregnancy will result in a nearly 100% removal of the tissue.  Risks and benefits of each option discussed. Patient elected for Cytotec.   Script sent to local pharmacy, may repeat in 12-24 if no bleeding after first dose Percocet script sent as pt cannot take NSAID Cytotec handout sent to pt via mychart Bleeding precautions reviewed.     Plan to follow up in 1-4 weeks.,  Carie Caddy, CNM  [facility] Pulse, [facility]  10/18/21  12:49 PM

## 2021-10-18 NOTE — Telephone Encounter (Signed)
Pt called has questions following up on her u/s. Pt spoke with Isabelle Course decided to do Cytotec.

## 2021-10-21 ENCOUNTER — Ambulatory Visit: Payer: Medicaid Other | Admitting: Internal Medicine

## 2021-10-21 NOTE — Progress Notes (Signed)
PT did not show for scheduled appointment.  

## 2021-10-23 ENCOUNTER — Ambulatory Visit
Admission: RE | Admit: 2021-10-23 | Discharge: 2021-10-23 | Disposition: A | Discharge status: Home / Self Care | Payer: Medicaid Other | Source: Ambulatory Visit | Attending: Physician Assistant | Admitting: Physician Assistant

## 2021-10-23 DIAGNOSIS — R591 Generalized enlarged lymph nodes: Secondary | ICD-10-CM | POA: Insufficient documentation

## 2021-10-25 ENCOUNTER — Telehealth: Payer: Self-pay | Admitting: Licensed Practical Nurse

## 2021-10-25 DIAGNOSIS — O021 Missed abortion: Secondary | ICD-10-CM

## 2021-10-25 NOTE — Telephone Encounter (Signed)
TC to fu on MAB Pt was to take cytotec on 7/28, then fu in the clinic.  Pt did not keep apt.  Pt reports she did not take the Cytotec.  She has been spotting since her [place], and 2 days ago she experienced a gush of  blood and passed a few clots. Now she is bleeding like a period and has some cramping.  Reviewed we need to follow quants and may need an [place].  Will get blood drawn at [facility] tomorrow.  Will make plan based off of bleeding and labs. Order placed. Carie Caddy, CNM  [facility] Pulse, [facility]  @TODAY @  4:13 PM

## 2021-10-29 ENCOUNTER — Other Ambulatory Visit: Payer: Medicaid Other

## 2021-10-30 ENCOUNTER — Other Ambulatory Visit: Payer: Self-pay | Admitting: Licensed Practical Nurse

## 2021-10-30 DIAGNOSIS — O039 Complete or unspecified spontaneous abortion without complication: Secondary | ICD-10-CM

## 2021-10-30 LAB — BETA HCG QUANT (REF LAB): hCG Quant: 259 m[IU]/mL

## 2021-10-30 NOTE — Progress Notes (Signed)
TC to pt Reviewed beta 259. Looks like she most likely passed everything. Pt reports she is bleeding lightly now.  Wonders if she needs to keep tomorrow's apt, If you have any concerns or would like to discuss birth control you should keep the apt. Pt expresses desires for a tubal. Will cancel tomorrow's apt Will schedule apt w/ Dr Logan Bores to discuss BTL This cnm with remain in contact follow beta levels. Ordered placed, will go in about 1 week for repeat beta Carie Caddy, CNM  [facility]ulse, [facility]  10/30/21  1:43 PM

## 2021-10-31 ENCOUNTER — Encounter: Payer: Medicaid Other | Admitting: Licensed Practical Nurse

## 2021-11-12 ENCOUNTER — Encounter: Payer: Self-pay | Admitting: Licensed Practical Nurse

## 2021-11-18 ENCOUNTER — Other Ambulatory Visit: Payer: Self-pay | Admitting: Licensed Practical Nurse

## 2021-11-18 ENCOUNTER — Other Ambulatory Visit: Payer: Medicaid Other

## 2021-11-18 DIAGNOSIS — O039 Complete or unspecified spontaneous abortion without complication: Secondary | ICD-10-CM

## 2021-11-19 LAB — BETA HCG QUANT (REF LAB): hCG Quant: 109 m[IU]/mL

## 2021-11-21 ENCOUNTER — Other Ambulatory Visit: Payer: Self-pay | Admitting: Licensed Practical Nurse

## 2021-11-21 ENCOUNTER — Encounter: Payer: Self-pay | Admitting: Licensed Practical Nurse

## 2021-11-21 DIAGNOSIS — O039 Complete or unspecified spontaneous abortion without complication: Secondary | ICD-10-CM

## 2021-11-27 ENCOUNTER — Other Ambulatory Visit: Payer: Self-pay | Admitting: Licensed Practical Nurse

## 2021-11-27 ENCOUNTER — Other Ambulatory Visit: Payer: Medicaid Other

## 2021-11-27 DIAGNOSIS — O039 Complete or unspecified spontaneous abortion without complication: Secondary | ICD-10-CM

## 2021-11-28 LAB — BETA HCG QUANT (REF LAB): hCG Quant: 55 m[IU]/mL

## 2021-11-29 ENCOUNTER — Encounter: Payer: Self-pay | Admitting: Licensed Practical Nurse

## 2021-11-29 ENCOUNTER — Other Ambulatory Visit: Payer: Self-pay | Admitting: Licensed Practical Nurse

## 2021-11-29 DIAGNOSIS — O039 Complete or unspecified spontaneous abortion without complication: Secondary | ICD-10-CM

## 2021-11-29 NOTE — Progress Notes (Signed)
Beta 55. Mychart message sent to repeat lab in 2 weeks. Will follow until it is less than 5 Carie Caddy, CNM  [facility], [facility]  11/29/21  10:00 AM

## 2021-12-11 ENCOUNTER — Other Ambulatory Visit: Payer: Self-pay | Admitting: Licensed Practical Nurse

## 2021-12-11 DIAGNOSIS — O039 Complete or unspecified spontaneous abortion without complication: Secondary | ICD-10-CM

## 2021-12-12 ENCOUNTER — Encounter: Payer: Medicaid Other | Admitting: Obstetrics and Gynecology

## 2021-12-12 ENCOUNTER — Other Ambulatory Visit: Payer: Medicaid Other

## 2021-12-12 DIAGNOSIS — O039 Complete or unspecified spontaneous abortion without complication: Secondary | ICD-10-CM

## 2021-12-12 DIAGNOSIS — Z3009 Encounter for other general counseling and advice on contraception: Secondary | ICD-10-CM

## 2021-12-13 LAB — BETA HCG QUANT (REF LAB): hCG Quant: 8 m[IU]/mL

## 2021-12-16 NOTE — Telephone Encounter (Signed)
Called pt; 9/21 Beta HCG was 8; last week she started bleeding; is having all day horrible cramps; bleeding is bright, it's slimy and smelly; it is time for her period; miscarriage was the end of June; doesn't sound like she is bleeding too heavy; uses the period panties and is not having to change them every 25min-1hr; also, wants to know if this is too long for her to be bleeding?

## 2021-12-18 ENCOUNTER — Encounter: Payer: Self-pay | Admitting: Intensive Care

## 2021-12-18 ENCOUNTER — Emergency Department
Admission: EM | Admit: 2021-12-18 | Discharge: 2021-12-18 | Disposition: A | Discharge status: Home / Self Care | Payer: Medicaid Other | Attending: Emergency Medicine | Admitting: Emergency Medicine

## 2021-12-18 ENCOUNTER — Emergency Department: Payer: Medicaid Other

## 2021-12-18 ENCOUNTER — Other Ambulatory Visit: Payer: Medicaid Other

## 2021-12-18 ENCOUNTER — Other Ambulatory Visit: Payer: Self-pay

## 2021-12-18 DIAGNOSIS — N9489 Other specified conditions associated with female genital organs and menstrual cycle: Secondary | ICD-10-CM | POA: Diagnosis not present

## 2021-12-18 DIAGNOSIS — R102 Pelvic and perineal pain: Secondary | ICD-10-CM | POA: Insufficient documentation

## 2021-12-18 DIAGNOSIS — Z8759 Personal history of other complications of pregnancy, childbirth and the puerperium: Secondary | ICD-10-CM

## 2021-12-18 DIAGNOSIS — N938 Other specified abnormal uterine and vaginal bleeding: Secondary | ICD-10-CM | POA: Insufficient documentation

## 2021-12-18 DIAGNOSIS — N939 Abnormal uterine and vaginal bleeding, unspecified: Secondary | ICD-10-CM

## 2021-12-18 LAB — COMPREHENSIVE METABOLIC PANEL
ALT: 25 U/L (ref 0–44)
AST: 23 U/L (ref 15–41)
Albumin: 4.2 g/dL (ref 3.5–5.0)
Alkaline Phosphatase: 74 U/L (ref 38–126)
Anion gap: 9 (ref 5–15)
BUN: 16 mg/dL (ref 6–20)
CO2: 24 mmol/L (ref 22–32)
Calcium: 8.9 mg/dL (ref 8.9–10.3)
Chloride: 106 mmol/L (ref 98–111)
Creatinine, Ser: 0.72 mg/dL (ref 0.44–1.00)
GFR, Estimated: >60 mL/min (ref >60)
Glucose, Bld: 94 mg/dL (ref 70–99)
Potassium: 4.1 mmol/L (ref 3.5–5.1)
Sodium: 139 mmol/L (ref 135–145)
Total Bilirubin: 0.6 mg/dL (ref 0.3–1.2)
Total Protein: 8.2 g/dL — ABNORMAL HIGH (ref 6.5–8.1)

## 2021-12-18 LAB — CBC
HCT: 43 % (ref 36.0–46.0)
Hemoglobin: 14.2 g/dL (ref 12.0–15.0)
MCH: 29.2 pg (ref 26.0–34.0)
MCHC: 33 g/dL (ref 30.0–36.0)
MCV: 88.5 fL (ref 80.0–100.0)
Platelets: 327 10*3/uL (ref 150–400)
RBC: 4.86 MIL/uL (ref 3.87–5.11)
RDW: 11.9 % (ref 11.5–15.5)
WBC: 8 10*3/uL (ref 4.0–10.5)
nRBC: 0 % (ref 0.0–0.2)

## 2021-12-18 LAB — URINALYSIS, ROUTINE W REFLEX MICROSCOPIC
Bacteria, UA: NONE SEEN
Bilirubin Urine: NEGATIVE
Glucose, UA: NEGATIVE mg/dL
Ketones, ur: NEGATIVE mg/dL
Nitrite: NEGATIVE
Protein, ur: NEGATIVE mg/dL
Specific Gravity, Urine: 1.019 (ref 1.005–1.030)
pH: 5 (ref 5.0–8.0)

## 2021-12-18 LAB — HCG, QUANTITATIVE, PREGNANCY: hCG, Beta Chain, Quant, S: 1 m[IU]/mL (ref <5)

## 2021-12-18 LAB — LIPASE, BLOOD: Lipase: 38 U/L (ref 11–51)

## 2021-12-18 NOTE — Discharge Instructions (Addendum)
Your test today are reassuring.  Your hCG level is 1, and your ultrasound does not show any acute issues but does show thickening of the inner surface of the uterus.  Please follow-up with your gynecologist for further evaluation of these findings.

## 2021-12-18 NOTE — Telephone Encounter (Signed)
Pt called triage stating she is still bleeding and having severe pelvic pain (a 9) with a "rotten vaginal odor". She denies fever. Per JEG, pt needs to go to ER. Pt aware.

## 2021-12-18 NOTE — ED Provider Notes (Signed)
[facility] Provider Note    Event Date/Time   First MD Initiated Contact with Patient 12/18/21 1029     (approximate)   History   Chief Complaint: Miscarriage   HPI  Emily Nash is a 31 y.o. female with a history of cervical dysplasia, obesity, anxiety, GERD, ovarian cyst who comes the ED complaining of pelvic discomfort and vaginal bleeding for the past week.  She has been monitored by gynecology due to persistent elevated hCG level after having a miscarriage 2 months ago.  Denies fever chills chest pain shortness of breath or escalating pain.  No nausea or vomiting.     Physical Exam   Triage Vital Signs: ED Triage Vitals  Enc Vitals Group     BP 12/18/21 1003 124/82     Pulse Rate 12/18/21 1003 87     Resp 12/18/21 1003 18     Temp 12/18/21 1003 98.2 F (36.8 C)     Temp Source 12/18/21 1003 Oral     SpO2 12/18/21 1003 99 %     Weight 12/18/21 1003 220 lb (99.8 kg)     Height 12/18/21 1003 5\' 2"  (1.575 m)     Head Circumference --      Peak Flow --      Pain Score 12/18/21 1014 9     Pain Loc --      Pain Edu? --      Excl. in [place]? --     Most recent vital signs: Vitals:   12/18/21 1003  BP: 124/82  Pulse: 87  Resp: 18  Temp: 98.2 F (36.8 C)  SpO2: 99%    General: Awake, no distress.  CV:  Good peripheral perfusion.  Regular rate rhythm Gafoor extremities Resp:  Normal effort.  Abd:  No distention.  Soft, no focal tenderness Other:  No rash.  Moist mucous membranes.   ED Results / Procedures / Treatments   Labs (all labs ordered are listed, but only abnormal results are displayed) Labs Reviewed  COMPREHENSIVE METABOLIC PANEL - Abnormal; Notable for the following components:      Result Value   Total Protein 8.2 (*)    All other components within normal limits  URINALYSIS, ROUTINE W REFLEX MICROSCOPIC - Abnormal; Notable for the following components:   Color, Urine YELLOW (*)    APPearance HAZY (*)    Hgb urine  dipstick LARGE (*)    Leukocytes,Ua SMALL (*)    All other components within normal limits  LIPASE, BLOOD  CBC  HCG, QUANTITATIVE, PREGNANCY  POC URINE PREG, ED     EKG    RADIOLOGY Ultrasound pelvis interpreted by me, negative for adnexal or uterine mass.  Radiology report reviewed.   PROCEDURES:  Procedures   MEDICATIONS ORDERED IN ED: Medications - No data to display   IMPRESSION / MDM / [place] / ED COURSE  I reviewed the triage vital signs and the nursing notes.                              Differential diagnosis includes, but is not limited to, retained products of conception, ovarian cyst, fibroid, dysmenorrhea, anemia  Patient's presentation is most consistent with acute presentation with potential threat to life or bodily function.  Patient presents with vaginal bleeding for the past week with some pelvic pain.  She is nontoxic, vital signs are normal, exam is overall nonfocal and nonsurgical.  Labs are reassuring as is ultrasound.  hCG serum level is 1, not consistent with pregnancy.  Ultrasound shows thickening of the endometrium which is nonspecific, no other acute findings.  Will refer back to gynecology.       FINAL CLINICAL IMPRESSION(S) / ED DIAGNOSES   Final diagnoses:  Vaginal bleeding     Rx / DC Orders   ED Discharge Orders     None        Note:  This document was prepared using Dragon voice recognition software and may include unintentional dictation errors.   Carrie Mew, MD 12/18/21 1217

## 2021-12-18 NOTE — ED Triage Notes (Signed)
Patient presents with known miscarriage in July 2023. Has been seeing her OBGYN- [facility] and having HCGs done. Last one on 9/21 showed HCG 8. Patient started having abdominal pain and bleeding yesterday. Sent by OBGYN to make sure she does not have tissue still present in uterus.

## 2021-12-30 ENCOUNTER — Ambulatory Visit (INDEPENDENT_AMBULATORY_CARE_PROVIDER_SITE_OTHER): Payer: Medicaid Other | Admitting: Internal Medicine

## 2021-12-30 VITALS — BP 119/80 | HR 82 | Resp 16 | Ht 62.0 in | Wt 238.0 lb

## 2021-12-30 DIAGNOSIS — G4733 Obstructive sleep apnea (adult) (pediatric): Secondary | ICD-10-CM | POA: Insufficient documentation

## 2021-12-30 DIAGNOSIS — E611 Iron deficiency: Secondary | ICD-10-CM | POA: Diagnosis not present

## 2021-12-30 HISTORY — DX: Morbid (severe) obesity due to excess calories: E66.01

## 2021-12-30 NOTE — Progress Notes (Signed)
Sleep Medicine   Office Visit  Patient Name: Emily Nash DOB: 05-Aug-1990 MRN [id number]    Chief Complaint: sleep evaluation  Brief History:  Emily Nash presents with an initial evaluation for possible sleep apnea.    Sleep quality is poor without gabapentin. Patient has no problems falling asleep. She had difficulty with frequent awakenings. With the gabapentin she sleeps well. This is noted most nights. The patient's bed partner reports jerks when first falling asleep at night. The patient relates the following symptoms: frequent awakenings, shaking when waking, excessive daytime sleepiness and occasional snoring are also present. She can sleep through the night only when she takes gabapentin. She started a keto weight loss plan and quit smoking, after she started having her sleeping issues.  The patient goes to sleep at 9-10pm and wakes up at 6am.   Sleep quality is same when outside home environment.  Patient has noted restlessness of her legs at night.  The patient  relates twitching behavior during the night.  The patient reports a history of psychiatric problems (anxiety)  The Epworth Sleepiness Score is 4 out of 24 .    ROS  General: (-) fever, (-) chills, (-) night sweat Nose and Sinuses: (-) nasal stuffiness or itchiness, (-) postnasal drip, (-) nosebleeds, (-) sinus trouble. Mouth and Throat: (-) sore throat, (-) hoarseness. Neck: (-) swollen glands, (-) enlarged thyroid, (-) neck pain. Respiratory: - cough, - shortness of breath, - wheezing. Neurologic: - numbness, - tingling. Psychiatric: + anxiety, - depression Sleep behavior: -sleep paralysis -hypnogogic hallucinations -dream enactment      -vivid dreams -cataplexy -night terrors -sleep walking   Current Medication: Outpatient Encounter Medications as of 12/30/2021  Medication Sig   gabapentin (NEURONTIN) 300 MG capsule Take 300 mg by mouth at bedtime.   misoprostol (CYTOTEC) 200 MCG tablet insert four tablets vaginally, you  may repeat the dose in 12 to 24 hours if bleeding does not occur.   ondansetron (ZOFRAN-ODT) 4 MG disintegrating tablet Take 1 tablet (4 mg total) by mouth every 6 (six) hours as needed for nausea. (Patient not taking: Reported on 10/15/2021)   oxyCODONE-acetaminophen (PERCOCET) 5-325 MG tablet Take 1-2 tablets by mouth every 6 (six) hours as needed for severe pain.   No facility-administered encounter medications on file as of 12/30/2021.    Surgical History: Past Surgical History:  Procedure Laterality Date   CESAREAN SECTION     CHOLECYSTECTOMY     COLONOSCOPY WITH PROPOFOL N/A 05/10/2020   Procedure: COLONOSCOPY WITH PROPOFOL;  Surgeon: Virgel Manifold, MD;  Location: [facility];  Service: Endoscopy;  Laterality: N/A;   ESOPHAGOGASTRODUODENOSCOPY (EGD) WITH PROPOFOL N/A 10/28/2019   Procedure: ESOPHAGOGASTRODUODENOSCOPY (EGD) WITH PROPOFOL;  Surgeon: Lesly Rubenstein, MD;  Location: [facility] ENDOSCOPY;  Service: Endoscopy;  Laterality: N/A;   ESOPHAGOGASTRODUODENOSCOPY (EGD) WITH PROPOFOL N/A 05/10/2020   Procedure: ESOPHAGOGASTRODUODENOSCOPY (EGD) WITH PROPOFOL;  Surgeon: Virgel Manifold, MD;  Location: [facility] ENDOSCOPY;  Service: Endoscopy;  Laterality: N/A;   gallbladder remove     INDUCED ABORTION     LEEP N/A 07/28/2017   Procedure: LOOP ELECTROSURGICAL EXCISION PROCEDURE (LEEP);  Surgeon: Homero Fellers, MD;  Location: [facility] ORS;  Service: Gynecology;  Laterality: N/A;    Medical History: Past Medical History:  Diagnosis Date   Anxiety    GERD (gastroesophageal reflux disease)    occ-tums prn   Ovarian cyst    Tachycardia    PT STATES THAT OCC SHE WILL FEEL HER HR GET REALLY FAST AND  FEELS LIKE SHE CANT GET HER BREATH-ALL OF THIS RESOLVES WITHIN A COUPLE OF SECONDS   Tobacco use    quit 2020    Family History: Non contributory to the present illness  Social History: Social History   Socioeconomic History   Marital status: Significant Other    Spouse  name: Not on file   Number of children: 2   Years of education: 12   Highest education level: Not on file  Occupational History   Occupation: stay at home mom  Tobacco Use   Smoking status: Former    Packs/day: 0.50    Years: 11.00    Total pack years: 5.50    Types: Cigarettes    Quit date: 05/22/2017    Years since quitting: 4.6   Smokeless tobacco: Never  Vaping Use   Vaping Use: Former  Substance and Sexual Activity   Alcohol use: No   Drug use: Not Currently   Sexual activity: Yes    Birth control/protection: None  Other Topics Concern   Not on file  Social History Narrative   Not on file   Social Determinants of Health   Financial Resource Strain: Low Risk  (09/09/2021)   Overall Financial Resource Strain (CARDIA)    Difficulty of Paying Living Expenses: Not hard at all  Food Insecurity: No Food Insecurity (09/09/2021)   Hunger Vital Sign    Worried About Running Out of Food in the Last Year: Never true    Ran Out of Food in the Last Year: Never true  Transportation Needs: No Transportation Needs (09/09/2021)   PRAPARE - Hydrologist (Medical): No    Lack of Transportation (Non-Medical): No  Physical Activity: Inactive (09/09/2021)   Exercise Vital Sign    Days of Exercise per Week: 0 days    Minutes of Exercise per Session: 0 min  Stress: No Stress Concern Present (09/09/2021)   Justice    Feeling of Stress : Not at all  Social Connections: Unknown (09/09/2021)   Social Connection and Isolation Panel [NHANES]    Frequency of Communication with Friends and Family: More than three times a week    Frequency of Social Gatherings with Friends and Family: More than three times a week    Attends Religious Services: Not on file    Active Member of Clubs or Organizations: No    Attends Archivist Meetings: Never    Marital Status: Living with partner  Intimate  Partner Violence: Not At Risk (09/09/2021)   Humiliation, Afraid, Rape, and Kick questionnaire    Fear of Current or Ex-Partner: No    Emotionally Abused: No    Physically Abused: No    Sexually Abused: No    Vital Signs: There were no vitals taken for this visit. There is no height or weight on file to calculate BMI.   Examination: General Appearance: The patient is well-developed, well-nourished, and in no distress. Neck Circumference: 40 cm Skin: Gross inspection of skin unremarkable. Head: normocephalic, no gross deformities. Eyes: no gross deformities noted. ENT: ears appear grossly normal Neurologic: Alert and oriented. No involuntary movements.    STOP BANG RISK ASSESSMENT S (snore) Have you been told that you snore?     YES   T (tired) Are you often tired, fatigued, or sleepy during the day?   YES  O (obstruction) Do you stop breathing, choke, or gasp during sleep? NO   P (  pressure) Do you have or are you being treated for high blood pressure? NO   B (BMI) Is your body index greater than 35 kg/m? YES   A (age) Are you 2 years old or older? NO   N (neck) Do you have a neck circumference greater than 16 inches?   YES   G (gender) Are you a female? NO   TOTAL STOP/BANG "YES" ANSWERS 4                                                               A STOP-Bang score of 2 or less is considered low risk, and a score of 5 or more is high risk for having either moderate or severe OSA. For people who score 3 or 4, doctors may need to perform further assessment to determine how likely they are to have OSA.         EPWORTH SLEEPINESS SCALE:  Scale:  (0)= no chance of dozing; (1)= slight chance of dozing; (2)= moderate chance of dozing; (3)= high chance of dozing  Chance  Situtation    Sitting and reading: 1    Watching TV: 1    Sitting Inactive in public: 0    As a passenger in car: 0      Lying down to rest: 1    Sitting and talking: 0    Sitting quielty  after lunch: 1    In a car, stopped in traffic: 0   TOTAL SCORE:   4 out of 24    SLEEP STUDIES:  None on file   LABS: Recent Results (from the past 2160 hour(s))  Urine Culture     Status: None   Collection Time: 10/03/21  2:51 PM   Specimen: Urine   UR  Result Value Ref Range   Urine Culture, Routine Final report    Organism ID, Bacteria Lactobacillus species     Comment: 50,000-100,000 colony forming units per mL Susceptibility not normally performed on this organism.   Cervicovaginal ancillary only     Status: None   Collection Time: 10/03/21  2:51 PM  Result Value Ref Range   Neisseria Gonorrhea Negative    Chlamydia Negative    Trichomonas Negative    Bacterial Vaginitis (gardnerella) Negative    Candida Vaginitis Negative    Candida Glabrata Negative    Comment      Normal Reference Range Bacterial Vaginosis - Negative   Comment Normal Reference Range Candida Species - Negative    Comment Normal Reference Range Candida Galbrata - Negative    Comment Normal Reference Range Trichomonas - Negative    Comment Normal Reference [place] Chlamydia - Negative    Comment      Normal Reference Range Neisseria Gonorrhea - Negative  Multiple Myeloma Panel (SPEP&IFE w/QIG)     Status: Abnormal   Collection Time: 10/11/21  1:18 PM  Result Value Ref Range   IgG (Immunoglobin G), Serum 1,170 586 - 1,602 mg/dL   IgA 409 (H) 87 - 352 mg/dL   IgM (Immunoglobulin M), Srm 74 26 - 217 mg/dL   Total Protein ELP 7.0 6.0 - 8.5 g/dL   Albumin SerPl Elph-Mcnc 3.4 2.9 - 4.4 g/dL   Alpha 1 0.3 0.0 - 0.4 g/dL   Alpha2 Glob  SerPl Elph-Mcnc 0.8 0.4 - 1.0 g/dL   B-Globulin SerPl Elph-Mcnc 1.2 0.7 - 1.3 g/dL   Gamma Glob SerPl Elph-Mcnc 1.3 0.4 - 1.8 g/dL   M Protein SerPl Elph-Mcnc Not Observed Not Observed g/dL   Globulin, Total 3.6 2.2 - 3.9 g/dL   Albumin/Glob SerPl 1.0 0.7 - 1.7   IFE 1 Comment (A)     Comment: Polyclonal increase detected in one or more immunoglobulins.   Please  Note Comment     Comment: (NOTE) Protein electrophoresis scan will follow via computer, mail, or courier delivery. Performed At: [facility] [place], [place] [id number] Rush Farmer MD [id number]   Kappa/lambda light chains     Status: Abnormal   Collection Time: 10/11/21  1:18 PM  Result Value Ref Range   Kappa free light chain 32.2 (H) 3.3 - 19.4 mg/L   Lambda free light chains 34.5 (H) 5.7 - 26.3 mg/L   Kappa, lambda light chain ratio 0.93 0.26 - 1.65    Comment: (NOTE) Performed At: [facility] [place], [place] [id number] Rush Farmer MD [id number]   Comprehensive metabolic panel     Status: None   Collection Time: 10/11/21  1:18 PM  Result Value Ref Range   Sodium 137 135 - 145 mmol/L   Potassium 3.9 3.5 - 5.1 mmol/L   Chloride 104 98 - 111 mmol/L   CO2 25 22 - 32 mmol/L   Glucose, Bld 84 70 - 99 mg/dL    Comment: Glucose reference range applies only to samples taken after fasting for at least 8 hours.   BUN 7 6 - 20 mg/dL   Creatinine, Ser 0.60 0.44 - 1.00 mg/dL   Calcium 9.3 8.9 - 10.3 mg/dL   Total Protein 7.6 6.5 - 8.1 g/dL   Albumin 3.8 3.5 - 5.0 g/dL   AST 17 15 - 41 U/L   ALT 17 0 - 44 U/L   Alkaline Phosphatase 63 38 - 126 U/L   Total Bilirubin 0.5 0.3 - 1.2 mg/dL   GFR, Estimated >60 >60 mL/min    Comment: (NOTE) Calculated using the CKD-EPI Creatinine Equation (2021)    Anion gap 8 5 - 15    Comment: Performed at [facility], [address] [place], [place] [address]  CBC with Differential/Platelet     Status: None   Collection Time: 10/11/21  1:18 PM  Result Value Ref Range   WBC 7.3 4.0 - 10.5 K/uL   RBC 4.53 3.87 - 5.11 MIL/uL   Hemoglobin 13.6 12.0 - 15.0 g/dL   HCT 40.4 36.0 - 46.0 %   MCV 89.2 80.0 - 100.0 fL   MCH 30.0 26.0 - 34.0 pg   MCHC 33.7 30.0 - 36.0 g/dL   RDW 12.1 11.5 - 15.5 %   Platelets 273 150 - 400 K/uL   nRBC 0.0 0.0 - 0.2 %   Neutrophils Relative % 74 %    Neutro Abs 5.4 1.7 - 7.7 K/uL   Lymphocytes Relative 20 %   Lymphs Abs 1.4 0.7 - 4.0 K/uL   Monocytes Relative 6 %   Monocytes Absolute 0.4 0.1 - 1.0 K/uL   Eosinophils Relative 0 %   Eosinophils Absolute 0.0 0.0 - 0.5 K/uL   Basophils Relative 0 %   Basophils Absolute 0.0 0.0 - 0.1 K/uL   Immature Granulocytes 0 %   Abs Immature Granulocytes 0.02 0.00 - 0.07 K/uL    Comment: Performed at [facility], [address] [place], [place] [address]  Beta HCG, Quant     Status: None   Collection Time: 10/29/21  1:52 PM  Result Value Ref Range   hCG Quant 259 mIU/mL    Comment:                      Female (Non-pregnant)    0 -     5                             (Postmenopausal)  0 -     8                      Female (Pregnant)                      Weeks of Gestation                              3                6 -    71                              4               10 -   750                              5              217 -  7138                              6              158 - 31795                              7             [id number] -[id number]                              8            [id number] -[id number]                              2            [id number] -[id number]                             70            [id number] -[id number]                             65            [id number] [id number]                             14  [place] CLIA methodology   Beta HCG, Quant     Status: None   Collection Time: 11/18/21 11:04 AM  Result Value Ref Range   hCG Quant 109 mIU/mL    Comment:                      Female (Non-pregnant)    0 -     5                             (Postmenopausal)  0 -     8                      Female (Pregnant)                      Weeks of  Gestation                              3                6 -    71                              4               10 -   750                              5              217 -  7138                              6              158 - 31795                              7             3697 -[id number]                              8  [id number] -[id number]                              [place] [facility] CLIA methodology   Beta hCG quant (ref lab)     Status: None   Collection Time: 11/27/21 10:57 AM  Result Value Ref Range   hCG Quant 55 mIU/mL    Comment:                      Female (Non-pregnant)    0 -     5                             (Postmenopausal)  0 -     8                      Female (Pregnant)                      Weeks of Gestation                              3                6 -    71                              4               10 -  750                              5              [place] [facility] CLIA methodology   Beta HCG, Quant     Status: None   Collection Time: 12/12/21  8:48 AM  Result Value Ref Range   hCG Quant 8 mIU/mL    Comment:                      Female (Non-pregnant)    0 -     5                             (  Postmenopausal)  0 -     8                      Female (Pregnant)                      Weeks of Gestation                              3                6 -    71                              4               10 -   750                              5              217 -  7138                              6              158 - [place]                              7             3697 -315400                              8            32065 -[id number]                              9            [place]  [place] CLIA methodology   Urinalysis, Routine w reflex microscopic     Status: Abnormal   Collection Time: 12/18/21 10:09 AM  Result Value Ref Range   Color, Urine YELLOW (A) YELLOW   APPearance HAZY (A) CLEAR   Specific Gravity, Urine 1.019 1.005 - 1.030   pH 5.0 5.0 - 8.0   Glucose, UA NEGATIVE NEGATIVE mg/dL   Hgb urine dipstick LARGE (A) NEGATIVE   Bilirubin Urine NEGATIVE NEGATIVE   Ketones, ur NEGATIVE NEGATIVE mg/dL   Protein, ur NEGATIVE NEGATIVE mg/dL   Nitrite NEGATIVE NEGATIVE   Leukocytes,Ua SMALL (A) NEGATIVE   RBC / HPF 6-10 0 - 5 RBC/hpf   WBC, UA 0-5 0 - 5 WBC/hpf   Bacteria, UA NONE SEEN NONE SEEN   Squamous Epithelial / LPF 0-5 0 - 5   Mucus PRESENT     Comment: Performed at [facility], [address]  [place], [place] [address]  hCG, quantitative, pregnancy     Status: None   Collection Time: 12/18/21 10:09 AM  Result Value Ref Range   hCG, Beta Chain, Quant, S 1 <5 mIU/mL    Comment:          GEST. AGE      CONC.  (mIU/mL)   <=1 WEEK        5 - 50     2 WEEKS       50 - 500     3 WEEKS       100 - 10,000     4 WEEKS     1,000 - 30,000     5 WEEKS     3,500 - 115,000   6-8 WEEKS     12,000 - 270,000    12 WEEKS     15,000 - 220,000        FEMALE AND NON-PREGNANT FEMALE:     LESS THAN 5 mIU/mL Performed at [facility], [address] [place], [place] [address]   Lipase, blood     Status: None   Collection Time: 12/18/21 10:16 AM  Result Value Ref Range   Lipase 38 11 - 51 U/L    Comment: Performed at [facility], [address] [place], [place] [address]  Comprehensive metabolic panel     Status: Abnormal   Collection Time: 12/18/21 10:16 AM  Result Value Ref Range   Sodium 139 135 - 145 mmol/L   Potassium 4.1 3.5 - 5.1 mmol/L   Chloride 106 98 - 111 mmol/L   CO2 24 22 - 32 mmol/L   Glucose, Bld 94 70 - 99 mg/dL    Comment: Glucose reference range applies only to samples taken after fasting for at least 8 hours.   BUN 16 6 - 20 mg/dL   Creatinine, Ser 0.72 0.44 - 1.00 mg/dL   Calcium 8.9 8.9 - 10.3 mg/dL   Total Protein 8.2 (H) 6.5 - 8.1 g/dL   Albumin 4.2 3.5 - 5.0 g/dL   AST 23 15 - 41 U/L   ALT 25 0 - 44 U/L   Alkaline Phosphatase 74 38 - 126 U/L  Total Bilirubin 0.6 0.3 - 1.2 mg/dL   GFR, Estimated >60 >60 mL/min    Comment: (NOTE) Calculated using the CKD-EPI Creatinine Equation (2021)    Anion gap 9 5 - 15    Comment: Performed at [facility], [address] [place], [place] [address]  CBC     Status: None   Collection Time: 12/18/21 10:16 AM  Result Value Ref Range   WBC 8.0 4.0 - 10.5 K/uL   RBC 4.86 3.87 - 5.11 MIL/uL   Hemoglobin 14.2 12.0 - 15.0 g/dL   HCT 43.0 36.0 - 46.0 %   MCV 88.5 80.0 - 100.0 fL   MCH 29.2 26.0 -  34.0 pg   MCHC 33.0 30.0 - 36.0 g/dL   RDW 11.9 11.5 - 15.5 %   Platelets 327 150 - 400 K/uL   nRBC 0.0 0.0 - 0.2 %    Comment: Performed at [facility], [address] [place], [place] [address]    Radiology: US PELVIC COMPLETE WITH TRANSVAGINAL  Result Date: 12/18/2021 CLINICAL DATA:  Pelvic pain. EXAM: TRANSABDOMINAL AND TRANSVAGINAL ULTRASOUND OF PELVIS TECHNIQUE: Both transabdominal and transvaginal ultrasound examinations of the pelvis were performed. Transabdominal technique was performed for global imaging of the pelvis including uterus, ovaries, adnexal regions, and pelvic cul-de-sac. It was necessary to proceed with endovaginal exam following the transabdominal exam to visualize the endometrium and ovaries. COMPARISON:  October 16, 2021. FINDINGS: Uterus Measurements: 11.3 x 6.7 x 5.3 cm = volume: 209 mL. No fibroids or other mass visualized. Endometrium Thickness: 22 mm which is abnormally thickened. Heterogeneous in appearance. Right ovary Measurements: 2.7 x 1.8 x 1.8 cm = volume: 4 mL. Normal appearance/no adnexal mass. Left ovary Measurements: 2.6 x 2.8 x 1.7 cm = volume: 7 mL. Normal appearance/no adnexal mass. Other findings No abnormal free fluid. There is noted fluid in the cervix with associated solid structure measuring 1.5 x 1.0 x 0.8 cm. This abnormality does not demonstrate flow on Doppler. IMPRESSION: Endometrial thickness is considered abnormal. Consider follow-up by [place] in 6-8 weeks, during the week immediately following menses (exam timing is critical). Rounded fluid collection seen in the cervix with associated solid nodule measuring 1.5 cm in maximum diameter. A focal endometrial lesion is suspected. Consider sonohysterogram for further evaluation, prior to hysteroscopy or endometrial biopsy. Electronically Signed   By: Marijo Conception M.D.   On: 12/18/2021 11:57    No results found.  US PELVIC COMPLETE WITH TRANSVAGINAL  Result Date: 12/18/2021 CLINICAL DATA:   Pelvic pain. EXAM: TRANSABDOMINAL AND TRANSVAGINAL ULTRASOUND OF PELVIS TECHNIQUE: Both transabdominal and transvaginal ultrasound examinations of the pelvis were performed. Transabdominal technique was performed for global imaging of the pelvis including uterus, ovaries, adnexal regions, and pelvic cul-de-sac. It was necessary to proceed with endovaginal exam following the transabdominal exam to visualize the endometrium and ovaries. COMPARISON:  October 16, 2021. FINDINGS: Uterus Measurements: 11.3 x 6.7 x 5.3 cm = volume: 209 mL. No fibroids or other mass visualized. Endometrium Thickness: 22 mm which is abnormally thickened. Heterogeneous in appearance. Right ovary Measurements: 2.7 x 1.8 x 1.8 cm = volume: 4 mL. Normal appearance/no adnexal mass. Left ovary Measurements: 2.6 x 2.8 x 1.7 cm = volume: 7 mL. Normal appearance/no adnexal mass. Other findings No abnormal free fluid. There is noted fluid in the cervix with associated solid structure measuring 1.5 x 1.0 x 0.8 cm. This abnormality does not demonstrate flow on Doppler. IMPRESSION: Endometrial thickness is considered abnormal. Consider follow-up  by [place] in 6-8 weeks, during the week immediately following menses (exam timing is critical). Rounded fluid collection seen in the cervix with associated solid nodule measuring 1.5 cm in maximum diameter. A focal endometrial lesion is suspected. Consider sonohysterogram for further evaluation, prior to hysteroscopy or endometrial biopsy. Electronically Signed   By: Marijo Conception M.D.   On: 12/18/2021 11:57      Assessment and Plan: Patient Active Problem List   Diagnosis Date Noted   Supervision of other normal pregnancy, antepartum 09/09/2021   Food intolerance 07/05/2021   Abdominal pain    Stomach irritation    Altered bowel habits    Headache disorder 03/02/2020   Low back pain 03/02/2020   Palpitations 02/13/2020   Anxiety, generalized 12/15/2019   Tachycardia 12/12/2019   Fatigue 12/10/2019    Weakness 12/10/2019   Acute chest pain 11/26/2019   Anxiety, mild 11/26/2019   Difficulty sleeping 11/26/2019   Numbness and tingling of both legs 11/26/2019   Right upper quadrant pain 11/01/2019   Weight loss 11/01/2019   Dysplasia of cervix, 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 OSA (obstructive sleep apnea) PLAN OSA:   Patient evaluation suggests high risk of sleep disordered breathing due to frequent awakenings, twitching, daytime somnolence, morbid obesity.   Suggest: PSG to assess the patient's sleep disordered breathing. The patient was also counselled on weight loss to optimize sleep health.   2. Morbid obesity ([place]) Obesity Counseling: Had a lengthy discussion regarding patients BMI and weight issues. Patient was instructed on portion control as well as increased activity. Also discussed caloric restrictions with trying to maintain intake less than 2000 Kcal. Discussions were made in accordance with the 5As of weight management. Simple actions such as not eating late and if able to, taking a walk is suggested.     General Counseling: I have discussed the findings of the evaluation and examination with Emily Nash.  I have also discussed any further diagnostic evaluation thatmay be needed or ordered today. Emily Nash verbalizes understanding of the findings of todays visit. We also reviewed her medications today and discussed drug interactions and side effects including but not limited excessive drowsiness and altered mental states. We also discussed that there is always a risk not just to her but also people around her. she has been encouraged to call the office with any questions or concerns that should arise related to todays visit.  No orders of the defined types were placed in this encounter.       I have personally obtained a history, evaluated the patient, evaluated  pertinent data, formulated the assessment and plan and placed orders.   This patient was seen today by Tressie Ellis, PA-C in collaboration with Dr. Devona Konig.   Allyne Gee, MD [facility] Diplomate ABMS Pulmonary and Critical Care Medicine Sleep medicine

## 2022-01-17 ENCOUNTER — Ambulatory Visit: Payer: Medicaid Other | Admitting: Obstetrics

## 2022-01-17 VITALS — BP 114/77 | HR 91 | Resp 16 | Wt 238.0 lb

## 2022-01-17 DIAGNOSIS — O039 Complete or unspecified spontaneous abortion without complication: Secondary | ICD-10-CM | POA: Insufficient documentation

## 2022-01-17 DIAGNOSIS — Z30011 Encounter for initial prescription of contraceptive pills: Secondary | ICD-10-CM | POA: Diagnosis not present

## 2022-01-17 MED ORDER — NORETHIN ACE-ETH ESTRAD-FE 1-20 MG-MCG PO TABS: 1.0000 | ORAL_TABLET | Freq: Every day | ORAL | 11 refills | Status: DC

## 2022-01-17 NOTE — Progress Notes (Signed)
Obstetrics & Gynecology Office Visit   Chief Complaint:  Chief Complaint  Patient presents with   Follow-up    ER Follow up/miscarriage    History of Present Illness:Emily Nash present s for scheduled follow up after having longterm bleeding after a miscarriage in July. She originally contacted [place], CNM who prescirbed her Cytotec for complete the SAB. Emily Nash did not take this medication. She was folloed for serial quants over several weeks. On 9/27 she contaced the OB office and reported an increase in vaginal bleeding and severa pelvic pain. She went to the [facility] Ed where an ultrasound indicated a thickened endometrial lining. Her quant level at that time was 5.  The plan was for her to f/u at [facility] Today she reports that shortly after leaving the ED, she passed a solid "clot sized "piece of tissue. Her severe cramping immediately ceased. She believes that she passed retained products. She also reports that now she is on her first period since the SAB and subsequent bleeding episode. She denies any pelvic pain, malodorous discharge.  She desires a BTL, and wants to meet with Dr. Logan Bores to discuss a tubal. She is not contracepting, and is open to using OCPs until her sterilization procedure is complete.  She has a Hx of abnormal pap, and LEEP procedure. She shares that having colpo and LEEP have both been extremely anxiety producing for her. She had her LEEP done at [facility] under sedation.   Review of Systems:  Review of Systems  Constitutional: Negative.   HENT: Negative.    Eyes: Negative.   Respiratory: Negative.    Cardiovascular: Negative.   Gastrointestinal: Negative.   Genitourinary: Negative.   Musculoskeletal: Negative.   Skin: Negative.   Neurological: Negative.   Endo/Heme/Allergies: Negative.   Psychiatric/Behavioral: Negative.       Past Medical History:  Past Medical History:  Diagnosis Date   Anxiety    GERD (gastroesophageal reflux disease)    occ-tums prn    Ovarian cyst    Tachycardia    PT STATES THAT OCC SHE WILL FEEL HER HR GET REALLY FAST AND FEELS LIKE SHE CANT GET HER BREATH-ALL OF THIS RESOLVES WITHIN A COUPLE OF SECONDS   Tobacco use    quit 2020    Past Surgical History:  Past Surgical History:  Procedure Laterality Date   CESAREAN SECTION     CHOLECYSTECTOMY     COLONOSCOPY WITH PROPOFOL N/A 05/10/2020   Procedure: COLONOSCOPY WITH PROPOFOL;  Surgeon: Pasty Spillers, MD;  Location: [facility] ENDOSCOPY;  Service: Endoscopy;  Laterality: N/A;   ESOPHAGOGASTRODUODENOSCOPY (EGD) WITH PROPOFOL N/A 10/28/2019   Procedure: ESOPHAGOGASTRODUODENOSCOPY (EGD) WITH PROPOFOL;  Surgeon: [name], MD;  Location: [facility] ENDOSCOPY;  Service: Endoscopy;  Laterality: N/A;   ESOPHAGOGASTRODUODENOSCOPY (EGD) WITH PROPOFOL N/A 05/10/2020   Procedure: ESOPHAGOGASTRODUODENOSCOPY (EGD) WITH PROPOFOL;  Surgeon: Pasty Spillers, MD;  Location: [facility] ENDOSCOPY;  Service: Endoscopy;  Laterality: N/A;   gallbladder remove     INDUCED ABORTION     LEEP N/A 07/28/2017   Procedure: LOOP ELECTROSURGICAL EXCISION PROCEDURE (LEEP);  Surgeon: [name], MD;  Location: [facility] ORS;  Service: Gynecology;  Laterality: N/A;    Gynecologic History: Patient's last menstrual period was 01/16/2022 (exact date).  Obstetric History: [id number]  Family History:  Family History  Problem Relation Age of Onset   Lung cancer Paternal Grandmother    Breast cancer Neg Hx    Cancer Neg Hx    Heart failure Neg Hx  Diabetes Neg Hx    Stroke Neg Hx    Hypertension Neg Hx     Social History:  Social History   Socioeconomic History   Marital status: Significant Other    Spouse name: Not on file   Number of children: 2   Years of education: 12   Highest education level: Not on file  Occupational History   Occupation: stay at home mom  Tobacco Use   Smoking status: Former    Packs/day: 0.50    Years: 11.00    Total pack years: 5.50    Types:  Cigarettes    Quit date: 05/22/2017    Years since quitting: 4.6   Smokeless tobacco: Never  Vaping Use   Vaping Use: Former  Substance and Sexual Activity   Alcohol use: No   Drug use: Not Currently   Sexual activity: Yes    Birth control/protection: None  Other Topics Concern   Not on file  Social History Narrative   Not on file   Social Determinants of Health   Financial Resource Strain: Low Risk  (09/09/2021)   Overall Financial Resource Strain (CARDIA)    Difficulty of Paying Living Expenses: Not hard at all  Food Insecurity: No Food Insecurity (09/09/2021)   Hunger Vital Sign    Worried About Running Out of Food in the Last Year: Never true    [place] in the Last Year: Never true  Transportation Needs: No Transportation Needs (09/09/2021)   PRAPARE - Hydrologist (Medical): No    Lack of Transportation (Non-Medical): No  Physical Activity: Inactive (09/09/2021)   Exercise Vital Sign    Days of Exercise per Week: 0 days    Minutes of Exercise per Session: 0 min  Stress: No Stress Concern Present (09/09/2021)   [place]    Feeling of Stress : Not at all  Social Connections: Unknown (09/09/2021)   Social Connection and Isolation Panel [NHANES]    Frequency of Communication with Friends and Family: More than three times a week    Frequency of Social Gatherings with Friends and Family: More than three times a week    Attends Religious Services: Not on file    Active Member of Clubs or Organizations: No    Attends Archivist Meetings: Never    Marital Status: Living with partner  Intimate Partner Violence: Not At Risk (09/09/2021)   Humiliation, Afraid, Rape, and Kick questionnaire    Fear of Current or Ex-Partner: No    Emotionally Abused: No    Physically Abused: No    Sexually Abused: No    Allergies:  Allergies  Allergen Reactions   Latex Swelling and  Rash    Medications: Prior to Admission medications   Medication Sig Start Date End Date Taking? Authorizing Provider  gabapentin (NEURONTIN) 300 MG capsule Take 300 mg by mouth at bedtime.    [provider]    Physical Exam Vitals:  Vitals:   01/17/22 1058  BP: 114/77  Pulse: 91  Resp: 16  SpO2: 98%   Patient's last menstrual period was 01/16/2022 (exact date).  Physical Exam Vitals reviewed.  Constitutional:      Appearance: Normal appearance. She is obese.  Cardiovascular:     Rate and Rhythm: Normal rate and regular rhythm.     Pulses: Normal pulses.     Heart sounds: Normal heart sounds.  Pulmonary:  Effort: Pulmonary effort is normal.     Breath sounds: Normal breath sounds.  Genitourinary:    Comments: deferred Skin:    General: Skin is warm and dry.  Neurological:     General: No focal deficit present.     Mental Status: She is alert and oriented to person, place, and time.  Psychiatric:        Mood and Affect: Mood normal.        Behavior: Behavior normal.      Assessment: 31 y.o. [id number] for follow up on bleeding after SAB;  Suspect she has passed some POC.  Desires permanent sterilization Not using contraception.   Plan: Problem List Items Addressed This Visit       Other   SAB (spontaneous abortion) - Primary   Relevant Orders   hCG, quantitative, pregnancy   Other Visit Diagnoses     Complete miscarriage         We will draw a quantitative for her.  Offered and started on OCPs. Rx for Loestrin faxed. Follow up based on the quant and schedul for consutl with [facility] for her BTL.  Emily Nash, CNM  01/17/2022 6:59 PM

## 2022-02-20 ENCOUNTER — Encounter: Payer: Self-pay | Admitting: Obstetrics and Gynecology

## 2022-02-20 ENCOUNTER — Ambulatory Visit: Payer: Medicaid Other | Admitting: Obstetrics and Gynecology

## 2022-02-20 VITALS — BP 113/81 | HR 99 | Ht 62.0 in | Wt 236.1 lb

## 2022-02-20 DIAGNOSIS — Z3009 Encounter for other general counseling and advice on contraception: Secondary | ICD-10-CM

## 2022-02-20 NOTE — Progress Notes (Signed)
Patient presents today to discuss having a BTL. She states her reasoning is that she has finished childbearing. No further concerns.

## 2022-02-20 NOTE — Progress Notes (Signed)
HPI:      [name] is a 31 y.o. [id number] who LMP was Patient's last menstrual period was 02/14/2022.  Subjective:   She presents today to discuss permanent sterilization.  She is sure that she is cleared to childbearing.  She recently got pregnant unexpectedly and had a miscarriage. There is another issue as well which is that she has previously had abnormal colposcopies and a LEEP for CIN-3.  Her first Pap after LEEP was normal.  She says that her colposcopies are somewhat traumatizing and she does not want to go through that again if she does not have to.  She is also stated that if her Paps again come back abnormal she prefers hysterectomy over continued follow-up and possibly a second LEEP. She reports that her menstrual periods when not on birth control are very heavy for 2 days and then essentially taper off.    Hx: The following portions of the patient's history were reviewed and updated as appropriate:             She  has a past medical history of Anxiety, GERD (gastroesophageal reflux disease), Ovarian cyst, Tachycardia, and Tobacco use. She does not have any pertinent problems on file. She  has a past surgical history that includes Cholecystectomy; Cesarean section; Induced abortion; LEEP (N/A, 07/28/2017); gallbladder remove; Esophagogastroduodenoscopy (egd) with propofol (N/A, 10/28/2019); Colonoscopy with propofol (N/A, 05/10/2020); and Esophagogastroduodenoscopy (egd) with propofol (N/A, 05/10/2020). Her family history includes Lung cancer in her paternal grandmother. She  reports that she quit smoking about 4 years ago. Her smoking use included cigarettes. She has a 5.50 pack-year smoking history. She has never used smokeless tobacco. She reports that she does not currently use drugs. She reports that she does not drink alcohol. She has a current medication list which includes the following prescription(s): gabapentin and norethindrone-ethinyl estradiol-fe. She is allergic to  latex.       Review of Systems:  Review of Systems  Constitutional: Denied constitutional symptoms, night sweats, recent illness, fatigue, fever, insomnia and weight loss.  Eyes: Denied eye symptoms, eye pain, photophobia, vision change and visual disturbance.  Ears/Nose/Throat/Neck: Denied ear, nose, throat or neck symptoms, hearing loss, nasal discharge, sinus congestion and sore throat.  Cardiovascular: Denied cardiovascular symptoms, arrhythmia, chest pain/pressure, edema, exercise intolerance, orthopnea and palpitations.  Respiratory: Denied pulmonary symptoms, asthma, pleuritic pain, productive sputum, cough, dyspnea and wheezing.  Gastrointestinal: Denied, gastro-esophageal reflux, melena, nausea and vomiting.  Genitourinary: Denied genitourinary symptoms including symptomatic vaginal discharge, pelvic relaxation issues, and urinary complaints.  Musculoskeletal: Denied musculoskeletal symptoms, stiffness, swelling, muscle weakness and myalgia.  Dermatologic: Denied dermatology symptoms, rash and scar.  Neurologic: Denied neurology symptoms, dizziness, headache, neck pain and syncope.  Psychiatric: Denied psychiatric symptoms, anxiety and depression.  Endocrine: Denied endocrine symptoms including hot flashes and night sweats.   Meds:   Current Outpatient Medications on File Prior to Visit  Medication Sig Dispense Refill   gabapentin (NEURONTIN) 300 MG capsule Take 300 mg by mouth at bedtime.     norethindrone-ethinyl estradiol-FE (JUNEL FE 1/20) 1-20 MG-MCG tablet Take 1 tablet by mouth daily. 28 tablet 11   No current facility-administered medications on file prior to visit.      Objective:     Vitals:   02/20/22 1010 02/20/22 1016  BP: (!) 146/78 113/81  Pulse: 92 99   Filed Weights   02/20/22 1010  Weight: 236 lb 1.6 oz (107.1 kg)  Assessment:    [id number] Patient Active Problem List   Diagnosis Date Noted   SAB (spontaneous abortion)  01/17/2022   OSA (obstructive sleep apnea) 12/30/2021   Morbid obesity (HCC) 12/30/2021   Supervision of other normal pregnancy, antepartum 09/09/2021   Food intolerance 07/05/2021   Abdominal pain    Stomach irritation    Altered bowel habits    Headache disorder 03/02/2020   Low back pain 03/02/2020   Palpitations 02/13/2020   Anxiety, generalized 12/15/2019   Tachycardia 12/12/2019   Fatigue 12/10/2019   Weakness 12/10/2019   Acute chest pain 11/26/2019   Anxiety, mild 11/26/2019   Difficulty sleeping 11/26/2019   Numbness and tingling of both legs 11/26/2019   Right upper quadrant pain 11/01/2019   Weight loss 11/01/2019   Dysplasia of cervix, high grade CIN 2 07/12/2019   Other specified disorders of temporomandibular joint 09/16/2018   Body mass index (BMI) 39.0-39.9, adult 04/13/2018     1. Consultation for female sterilization     Patient is considering permanent sterilization.  She is stated that she would like to wait for her next Pap smear which is due in January before having her surgery scheduled.   Plan:            1.  We have discussed permanent sterilization in detail.  Risks and benefits discussed.  We have also discussed long-term methods of birth control including IUD.  She has signed papers today for tubal ligation but she is still considering other methods of birth control.  She has some trepidation regarding her Pap smear and if this comes back abnormal she is somewhat reluctant to undergo colposcopy.  She does state that hysterectomy would be a good option for her if she has a recurrence of cervical dysplasia.  Orders No orders of the defined types were placed in this encounter.   No orders of the defined types were placed in this encounter.     F/U  Return in about 6 weeks (around 04/03/2022) for Annual Physical. I spent 25 minutes involved in the care of this patient preparing to see the patient by obtaining and reviewing her medical history  (including labs, imaging tests and prior procedures), documenting clinical information in the electronic health record (EHR), counseling and coordinating care plans, writing and sending prescriptions, ordering tests or procedures and in direct communicating with the patient and medical staff discussing pertinent items from her history and physical exam.  Elonda Husky, M.D. 02/20/2022 10:48 AM

## 2022-04-08 ENCOUNTER — Ambulatory Visit: Payer: Medicaid Other | Admitting: Obstetrics and Gynecology

## 2022-04-08 DIAGNOSIS — Z01419 Encounter for gynecological examination (general) (routine) without abnormal findings: Secondary | ICD-10-CM

## 2022-04-08 DIAGNOSIS — Z124 Encounter for screening for malignant neoplasm of cervix: Secondary | ICD-10-CM

## 2022-05-13 ENCOUNTER — Ambulatory Visit: Payer: Medicaid Other | Admitting: Obstetrics and Gynecology

## 2022-06-18 NOTE — Progress Notes (Unsigned)
PCP:  Kerri Perches, PA-C   No chief complaint on file.    HPI:      [name] is a 32 y.o. [id number] who LMP was No LMP recorded., presents today for her annual examination.  Her menses are monthly now, lasting 5 days, no BTB, mild dysmen, improved with heating pad.  Sex activity: single partner, contraception--none. Declines other BC for now.  Nexplanon removed finally. S/p SAB 6/23  Last Pap: 04/15/21  Results were neg/neg HPV DNA; 07/26/19  ASCUS/neg HPV DNA. S/p colpo bx 9/22 with Dr. Glennon Mac with CIN 1. 05/07/17 Results were: ASCUS /POS HPV DNA. 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 Due for repeat pap today.   Hx of STDs: HSV, HPV  There is no FH of breast cancer. There is no FH of ovarian cancer. The patient does self-breast exams. Had LT breast pain 12/21 and had neg mammo and u/s. Sx intermittent and pt drinks a lot of caffeine.   Tobacco use: stopped smoking last yr Alcohol use: none No drug use.  Exercise: moderately active  She does get adequate calcium but not Vitamin D in her diet.  Past Medical History:  Diagnosis Date   Anxiety    GERD (gastroesophageal reflux disease)    occ-tums prn   Ovarian cyst    Tachycardia    PT STATES THAT OCC SHE WILL FEEL HER HR GET REALLY FAST AND FEELS LIKE SHE CANT GET HER BREATH-ALL OF THIS RESOLVES WITHIN A COUPLE OF SECONDS   Tobacco use    quit 2020    Past Surgical History:  Procedure Laterality Date   CESAREAN SECTION     CHOLECYSTECTOMY     COLONOSCOPY WITH PROPOFOL N/A 05/10/2020   Procedure: COLONOSCOPY WITH PROPOFOL;  Surgeon: Virgel Manifold, MD;  Location: [facility] ENDOSCOPY;  Service: Endoscopy;  Laterality: N/A;   ESOPHAGOGASTRODUODENOSCOPY (EGD) WITH PROPOFOL N/A 10/28/2019   Procedure: ESOPHAGOGASTRODUODENOSCOPY (EGD) WITH PROPOFOL;  Surgeon: Lesly Rubenstein, MD;  Location: [facility] ENDOSCOPY;  Service: Endoscopy;  Laterality: N/A;   ESOPHAGOGASTRODUODENOSCOPY (EGD) WITH  PROPOFOL N/A 05/10/2020   Procedure: ESOPHAGOGASTRODUODENOSCOPY (EGD) WITH PROPOFOL;  Surgeon: Virgel Manifold, MD;  Location: [facility] ENDOSCOPY;  Service: Endoscopy;  Laterality: N/A;   gallbladder remove     INDUCED ABORTION     LEEP N/A 07/28/2017   Procedure: LOOP ELECTROSURGICAL EXCISION PROCEDURE (LEEP);  Surgeon: Homero Fellers, MD;  Location: [facility] ORS;  Service: Gynecology;  Laterality: N/A;    Family History  Problem Relation Age of Onset   Lung cancer Paternal Grandmother    Breast cancer Neg Hx    Cancer Neg Hx    Heart failure Neg Hx    Diabetes Neg Hx    Stroke Neg Hx    Hypertension Neg Hx     Social History   Socioeconomic History   Marital status: Significant Other    Spouse name: Not on file   Number of children: 2   Years of education: 12   Highest education level: Not on file  Occupational History   Occupation: stay at home mom  Tobacco Use   Smoking status: Former    Packs/day: 0.50    Years: 11.00    Additional pack years: 0.00    Total pack years: 5.50    Types: Cigarettes    Quit date: 05/22/2017    Years since quitting: 5.0   Smokeless tobacco: Never  Vaping Use  Vaping Use: Former  Substance and Sexual Activity   Alcohol use: No   Drug use: Not Currently   Sexual activity: Yes    Birth control/protection: None  Other Topics Concern   Not on file  Social History Narrative   Not on file   Social Determinants of Health   Financial Resource Strain: Low Risk  (09/09/2021)   Overall Financial Resource Strain (CARDIA)    Difficulty of Paying Living Expenses: Not hard at all  Food Insecurity: No Food Insecurity (09/09/2021)   Hunger Vital Sign    Worried About Running Out of Food in the Last Year: Never true    Ran Out of Food in the Last Year: Never true  Transportation Needs: No Transportation Needs (09/09/2021)   PRAPARE - Hydrologist (Medical): No    Lack of Transportation (Non-Medical): No  Physical  Activity: Inactive (09/09/2021)   Exercise Vital Sign    Days of Exercise per Week: 0 days    Minutes of Exercise per Session: 0 min  Stress: No Stress Concern Present (09/09/2021)   [place]    Feeling of Stress : Not at all  Social Connections: Unknown (09/09/2021)   Social Connection and Isolation Panel [NHANES]    Frequency of Communication with Friends and Family: More than three times a week    Frequency of Social Gatherings with Friends and Family: More than three times a week    Attends Religious Services: Not on Diplomatic Services operational officer of Clubs or Organizations: No    Attends Archivist Meetings: Never    Marital Status: Living with partner  Intimate Partner Violence: Not At Risk (09/09/2021)   Humiliation, Afraid, Rape, and Kick questionnaire    Fear of Current or Ex-Partner: No    Emotionally Abused: No    Physically Abused: No    Sexually Abused: No    No outpatient medications have been marked as taking for the 06/19/22 encounter (Appointment) with Krishav Mamone, Elmo Putt B, PA-C.     ROS:  Review of Systems  Constitutional:  Negative for fatigue, fever and unexpected weight change.  Respiratory:  Negative for cough, shortness of breath and wheezing.   Cardiovascular:  Negative for chest pain, palpitations and leg swelling.  Gastrointestinal:  Negative for blood in stool, constipation, diarrhea, nausea and vomiting.  Endocrine: Negative for cold intolerance, heat intolerance and polyuria.  Genitourinary:  Negative for dyspareunia, dysuria, flank pain, frequency, genital sores, hematuria, menstrual problem, pelvic pain, urgency, vaginal bleeding, vaginal discharge and vaginal pain.  Musculoskeletal:  Negative for back pain, joint swelling and myalgias.  Skin:  Negative for rash.  Neurological:  Positive for headaches. Negative for dizziness, syncope, light-headedness and numbness.  Hematological:   Negative for adenopathy.  Psychiatric/Behavioral:  Negative for agitation, confusion, sleep disturbance and suicidal ideas. The patient is not nervous/anxious.      Objective: There were no vitals taken for this visit.   Physical Exam Constitutional:      Appearance: She is well-developed.  Genitourinary:     Vulva normal.     Right Labia: No rash, tenderness or lesions.    Left Labia: No tenderness, lesions or rash.    No vaginal discharge, erythema or tenderness.      Right Adnexa: not tender and no mass present.    Left Adnexa: not tender and no mass present.    No cervical motion tenderness, friability or polyp.  Uterus is not enlarged or tender.  Breasts:    Right: No mass, nipple discharge, skin change or tenderness.     Left: No mass, nipple discharge, skin change or tenderness.  Neck:     Thyroid: No thyromegaly.  Cardiovascular:     Rate and Rhythm: Normal rate and regular rhythm.     Heart sounds: Normal heart sounds. No murmur heard. Pulmonary:     Effort: Pulmonary effort is normal.     Breath sounds: Normal breath sounds.  Abdominal:     Palpations: Abdomen is soft.     Tenderness: There is no abdominal tenderness. There is no guarding or rebound.  Musculoskeletal:        General: Normal range of motion.     Cervical back: Normal range of motion.  Lymphadenopathy:     Cervical: No cervical adenopathy.  Neurological:     General: No focal deficit present.     Mental Status: She is alert and oriented to person, place, and time.     Cranial Nerves: No cranial nerve deficit.  Skin:    General: Skin is warm and dry.  Psychiatric:        Mood and Affect: Mood normal.        Behavior: Behavior normal.        Thought Content: Thought content normal.        Judgment: Judgment normal.  Vitals reviewed.     Assessment/Plan: Encounter for annual routine gynecological examination  Cervical cancer screening - Plan: Cytology - PAP  Screening for HPV  (human papillomavirus) - Plan: Cytology - PAP  Dysplasia of cervix, low grade (CIN 1) - Plan: Cytology - PAP; repeat pap today. Will f/u with results and dispo. Pt thought last colpo/bx was really painful, prefers not to do that again if possible.   Left breast pain--with neg mammo/u/s in past. Neg breast exam today. D/C caffeine. F/u prn.   GYN counsel adequate intake of calcium and vitamin D, diet and exercise.     F/U  No follow-ups on file.  Myshawn Chiriboga B. Win Guajardo, PA-C 06/18/2022 12:32 PM

## 2022-06-19 ENCOUNTER — Ambulatory Visit (INDEPENDENT_AMBULATORY_CARE_PROVIDER_SITE_OTHER): Payer: Medicaid Other | Admitting: Obstetrics and Gynecology

## 2022-06-19 ENCOUNTER — Encounter: Payer: Self-pay | Admitting: Obstetrics and Gynecology

## 2022-06-19 ENCOUNTER — Other Ambulatory Visit (HOSPITAL_COMMUNITY)
Admission: RE | Admit: 2022-06-19 | Discharge: 2022-06-19 | Disposition: A | Discharge status: Home / Self Care | Payer: Medicaid Other | Source: Ambulatory Visit | Attending: Obstetrics and Gynecology | Admitting: Obstetrics and Gynecology

## 2022-06-19 VITALS — BP 120/80 | Ht 62.0 in | Wt 233.0 lb

## 2022-06-19 DIAGNOSIS — Z1151 Encounter for screening for human papillomavirus (HPV): Secondary | ICD-10-CM

## 2022-06-19 DIAGNOSIS — N870 Mild cervical dysplasia: Secondary | ICD-10-CM | POA: Insufficient documentation

## 2022-06-19 DIAGNOSIS — Z01419 Encounter for gynecological examination (general) (routine) without abnormal findings: Secondary | ICD-10-CM | POA: Diagnosis not present

## 2022-06-19 DIAGNOSIS — Z124 Encounter for screening for malignant neoplasm of cervix: Secondary | ICD-10-CM | POA: Diagnosis present

## 2022-06-19 DIAGNOSIS — Z3009 Encounter for other general counseling and advice on contraception: Secondary | ICD-10-CM

## 2022-06-19 DIAGNOSIS — R102 Pelvic and perineal pain: Secondary | ICD-10-CM

## 2022-06-19 HISTORY — DX: Mild cervical dysplasia: N87.0

## 2022-06-19 NOTE — Patient Instructions (Signed)
I value your feedback and you entrusting us with your care. If you get a [facility] patient survey, I would appreciate you taking the time to let us know about your experience today. Thank you! ? ? ?

## 2022-06-23 LAB — CYTOLOGY - PAP
Adequacy: ABSENT
Comment: NEGATIVE
Diagnosis: NEGATIVE
High risk HPV: NEGATIVE

## 2022-06-24 ENCOUNTER — Other Ambulatory Visit: Payer: Self-pay | Admitting: Physician Assistant

## 2022-06-24 DIAGNOSIS — R1084 Generalized abdominal pain: Secondary | ICD-10-CM

## 2022-07-11 ENCOUNTER — Encounter: Payer: Self-pay | Admitting: Obstetrics and Gynecology

## 2022-07-11 DIAGNOSIS — R102 Pelvic and perineal pain: Secondary | ICD-10-CM

## 2022-07-11 DIAGNOSIS — R9389 Abnormal findings on diagnostic imaging of other specified body structures: Secondary | ICD-10-CM

## 2022-07-11 NOTE — Telephone Encounter (Signed)
Pls call pt to schedule annual about day 7-10 of cycle. Thx.

## 2022-07-15 ENCOUNTER — Ambulatory Visit
Admission: RE | Admit: 2022-07-15 | Discharge: 2022-07-15 | Disposition: A | Discharge status: Home / Self Care | Payer: Medicaid Other | Source: Ambulatory Visit | Attending: Obstetrics and Gynecology | Admitting: Obstetrics and Gynecology

## 2022-07-15 DIAGNOSIS — R102 Pelvic and perineal pain: Secondary | ICD-10-CM | POA: Insufficient documentation

## 2022-07-15 DIAGNOSIS — R9389 Abnormal findings on diagnostic imaging of other specified body structures: Secondary | ICD-10-CM | POA: Insufficient documentation

## 2022-07-15 NOTE — Telephone Encounter (Signed)
The patient is scheduled for 4/23 at [facility] for ultrasound.

## 2022-09-09 ENCOUNTER — Other Ambulatory Visit: Payer: Self-pay | Admitting: Physician Assistant

## 2022-09-09 DIAGNOSIS — R1084 Generalized abdominal pain: Secondary | ICD-10-CM

## 2022-09-16 ENCOUNTER — Ambulatory Visit: Admission: RE | Admit: 2022-09-16 | Payer: Medicaid Other | Source: Ambulatory Visit

## 2022-09-19 ENCOUNTER — Ambulatory Visit: Admission: RE | Admit: 2022-09-19 | Payer: Medicaid Other | Source: Ambulatory Visit

## 2022-09-26 ENCOUNTER — Ambulatory Visit: Payer: Medicaid Other

## 2022-10-03 ENCOUNTER — Ambulatory Visit
Admission: RE | Admit: 2022-10-03 | Discharge: 2022-10-03 | Disposition: A | Discharge status: Home / Self Care | Payer: Medicaid Other | Source: Ambulatory Visit | Attending: Physician Assistant | Admitting: Physician Assistant

## 2022-10-03 ENCOUNTER — Ambulatory Visit: Admission: RE | Admit: 2022-10-03 | Payer: Medicaid Other | Source: Ambulatory Visit

## 2022-10-03 DIAGNOSIS — R1084 Generalized abdominal pain: Secondary | ICD-10-CM | POA: Insufficient documentation

## 2022-10-03 MED ORDER — IOHEXOL 300 MG/ML  SOLN
100.0000 mL | Freq: Once | INTRAMUSCULAR | Status: AC | PRN
  Administered 2022-10-03: 100 mL via INTRAVENOUS

## 2023-07-17 ENCOUNTER — Encounter: Payer: Self-pay | Admitting: Intensive Care

## 2023-07-17 ENCOUNTER — Emergency Department
Admission: EM | Admit: 2023-07-17 | Discharge: 2023-07-17 | Disposition: A | Discharge status: Home / Self Care | Attending: Emergency Medicine | Admitting: Emergency Medicine

## 2023-07-17 ENCOUNTER — Other Ambulatory Visit: Payer: Self-pay

## 2023-07-17 DIAGNOSIS — D72829 Elevated white blood cell count, unspecified: Secondary | ICD-10-CM | POA: Diagnosis not present

## 2023-07-17 DIAGNOSIS — K2900 Acute gastritis without bleeding: Secondary | ICD-10-CM | POA: Diagnosis not present

## 2023-07-17 DIAGNOSIS — R112 Nausea with vomiting, unspecified: Secondary | ICD-10-CM

## 2023-07-17 DIAGNOSIS — R1013 Epigastric pain: Secondary | ICD-10-CM

## 2023-07-17 DIAGNOSIS — R109 Unspecified abdominal pain: Secondary | ICD-10-CM | POA: Diagnosis present

## 2023-07-17 HISTORY — DX: Insomnia, unspecified: G47.00

## 2023-07-17 HISTORY — DX: Fatty (change of) liver, not elsewhere classified: K76.0

## 2023-07-17 LAB — CBC
HCT: 42.5 % (ref 36.0–46.0)
Hemoglobin: 14.7 g/dL (ref 12.0–15.0)
MCH: 30.3 pg (ref 26.0–34.0)
MCHC: 34.6 g/dL (ref 30.0–36.0)
MCV: 87.6 fL (ref 80.0–100.0)
Platelets: 329 10*3/uL (ref 150–400)
RBC: 4.85 MIL/uL (ref 3.87–5.11)
RDW: 11.9 % (ref 11.5–15.5)
WBC: 13 10*3/uL — ABNORMAL HIGH (ref 4.0–10.5)
nRBC: 0 % (ref 0.0–0.2)

## 2023-07-17 LAB — URINALYSIS, ROUTINE W REFLEX MICROSCOPIC
Bilirubin Urine: NEGATIVE
Glucose, UA: NEGATIVE mg/dL
Hgb urine dipstick: NEGATIVE
Ketones, ur: NEGATIVE mg/dL
Nitrite: NEGATIVE
Protein, ur: NEGATIVE mg/dL
Specific Gravity, Urine: 1.016 (ref 1.005–1.030)
pH: 5 (ref 5.0–8.0)

## 2023-07-17 LAB — COMPREHENSIVE METABOLIC PANEL WITH GFR
ALT: 26 U/L (ref 0–44)
AST: 21 U/L (ref 15–41)
Albumin: 3.8 g/dL (ref 3.5–5.0)
Alkaline Phosphatase: 69 U/L (ref 38–126)
Anion gap: 6 (ref 5–15)
BUN: 12 mg/dL (ref 6–20)
CO2: 22 mmol/L (ref 22–32)
Calcium: 8.8 mg/dL — ABNORMAL LOW (ref 8.9–10.3)
Chloride: 108 mmol/L (ref 98–111)
Creatinine, Ser: 0.8 mg/dL (ref 0.44–1.00)
GFR, Estimated: >60 mL/min (ref >60)
Glucose, Bld: 114 mg/dL — ABNORMAL HIGH (ref 70–99)
Potassium: 4.1 mmol/L (ref 3.5–5.1)
Sodium: 136 mmol/L (ref 135–145)
Total Bilirubin: 0.4 mg/dL (ref 0.0–1.2)
Total Protein: 7.9 g/dL (ref 6.5–8.1)

## 2023-07-17 LAB — POC URINE PREG, ED: Preg Test, Ur: NEGATIVE

## 2023-07-17 LAB — LIPASE, BLOOD: Lipase: 31 U/L (ref 11–51)

## 2023-07-17 MED ORDER — ONDANSETRON 4 MG PO TBDP
4.0000 mg | ORAL_TABLET | Freq: Once | ORAL | Status: AC
  Administered 2023-07-17: 4 mg via ORAL
  Filled 2023-07-17: qty 1

## 2023-07-17 MED ORDER — LIDOCAINE VISCOUS HCL 2 % MT SOLN
15.0000 mL | Freq: Once | OROMUCOSAL | Status: DC
  Filled 2023-07-17: qty 15

## 2023-07-17 MED ORDER — FAMOTIDINE 20 MG PO TABS: 20.0000 mg | ORAL_TABLET | Freq: Every day | ORAL | 1 refills | Status: DC

## 2023-07-17 MED ORDER — ALUM & MAG HYDROXIDE-SIMETH 200-200-20 MG/5ML PO SUSP
30.0000 mL | Freq: Once | ORAL | Status: AC
  Administered 2023-07-17: 30 mL via ORAL
  Filled 2023-07-17: qty 30

## 2023-07-17 MED ORDER — ONDANSETRON 4 MG PO TBDP: 4.0000 mg | ORAL_TABLET | Freq: Three times a day (TID) | ORAL | 0 refills | Status: DC | PRN

## 2023-07-17 NOTE — ED Notes (Signed)
 Pt states she can only tolerate half of the Maalox and refused the lidocaine . EDP made aware.

## 2023-07-17 NOTE — ED Provider Notes (Signed)
 [facility] Provider Note    Event Date/Time   First MD Initiated Contact with Patient 07/17/23 0830     (approximate)   History   Emesis and Abdominal Pain   HPI  Emily Nash is a 33 y.o. female who presents to the ED for evaluation of Emesis and Abdominal Pain   I review routine annual visit from 1 year ago at her OB/GYN.  Morbidly obese patient with history of gastritis and GERD.  Patient presents for evaluation of epigastric burning sensation and nausea over the past 6 or 7 hours.  Symptoms started overnight.  Reports severe nausea and diarrhea but no emesis (despite being triage is having emesis).  No lower abdominal pain.  She reports urinary frequency without dysuria, hematuria. She is s/p cholecystectomy  Physical Exam   Triage Vital Signs: ED Triage Vitals [07/17/23 0819]  Encounter Vitals Group     BP (!) 140/101     Systolic BP Percentile      Diastolic BP Percentile      Pulse Rate 99     Resp 18     Temp 98.4 F (36.9 C)     Temp Source Oral     SpO2 97 %     Weight 225 lb (102.1 kg)     Height 5\' 2"  (1.575 m)     Head Circumference      Peak Flow      Pain Score 7     Pain Loc      Pain Education      Exclude from Growth Chart     Most recent vital signs: Vitals:   07/17/23 0819  BP: (!) 140/101  Pulse: 99  Resp: 18  Temp: 98.4 F (36.9 C)  SpO2: 97%    General: Awake, no distress.  Morbidly obese, well-appearing CV:  Good peripheral perfusion.  Resp:  Normal effort.  Abd:  No distention.  Soft.  Minimally tender to LUQ.  No guarding or peritoneal features.  No lower abdominal tenderness. MSK:  No deformity noted.  Neuro:  No focal deficits appreciated. Other:     ED Results / Procedures / Treatments   Labs (all labs ordered are listed, but only abnormal results are displayed) Labs Reviewed  COMPREHENSIVE METABOLIC PANEL WITH GFR - Abnormal; Notable for the following components:      Result Value    Glucose, Bld 114 (*)    Calcium 8.8 (*)    All other components within normal limits  CBC - Abnormal; Notable for the following components:   WBC 13.0 (*)    All other components within normal limits  URINALYSIS, ROUTINE W REFLEX MICROSCOPIC - Abnormal; Notable for the following components:   Color, Urine YELLOW (*)    APPearance CLEAR (*)    Leukocytes,Ua SMALL (*)    Bacteria, UA RARE (*)    All other components within normal limits  URINE CULTURE  LIPASE, BLOOD  POC URINE PREG, ED    EKG   RADIOLOGY   Official radiology report(s): No results found.  PROCEDURES and INTERVENTIONS:  Procedures  Medications  lidocaine  (XYLOCAINE ) 2 % viscous mouth solution 15 mL (15 mLs Mouth/Throat Patient Refused/Not Given 07/17/23 0912)  ondansetron  (ZOFRAN -ODT) disintegrating tablet 4 mg (4 mg Oral Given 07/17/23 0913)  alum & mag hydroxide-simeth (MAALOX/MYLANTA) 200-200-20 MG/5ML suspension 30 mL (30 mLs Oral Given 07/17/23 0912)     IMPRESSION / MDM / ASSESSMENT AND PLAN / ED COURSE  I  reviewed the triage vital signs and the nursing notes.  Differential diagnosis includes, but is not limited to, GERD or gastritis, SBO, IBS, choledocholithiasis, viral etiology  {Patient presents with symptoms of an acute illness or injury that is potentially life-threatening.  Patient presents with epigastric burning and nausea of likely gastric etiology, with a benign workup and ultimately suitable for outpatient management.  Look systemically well has some mild localized tenderness.  Leukocytosis is noted but I doubt infectious etiology of her symptoms.  Normal lipase, LFTs and metabolic panel.  Urine without clear infectious features.  We will start her on an H2 blocker and have her follow-up as an outpatient.  We discussed ED return precautions.  Clinical Course as of 07/17/23 1029  Fri Jul 17, 2023  1029 Reassessed, feeling better, tolerating p.o.  We discussed gastritis, starting an H2 blocker,  antiemetic, outpatient follow-up and ED return precautions [DS]    Clinical Course User Index [DS] Arline Bennett, MD     FINAL CLINICAL IMPRESSION(S) / ED DIAGNOSES   Final diagnoses:  Nausea and vomiting, unspecified vomiting type  Epigastric pain  Acute gastritis without hemorrhage, unspecified gastritis type     Rx / DC Orders   ED Discharge Orders          Ordered    famotidine  (PEPCID ) 20 MG tablet  Daily        07/17/23 1028    ondansetron  (ZOFRAN -ODT) 4 MG disintegrating tablet  Every 8 hours PRN        07/17/23 1028             Note:  This document was prepared using Dragon voice recognition software and may include unintentional dictation errors.   Arline Bennett, MD 07/17/23 1030

## 2023-07-17 NOTE — ED Triage Notes (Signed)
Patient c/o abdominal pain with N/V/D.

## 2023-07-18 LAB — URINE CULTURE: Culture: <10000 — AB

## 2023-07-30 ENCOUNTER — Ambulatory Visit: Admitting: Obstetrics and Gynecology

## 2023-08-31 NOTE — Progress Notes (Unsigned)
 PCP:  Deborah Falling, PA-C   No chief complaint on file.    HPI:      [name] is a 33 y.o. [phone] who LMP was No LMP recorded., presents today for her annual examination.  Her menses are monthly now, lasting 3-4 days, no BTB, mild dysmen, improved with heating pad. Hx of gastritis so can't take NSAIDs. Pt is s/p SAB 7/23 but waited to pass naturally. Went to ED 9/23 for pelvic pain/bleeding and had neg serum beta and GYN u/s had thickened EM=22 but no retained POC. Pt then went home and passed large mass/clot and pain/bleeding resolved. Menses were heavier once returned to monthly but LMP was back to normal. Does get occas fleeting, sharp pains in suprapubic area; no aggrav/allev factors; sx maybe once wkly. No GI sx.   Sex activity: single partner, contraception--none. Declines BC for now, may consider. No pain/bleeding. Has noticed different vaginal odor since SAB. Not fishy, no increased vag d/c, no irritation. No new partners. Using cucumber Dove soap (only once she can use to prevent HSV sx), using scented detergent, no dryer sheets.   Last Pap: 06/19/22 Results were NILM/neg HPV DNA 04/15/21  Results were neg/neg HPV DNA 07/26/19  ASCUS/neg HPV DNA. S/p colpo bx 9/22 with Dr. Cleora Daft with CIN 1.  05/07/17 Results were: ASCUS /POS HPV DNA.  Had colpo with Dr. Suzann Ernst 06/03/17 with CIN 1-2; s/p LEEP 5/19 with CIN 1.   Hx of STDs: HSV, HPV  There is no FH of breast cancer. There is no FH of ovarian cancer. The patient does self-breast exams.   Tobacco use: stopped smoking  Alcohol use: none No drug use.  Exercise: moin active  She does get adequate calcium and Vitamin D in her diet.  Past Medical History:  Diagnosis Date   Anxiety    Fatty liver disease, nonalcoholic    GERD (gastroesophageal reflux disease)    occ-tums prn   Insomnia    Ovarian cyst    Tachycardia    PT STATES THAT OCC SHE WILL FEEL HER HR GET REALLY FAST AND FEELS LIKE SHE CANT GET HER BREATH-ALL OF  THIS RESOLVES WITHIN A COUPLE OF SECONDS   Tobacco use    quit 2020    Past Surgical History:  Procedure Laterality Date   CESAREAN SECTION     CHOLECYSTECTOMY     COLONOSCOPY WITH PROPOFOL  N/A 05/10/2020   Procedure: COLONOSCOPY WITH PROPOFOL ;  Surgeon: Irby Mannan, MD;  Location: [facility] END[facility];  Service: Endoscopy;  Laterality: N/A;   ESOPHAGOGASTRODUODENOSCOPY (EGD) WITH PROPOFOL  N/A 10/28/2019   Procedure: ESOPHAGOGASTRODUODENOSCOPY (EGD) WITH PROPOFOL ;  Surgeon: Shane Darling, MD;  Location: [facility] END[facility]COPY;  Service: Endoscopy;  Laterality: N/A;   ESOPHAGOGASTRODUODENOSCOPY (EGD) WITH PROPOFOL  N/A 05/10/2020   Procedure: ESOPHAGOGASTRODUODENOSCOPY (EGD) WITH PROPOFOL ;  Surgeon: Irby Mannan, MD;  Location: [facility] ENDOSCOPY;  Service: Endoscopy;  Laterality: N/A;   gallbladder remove     INDUCED ABORTION     LEEP N/A 07/28/2017   Procedure: LOOP ELECTROSURGICAL EXCISION PROCEDURE (LEEP);  Surgeon: Heron Lord, MD;  Location: [facility] ORS;  Service: Gynecology;  Laterality: N/A;    Family History  Problem Relation Age of Onset   Lung cancer Paternal Grandmother    Breast cancer Neg Hx    Cancer Neg Hx    Heart failure Neg Hx    Diabetes Neg Hx    Stroke Neg Hx    Hypertension Neg Hx  Social History   Socioeconomic History   Marital status: Significant Other    Spouse name: Not on file   Number of children: 2   Years of education: 12   Highest education level: Not on file  Occupational History   Occupation: stay at home mom  Tobacco Use   Smoking status: Former    Current packs/day: 0.00    Average packs/day: 0.5 packs/day for 11.0 years (5.5 ttl pk-yrs)    Types: Cigarettes    Start date: 05/23/2006    Quit date: 05/22/2017    Years since quitting: 6.2   Smokeless tobacco: Never  Vaping Use   Vaping status: Former  Substance and Sexual Activity   Alcohol use: No   Drug use: Not Currently   Sexual activity: Yes    Birth  control/protection: None  Other Topics Concern   Not on file  Social History Narrative   Not on file   Social Drivers of Health   Financial Resource Strain: Low Risk  (09/09/2021)   Overall Financial Resource Strain (CARDIA)    Difficulty of Paying Living Expenses: Not hard at all  Food Insecurity: No Food Insecurity (09/09/2021)   Hunger Vital Sign    Worried About Running Out of Food in the Last Year: Never true    Ran Out of Food in the Last Year: Never true  Transportation Needs: No Transportation Needs (09/09/2021)   PRAPARE - Administrator, Civil Service (Medical): No    Lack of Transportation (Non-Medical): No  Physical Activity: Inactive (09/09/2021)   Exercise Vital Sign    Days of Exercise per Week: 0 days    Minutes of Exercise per Session: 0 min  Stress: No Stress Concern Present (09/09/2021)   [employer] of Occupational Health - Occupational Stress Questionnaire    Feeling of Stress : Not at all  Social Connections: Unknown (09/09/2021)   Social Connection and Isolation Panel [NHANES]    Frequency of Communication with Friends and Family: More than three times a week    Frequency of Social Gatherings with Friends and Family: More than three times a week    Attends Religious Services: Not on Insurance claims handler of Clubs or Organizations: No    Attends Banker Meetings: Never    Marital Status: Living with partner  Intimate Partner Violence: Not At Risk (09/09/2021)   Humiliation, Afraid, Rape, and Kick questionnaire    Fear of Current or Ex-Partner: No    Emotionally Abused: No    Physically Abused: No    Sexually Abused: No    No outpatient medications have been marked as taking for the 09/01/23 encounter (Appointment) with Shatoria Stooksbury, Evalyn Hillier B, PA-C.     ROS:  Review of Systems  Constitutional:  Negative for fatigue, fever and unexpected weight change.  Respiratory:  Negative for cough, shortness of breath and wheezing.    Cardiovascular:  Negative for chest pain, palpitations and leg swelling.  Gastrointestinal:  Negative for blood in stool, constipation, diarrhea, nausea and vomiting.  Endocrine: Negative for cold intolerance, heat intolerance and polyuria.  Genitourinary:  Positive for pelvic pain. Negative for dyspareunia, dysuria, flank pain, frequency, genital sores, hematuria, menstrual problem, urgency, vaginal bleeding, vaginal discharge and vaginal pain.  Musculoskeletal:  Negative for back pain, joint swelling and myalgias.  Skin:  Negative for rash.  Neurological:  Negative for dizziness, syncope, light-headedness, numbness and headaches.  Hematological:  Negative for adenopathy.  Psychiatric/Behavioral:  Negative for agitation,  confusion, sleep disturbance and suicidal ideas. The patient is not nervous/anxious.      Objective: There were no vitals taken for this visit.   Physical Exam Constitutional:      Appearance: She is well-developed.  Genitourinary:     Vulva normal.     Right Labia: No rash, tenderness or lesions.    Left Labia: No tenderness, lesions or rash.    No vaginal discharge, erythema or tenderness.      Right Adnexa: not tender and no mass present.    Left Adnexa: not tender and no mass present.    No cervical motion tenderness, friability or polyp.     Uterus is not enlarged or tender.  Breasts:    Right: No mass, nipple discharge, skin change or tenderness.     Left: No mass, nipple discharge, skin change or tenderness.  Neck:     Thyroid : No thyromegaly.  Cardiovascular:     Rate and Rhythm: Normal rate and regular rhythm.     Heart sounds: Normal heart sounds. No murmur heard. Pulmonary:     Effort: Pulmonary effort is normal.     Breath sounds: Normal breath sounds.  Abdominal:     Palpations: Abdomen is soft.     Tenderness: There is no abdominal tenderness. There is no guarding or rebound.  Musculoskeletal:        General: Normal range of motion.      Cervical back: Normal range of motion.  Lymphadenopathy:     Cervical: No cervical adenopathy.  Neurological:     General: No focal deficit present.     Mental Status: She is alert and oriented to person, place, and time.     Cranial Nerves: No cranial nerve deficit.  Skin:    General: Skin is warm and dry.  Psychiatric:        Mood and Affect: Mood normal.        Behavior: Behavior normal.        Thought Content: Thought content normal.        Judgment: Judgment normal.  Vitals reviewed.     Assessment/Plan: Encounter for annual routine gynecological examination  Cervical cancer screening - Plan: Cytology - PAP  Screening for HPV (human papillomavirus) - Plan: Cytology - PAP  Dysplasia of cervix, low grade (CIN 1) - Plan: Cytology - PAP; repeat pap today. Will f/u if abn.   Pelvic pain--intermittent, stabbing pains, maybe once wkly. Check GYN u/s after next menses given abn EM and sx 9/23. Pt to call with period and will schedule GYN u/s.   Encounter for other general counseling or advice on contraception--pt to call if desires BC.   GYN counsel adequate intake of calcium and vitamin D, diet and exercise.     F/U  No follow-ups on file.  Can Lucci B. Sabien Umland, PA-C 08/31/2023 8:34 PM

## 2023-09-01 ENCOUNTER — Ambulatory Visit (INDEPENDENT_AMBULATORY_CARE_PROVIDER_SITE_OTHER): Admitting: Obstetrics and Gynecology

## 2023-09-01 ENCOUNTER — Encounter: Payer: Self-pay | Admitting: Obstetrics and Gynecology

## 2023-09-01 ENCOUNTER — Other Ambulatory Visit (HOSPITAL_COMMUNITY)
Admission: RE | Admit: 2023-09-01 | Discharge: 2023-09-01 | Disposition: A | Discharge status: Home / Self Care | Source: Ambulatory Visit | Attending: Obstetrics and Gynecology | Admitting: Obstetrics and Gynecology

## 2023-09-01 VITALS — BP 113/77 | HR 84 | Ht 62.0 in | Wt 220.0 lb

## 2023-09-01 DIAGNOSIS — Z124 Encounter for screening for malignant neoplasm of cervix: Secondary | ICD-10-CM

## 2023-09-01 DIAGNOSIS — N870 Mild cervical dysplasia: Secondary | ICD-10-CM

## 2023-09-01 DIAGNOSIS — Z1151 Encounter for screening for human papillomavirus (HPV): Secondary | ICD-10-CM | POA: Insufficient documentation

## 2023-09-01 DIAGNOSIS — Z01419 Encounter for gynecological examination (general) (routine) without abnormal findings: Secondary | ICD-10-CM | POA: Diagnosis not present

## 2023-09-01 NOTE — Patient Instructions (Signed)
 I value your feedback and you entrusting [place] with your care. If you get a [place] patient survey, I would appreciate you taking the time to let us know about your experience today. Thank you! ? ? ?

## 2023-09-02 LAB — CYTOLOGY - PAP
Comment: NEGATIVE
Diagnosis: NEGATIVE
High risk HPV: NEGATIVE

## 2023-09-16 ENCOUNTER — Telehealth: Payer: Self-pay

## 2023-09-16 DIAGNOSIS — R102 Pelvic and perineal pain: Secondary | ICD-10-CM

## 2023-09-16 NOTE — Telephone Encounter (Signed)
 Pt calling triage with complaints of pelvic pain and low back pain for the last 5-6 days. Saw her PCP this morning [name]) and had labs, urine test for UTI and UPT. UTI test and UPT were negative. States she last had a cycle 5/7 and nothing since then. Had multiple neg UPT's at home. Advised msg would be sent to [facility] and if that pain gets severe to go to ER!

## 2023-09-16 NOTE — Telephone Encounter (Signed)
 Sx could be since late for period and having symptoms of that. No one has openings for work-in appt tomorrow. Can be put on call back list if any cancellations (let FD know) or can see if any appts tomorrow. Go to ED if pain significant. Can try ibup/heating pad.

## 2023-09-17 NOTE — Addendum Note (Signed)
 Addended by: WATT HILA B on: 09/17/2023 09:07 AM   Modules accepted: Orders

## 2023-09-17 NOTE — Telephone Encounter (Signed)
 No office openings today. Pt to [facility] tomorrow for GYN u/s opening. Order placed.

## 2023-09-18 ENCOUNTER — Ambulatory Visit (INDEPENDENT_AMBULATORY_CARE_PROVIDER_SITE_OTHER)

## 2023-09-18 DIAGNOSIS — R102 Pelvic and perineal pain: Secondary | ICD-10-CM | POA: Diagnosis not present

## 2023-09-18 DIAGNOSIS — N912 Amenorrhea, unspecified: Secondary | ICD-10-CM

## 2023-09-19 ENCOUNTER — Encounter: Payer: Self-pay | Admitting: Obstetrics and Gynecology

## 2023-09-30 ENCOUNTER — Other Ambulatory Visit: Payer: Self-pay

## 2023-09-30 ENCOUNTER — Emergency Department
Admission: EM | Admit: 2023-09-30 | Discharge: 2023-09-30 | Discharge status: Against Medical Advice | Attending: Emergency Medicine | Admitting: Emergency Medicine

## 2023-09-30 DIAGNOSIS — R102 Pelvic and perineal pain: Secondary | ICD-10-CM | POA: Diagnosis not present

## 2023-09-30 DIAGNOSIS — M25559 Pain in unspecified hip: Secondary | ICD-10-CM | POA: Insufficient documentation

## 2023-09-30 DIAGNOSIS — Z5321 Procedure and treatment not carried out due to patient leaving prior to being seen by health care provider: Secondary | ICD-10-CM | POA: Diagnosis not present

## 2023-09-30 NOTE — ED Triage Notes (Addendum)
 Pt sts that she has been having pelvic pain that radiates to her lower back. Pt sts that she has not been having any discharge. Pt sts that she has been to her OB and they did a US  and said everything was WNL.

## 2023-10-01 ENCOUNTER — Other Ambulatory Visit: Payer: Self-pay | Admitting: Obstetrics and Gynecology

## 2023-10-01 DIAGNOSIS — Z30011 Encounter for initial prescription of contraceptive pills: Secondary | ICD-10-CM

## 2023-10-01 MED ORDER — NORETHIN ACE-ETH ESTRAD-FE 1-20 MG-MCG PO TABS: 1.0000 | ORAL_TABLET | Freq: Every day | ORAL | 1 refills | Status: AC

## 2023-10-01 NOTE — Progress Notes (Signed)
 Rx OCPs for pelvic pain/being eval for endometriosis.

## 2023-10-22 NOTE — Progress Notes (Signed)
 Obstetrics and Gynecology Visit New Patient Evaluation  Appointment Date: 10/23/2023  Primary Care Provider: Cecily Katz  Chief Complaint:  Chief Complaint  Patient presents with  . Advice Only    Room 10    c/o lower back , pelvic pain , pressure in buttocks,  (endometriosis)    dmartin    History of Present Illness: Emily Nash is a 33 y.o.  [id number] (Patient's last menstrual period was 09/19/2023 (approximate).)  History of Present Illness Emily Nash is a 33 year old female who presents with pelvic pain and irregular menstrual cycles.  She experiences irregular menstrual cycles, characterized by skipping periods and severe cramping. She describes constant pelvic pain, which is also present in her buttocks. The pain is persistent, though it eases slightly at times. These symptoms have been present for several years but have recently worsened, significantly affecting her daily life.  She underwent an ultrasound at her OB GYN's office, which was reported as clear. Due to worsening pain, she visited the ER at [facility], where a CT scan was performed and showed no abnormalities. Her OB GYN does not specialize in endometriosis, so she was referred for further evaluation.  She has a history of abdominal pain over the past four to five years, for which she has consulted various specialists, including gastroenterologists and neurologists, without a definitive diagnosis. She was previously advised to consider endometriosis but opted against surgery at that time as her symptoms were not as severe.  No pain with bowel movements but experiences cramping afterward. She has regular bowel movements and no issues with urination, although she occasionally experiences symptoms resembling a UTI, which resolve with increased water intake. She has not started birth control pills despite being prescribed them, as she wanted to consult further before beginning treatment.  Her family history includes her  mother having a hysterectomy at a young age due to cysts, with a mention of possible endometriosis. She has had her gallbladder removed in 2015, an emergency C-section in 2014, and a LEEP procedure. She has two children, one delivered vaginally in 2013 and one via C-section in 2014. She does not smoke or drink and has no interest in having more children.  She has been on a waiting list to see a gastroenterologist due to bowel issues, with an appointment scheduled for December. She had a colonoscopy three years ago, which was normal, but her symptoms have developed since then. She also mentions a history of chronic gastritis and was previously informed of fatty liver disease, though recent imaging did not confirm this.   Review of Systems: Pertinent positive/negative documented in the HPI, all other systems reviewed and negative   Patient Active Problem List   Diagnosis Date Noted  . OSA (obstructive sleep apnea) 12/30/2021  . Headache disorder 03/02/2020  . Low back pain 03/02/2020  . Palpitations 02/13/2020  . Anxiety, generalized 12/15/2019  . Tachycardia 12/12/2019  . Fatigue 12/10/2019  . Acute chest pain 11/26/2019  . Difficulty sleeping 11/26/2019  . Anxiety, mild 11/26/2019  . Numbness and tingling of right leg 11/26/2019  . Right upper quadrant pain 11/01/2019  . Weight loss 11/01/2019  . Dysplasia of cervix, low grade (CIN 1) 07/12/2019  . Other specified disorders of temporomandibular joint 09/16/2018  . Body mass index (BMI) 39.0-39.9, adult 04/13/2018    Past Medical History:  Past Medical History:  Diagnosis Date  . Anxiety   . COVID-19   . Depression   . Ovarian cyst  Past Surgical History:  Past Surgical History:  Procedure Laterality Date  . EGD  10/28/2019   Gastritis/No Repeat/CTL  . CERVICAL BIOPSY  W/ LOOP ELECTRODE EXCISION    . CESAREAN SECTION    . CHOLECYSTECTOMY    . COLONOSCOPY    . EGD    . Gallbladder Remove  5 years ago     Social  History:  Social History   Socioeconomic History  . Marital status: Single  Tobacco Use  . Smoking status: Former    Current packs/day: 0.00    Types: Cigarettes    Quit date: 05/23/2018    Years since quitting: 5.4  . Smokeless tobacco: Never  Vaping Use  . Vaping status: Never Used  Substance and Sexual Activity  . Alcohol use: Not Currently  . Drug use: Not Currently    Comment: Marijuana, Last use 08/2019  . Sexual activity: Yes    Partners: Male    Birth control/protection: None   Social Drivers of Health   Financial Resource Strain: Low Risk  (09/09/2021)   Received from [facility]   Overall Financial Resource Strain (CARDIA)   . Difficulty of Paying Living Expenses: Not hard at all  Food Insecurity: No Food Insecurity (09/09/2021)   Received from [facility]   Hunger Vital Sign   . Within the past 12 months, you worried that your food would run out before you got the money to buy more.: Never true   . Within the past 12 months, the food you bought just didn't last and you didn't have money to get more.: Never true  Transportation Needs: No Transportation Needs (09/09/2021)   Received from [facility] - Transportation   . Lack of Transportation (Medical): No   . Lack of Transportation (Non-Medical): No  Physical Activity: Inactive (09/09/2021)   Received from [facility]   Exercise Vital Sign   . On average, how many days per week do you engage in moderate to strenuous exercise (like a brisk walk)?: 0 days   . On average, how many minutes do you engage in exercise at this level?: 0 min  Stress: No Stress Concern Present (09/09/2021)   Received from [facility] - Occupational Stress Questionnaire   . Feeling of Stress : Not at all  Social Connections: Unknown (09/09/2021)   Received from [facility]   Social Connection and Isolation Panel   . In a typical week, how many times do you talk on the phone with family,  friends, or neighbors?: More than three times a week   . How often do you get together with friends or relatives?: More than three times a week   . Do you belong to any clubs or organizations such as church groups, unions, fraternal or athletic groups, or school groups?: No   . How often do you attend meetings of the clubs or organizations you belong to?: Never   . Are you married, widowed, divorced, separated, never married, or living with a partner?: Living with partner  Housing Stability: Unknown (10/02/2023)   Housing Stability Vital Sign   . Homeless in the Last Year: No     Allergies Latex, natural rubber   Physical Exam:  BP 107/73   Pulse (!) 112   Ht 157.5 cm (5' 2)   Wt 98 kg (216 lb)   LMP 09/19/2023 (Approximate)   BMI 39.51 kg/m  Body mass index is 39.51 kg/m. General appearance:  Well nourished, well developed female in no acute distress.  Neck:  Supple, normal appearance, and no thyromegaly  Cardiovascular:Regular rate and rhythm.   Respiratory: Normal respiratory effort GI: No masses, hernias; diffusely non tender to palpation, non distended Neuro/Psych:  Normal mood and affect.   Laboratory:  No results found for: [facility]   Results RADIOLOGY Pelvic ultrasound: Clear Abdominal CT scan: No abnormalities detected; no evidence of endometriosis; no fibroids or masses; no rectal abnormalities; highlighted round ligaments   Assessment/Plan:  Azelia B Nodarse is a 33 y.o.  here for Pelvic pain and concerns for endometriosis.     Problem List Items Addressed This Visit   None Visit Diagnoses       Pelvic pain in female    -  Primary     Menorrhagia with irregular cycle         Depression screening (Z13.31)       Relevant Orders   Depression Screen -(PHQ- 2/9, BDI) (Completed)     Endometriosis            Assessment & Plan Chronic pelvic pain and menstrual irregularity, suspected endometriosis Chronic pelvic pain and menstrual irregularity with  suspected endometriosis. The pain is severe, constant, and located in the pelvic region, radiating to the rectum and legs. Abdominal pain has persisted for several years, with recent exacerbation affecting daily life. Previous imaging (ultrasound and CT scan) did not reveal definitive findings of endometriosis. Differential diagnosis includes endometriosis, but the presentation is atypical due to the late onset of symptoms at age 43. She has declined hormonal management and prefers diagnostic laparoscopy to confirm the presence of endometriosis and potentially treat lesions. Discussed the risks and benefits of diagnostic laparoscopy, including the possibility of identifying and treating lesions during the procedure. Surgery carries risks such as anesthesia complications and potential damage to abdominal structures, with a less than 1% chance of complications. She prefers to proceed with diagnostic laparoscopy for definitive diagnosis and treatment. - Schedule diagnostic laparoscopy to confirm and potentially treat endometriosis. - Have the scheduler contact her to arrange the procedure date and time. - Consider referral to gastroenterology for further evaluation of bowel symptoms, although she is already on a waiting list.     Attestation Statement:   I personally performed the service. (TP)  TITA ALOYSIUS QUA, MD  This note has been created using automated tools and reviewed for accuracy by [facility] JOSE LANDA.

## 2023-10-28 ENCOUNTER — Ambulatory Visit: Admitting: Obstetrics and Gynecology

## 2023-10-28 ENCOUNTER — Encounter: Payer: Self-pay | Admitting: Obstetrics and Gynecology

## 2023-10-28 VITALS — BP 112/73 | HR 94 | Ht 62.0 in | Wt 214.5 lb

## 2023-10-28 DIAGNOSIS — R102 Pelvic and perineal pain: Secondary | ICD-10-CM

## 2023-10-28 DIAGNOSIS — N946 Dysmenorrhea, unspecified: Secondary | ICD-10-CM | POA: Diagnosis not present

## 2023-10-28 DIAGNOSIS — N926 Irregular menstruation, unspecified: Secondary | ICD-10-CM

## 2023-10-28 DIAGNOSIS — Z3202 Encounter for pregnancy test, result negative: Secondary | ICD-10-CM | POA: Diagnosis not present

## 2023-10-28 LAB — POCT URINE PREGNANCY: Preg Test, Ur: NEGATIVE

## 2023-10-28 MED ORDER — LEVONORGEST-ETH ESTRAD 91-DAY 0.15-0.03 &0.01 MG PO TABS: 1.0000 | ORAL_TABLET | Freq: Every day | ORAL | 1 refills | Status: AC

## 2023-10-28 NOTE — Progress Notes (Signed)
 HPI:      [name] is a 33 y.o. [id number] who LMP was Patient's last menstrual period was 09/19/2023 (exact date).  Subjective:   She presents today to discuss her pelvic pain and possible endometriosis.  She has had a recent ultrasound and a CT.  These proved negative.  She states that she has pelvic pain especially with her menses.  She does state that she also has it every day but not as bad.  Also complains of some pain with bowel movements and pain inside her anal area.  She reports that the pain has become worse over the last several months.  She also states that her periods are now irregular sometimes 52 days apart. She has been prescribed OCPs in the past but has declined to take them. She cannot take NSAIDs as she has gastritis.    Hx: The following portions of the patient's history were reviewed and updated as appropriate:             She  has a past medical history of Anxiety, Fatty liver disease, nonalcoholic, GERD (gastroesophageal reflux disease), Insomnia, Ovarian cyst, Tachycardia, and Tobacco use. She does not have any pertinent problems on file. She  has a past surgical history that includes Cholecystectomy; Cesarean section; Induced abortion; LEEP (N/A, 07/28/2017); gallbladder remove; Esophagogastroduodenoscopy (egd) with propofol  (N/A, 10/28/2019); Colonoscopy with propofol  (N/A, 05/10/2020); and Esophagogastroduodenoscopy (egd) with propofol  (N/A, 05/10/2020). Her family history includes Lung cancer in her paternal grandmother. She  reports that she quit smoking about 6 years ago. Her smoking use included cigarettes. She started smoking about 17 years ago. She has a 5.5 pack-year smoking history. She has never used smokeless tobacco. She reports that she does not currently use drugs. She reports that she does not drink alcohol. She has a current medication list which includes the following prescription(s): gabapentin, levonorgestrel-ethinyl estradiol, and  norethindrone-ethinyl estradiol-fe. She is allergic to latex.       Review of Systems:  Review of Systems  Constitutional: Denied constitutional symptoms, night sweats, recent illness, fatigue, fever, insomnia and weight loss.  Eyes: Denied eye symptoms, eye pain, photophobia, vision change and visual disturbance.  Ears/Nose/Throat/Neck: Denied ear, nose, throat or neck symptoms, hearing loss, nasal discharge, sinus congestion and sore throat.  Cardiovascular: Denied cardiovascular symptoms, arrhythmia, chest pain/pressure, edema, exercise intolerance, orthopnea and palpitations.  Respiratory: Denied pulmonary symptoms, asthma, pleuritic pain, productive sputum, cough, dyspnea and wheezing.  Gastrointestinal: Denied, gastro-esophageal reflux, melena, nausea and vomiting.  Genitourinary: See HPI for additional information.  Musculoskeletal: Denied musculoskeletal symptoms, stiffness, swelling, muscle weakness and myalgia.  Dermatologic: Denied dermatology symptoms, rash and scar.  Neurologic: Denied neurology symptoms, dizziness, headache, neck pain and syncope.  Psychiatric: Denied psychiatric symptoms, anxiety and depression.  Endocrine: Denied endocrine symptoms including hot flashes and night sweats.   Meds:   Current Outpatient Medications on File Prior to Visit  Medication Sig Dispense Refill   gabapentin (NEURONTIN) 300 MG capsule Take 300 mg by mouth at bedtime.     norethindrone-ethinyl estradiol-FE (JUNEL FE 1/20) 1-20 MG-MCG tablet Take 1 tablet by mouth daily. (Patient not taking: Reported on 10/28/2023) 28 tablet 1   No current facility-administered medications on file prior to visit.      Objective:     Vitals:   10/28/23 0938  BP: 112/73  Pulse: 94   Filed Weights   10/28/23 0938  Weight: 214 lb 8 oz (97.3 kg)  Ultrasound and CT results reviewed          Assessment:    H4E7967 Patient Active Problem List   Diagnosis Date Noted   Dysplasia of  cervix, low grade (CIN 1) 06/19/2022   SAB (spontaneous abortion) 01/17/2022   OSA (obstructive sleep apnea) 12/30/2021   Morbid obesity (HCC) 12/30/2021   Supervision of other normal pregnancy, antepartum 09/09/2021   Food intolerance 07/05/2021   Abdominal pain    Stomach irritation    Altered bowel habits    Headache disorder 03/02/2020   Low back pain 03/02/2020   Palpitations 02/13/2020   Anxiety, generalized 12/15/2019   Tachycardia 12/12/2019   Fatigue 12/10/2019   Weakness 12/10/2019   Acute chest pain 11/26/2019   Anxiety, mild 11/26/2019   Difficulty sleeping 11/26/2019   Numbness and tingling of both legs 11/26/2019   Right upper quadrant pain 11/01/2019   Weight loss 11/01/2019   Dysplasia of cervix, high grade CIN 2 07/12/2019   Other specified disorders of temporomandibular joint 09/16/2018   Body mass index (BMI) 39.0-39.9, adult 04/13/2018     1. Pelvic pain   2. Dysmenorrhea   3. Irregular menstrual cycle     Patient has some signs and symptoms of endometriosis.  Today she has declined any type of cycle control to help regulate her menstrual periods, make them lighter and possibly treat endometriosis.  Based on our in-depth discussion she is most interested in surgery.   Plan:            1.  I strongly recommended 55-month cycling with OCPs to see if this alleviates some of her pain around menses as well as some of the intermenstrual pain.  I have declined to operate on her before she tries some medical methods of treatment first.  We have also discussed some of the newer medications that have been used to treat endometriosis and she has declined those at this time.  After long discussions he seems willing to try OCPs for 6 months.  She understands that should she have surgery it is not a cure for endometriosis and that endometriosis is a lifelong recurrent condition and if she does have endometriosis you will likely be placed on some type of medication to prevent  endometrial growth-like OCPs. OCPs The risks /benefits of OCPs have been explained to the patient in detail.  Product literature has been given to her where appropriate.  I have instructed her in the use of OCPs.  I have explained to the patient that OCPs are not as effective for birth control during the first month of use, and that another form of contraception should be used during this time.  Both first-day start and Sunday start have been explained.  The risks and benefits of each was discussed.  She has been made aware of  the fact that in rare circumstances, other medications may affect the efficacy of OCPs.  I have answered all of her questions, and I believe that she has an understanding of the effectiveness and use of OCPs. Will start Seasonique.  First day first day start. Orders Orders Placed This Encounter  Procedures   POCT urine pregnancy     Meds ordered this encounter  Medications   Levonorgestrel-Ethinyl Estradiol (AMETHIA) 0.15-0.03 &0.01 MG tablet    Sig: Take 1 tablet by mouth at bedtime.    Dispense:  84 tablet    Refill:  1      F/U  Return in about 6 months (around  04/29/2024).  Alm DOROTHA Sar, M.D. 10/28/2023 10:15 AM

## 2023-10-28 NOTE — Progress Notes (Signed)
 Patient presents today to discuss a history of irregular cycles and the possibility of endometriosis. She reports her cycles come every 53 days and they are heavy with severe cramping, not currently using any form of cycle control. Recent ultrasound in June completed. UPT negative.

## 2023-12-03 ENCOUNTER — Ambulatory Visit
Admission: RE | Admit: 2023-12-03 | Discharge: 2023-12-03 | Disposition: A | Discharge status: Home / Self Care | Source: Ambulatory Visit | Attending: Physician Assistant | Admitting: Physician Assistant

## 2023-12-03 ENCOUNTER — Ambulatory Visit
Admission: RE | Admit: 2023-12-03 | Discharge: 2023-12-03 | Disposition: A | Discharge status: Home / Self Care | Attending: Physician Assistant | Admitting: Physician Assistant

## 2023-12-03 ENCOUNTER — Other Ambulatory Visit: Payer: Self-pay | Admitting: Physician Assistant

## 2023-12-03 DIAGNOSIS — R52 Pain, unspecified: Secondary | ICD-10-CM

## 2023-12-03 DIAGNOSIS — M533 Sacrococcygeal disorders, not elsewhere classified: Secondary | ICD-10-CM

## 2023-12-09 ENCOUNTER — Other Ambulatory Visit: Payer: Self-pay | Admitting: Medical Genetics

## 2023-12-16 ENCOUNTER — Ambulatory Visit: Admitting: Pediatrics

## 2023-12-16 ENCOUNTER — Encounter: Payer: Self-pay | Admitting: Pediatrics

## 2023-12-16 VITALS — BP 117/87 | HR 85 | Temp 97.5°F | Ht 63.0 in | Wt 217.0 lb

## 2023-12-16 DIAGNOSIS — Z7689 Persons encountering health services in other specified circumstances: Secondary | ICD-10-CM

## 2023-12-16 DIAGNOSIS — R102 Pelvic and perineal pain: Secondary | ICD-10-CM

## 2023-12-16 DIAGNOSIS — K2970 Gastritis, unspecified, without bleeding: Secondary | ICD-10-CM | POA: Insufficient documentation

## 2023-12-16 DIAGNOSIS — G8929 Other chronic pain: Secondary | ICD-10-CM | POA: Diagnosis not present

## 2023-12-16 NOTE — Progress Notes (Signed)
 Establish Care Note  BP 117/87   Pulse 85   Temp (!) 97.5 F (36.4 C) (Oral)   Ht 5' 3 (1.6 m)   Wt 217 lb (98.4 kg)   LMP 12/03/2023 (Approximate)   SpO2 98%   BMI 38.44 kg/m    Subjective:    Patient ID: Hunter KATHEE Shallow, female    DOB: 1991/03/04, 33 y.o.   MRN: [id number]  HPI: BRYNLEIGH SEQUEIRA is a 33 y.o. female  Chief Complaint  Patient presents with   Establish Care    Back and pelvic pain since June     Establishing care, the following was discussed today:  Discussed the use of AI scribe software for clinical note transcription with the patient, who gave verbal consent to proceed.  History of Present Illness   Kodee B Mikella Linsley is a 33 year old female who presents with chronic pelvic pain.  She has been experiencing chronic pelvic pain since June 26th, described as severe period cramps radiating to the lower back and a deep pain near the anus. The pain is constant, throbbing, and primarily located on the left side, sometimes radiating to her leg. No bleeding during urination or defecation and no significant pain during intercourse. An ultrasound and CT scan did not reveal any abnormalities, and endometriosis was ruled out after surgery; a small cyst on the right side was found, which the patient was told was related to normal ovulation.  She has a history of chronic gastritis diagnosed five years ago after an endoscopy and colonoscopy. Despite abstaining from alcohol and minimal medication use, she describes the gastritis pain as her 'new norm.' She has been on a waiting list for a gastroenterologist since May, with multiple referrals made, but appointments are unavailable until July. An x-ray and blood work returned normal results.  She is currently taking gabapentin, which helps with nighttime shaking and sleep disturbances but does not alleviate her pain. Heat application, such as heating pads and hot baths, provides some relief. She is hesitant to take additional  medications due to her responsibilities as a mother and the desire to avoid being 'zombified.'  She is a mother of two children, one of whom has autism, and expresses frustration with the lack of diagnosis and the impact of the pain on her quality of life.        Current Outpatient Medications on File Prior to Visit  Medication Sig Dispense Refill   gabapentin (NEURONTIN) 300 MG capsule Take 300 mg by mouth at bedtime.     Levonorgestrel-Ethinyl Estradiol (AMETHIA) 0.15-0.03 &0.01 MG tablet Take 1 tablet by mouth at bedtime. (Patient not taking: Reported on 12/16/2023) 84 tablet 1   norethindrone-ethinyl estradiol-FE (JUNEL FE 1/20) 1-20 MG-MCG tablet Take 1 tablet by mouth daily. (Patient not taking: Reported on 10/28/2023) 28 tablet 1   No current facility-administered medications on file prior to visit.    #HM Will review HM records and updated as needed.  Relevant past medical, surgical, family and social history reviewed and updated as indicated. Interim medical history since our last visit reviewed. Allergies and medications reviewed and updated.  ROS per HPI unless specifically indicated above     Objective:    BP 117/87   Pulse 85   Temp (!) 97.5 F (36.4 C) (Oral)   Ht 5' 3 (1.6 m)   Wt 217 lb (98.4 kg)   LMP 12/03/2023 (Approximate)   SpO2 98%   BMI 38.44 kg/m   Wt Readings  from Last 3 Encounters:  12/16/23 217 lb (98.4 kg)  10/28/23 214 lb 8 oz (97.3 kg)  09/30/23 215 lb (97.5 kg)     Physical Exam Constitutional:      Appearance: Normal appearance.  Pulmonary:     Effort: Pulmonary effort is normal.  Musculoskeletal:        General: Normal range of motion.  Skin:    Comments: Normal skin color  Neurological:     General: No focal deficit present.     Mental Status: She is alert. Mental status is at baseline.  Psychiatric:        Mood and Affect: Mood normal.        Behavior: Behavior normal.        Thought Content: Thought content normal.         Assessment & Plan:  Assessment & Plan   Chronic pelvic pain in female Assessment & Plan: Chronic pelvic pain with severe cramping and throbbing, radiating to lower back and left leg. Previous imaging inconclusive. Endometriosis ruled out. Possible musculoskeletal etiology such as pelvic floor dysfunction. No gastrointestinal cause identified. - Order MRI of the pelvis pending insurance approval. - Refer to pelvic floor physical therapy for evaluation and treatment.  Orders: -     Ambulatory referral to Physical Therapy -     MR PELVIS W WO CONTRAST; Future  Gastritis, presence of bleeding unspecified, unspecified chronicity, unspecified gastritis type Assessment & Plan: Chronic abdominal pain for five years, previously diagnosed as chronic gastritis. Endoscopy and colonoscopy unremarkable. No gastrointestinal cause for pelvic pain identified. - Review previous imaging and surgical notes for any overlooked findings.    Encounter to establish care Reviewed available patient record including history, medications, problem list. HM updated as able. Will review and/or request outside records (if applicable) and will fill remaining HM gaps as needed at follow up visit.   Follow up plan: Return if symptoms worsen or fail to improve.  Hadassah SHAUNNA Nett, MD

## 2023-12-16 NOTE — Assessment & Plan Note (Signed)
 Chronic pelvic pain with severe cramping and throbbing, radiating to lower back and left leg. Previous imaging inconclusive. Endometriosis ruled out. Possible musculoskeletal etiology such as pelvic floor dysfunction. No gastrointestinal cause identified. - Order MRI of the pelvis pending insurance approval. - Refer to pelvic floor physical therapy for evaluation and treatment.

## 2023-12-16 NOTE — Assessment & Plan Note (Signed)
 Chronic abdominal pain for five years, previously diagnosed as chronic gastritis. Endoscopy and colonoscopy unremarkable. No gastrointestinal cause for pelvic pain identified. - Review previous imaging and surgical notes for any overlooked findings.

## 2023-12-16 NOTE — Patient Instructions (Signed)

## 2024-01-01 ENCOUNTER — Ambulatory Visit: Payer: Self-pay | Admitting: Pediatrics

## 2024-01-01 ENCOUNTER — Ambulatory Visit
Admission: RE | Admit: 2024-01-01 | Discharge: 2024-01-01 | Disposition: A | Discharge status: Home / Self Care | Source: Ambulatory Visit | Attending: Pediatrics | Admitting: Pediatrics

## 2024-01-01 ENCOUNTER — Telehealth: Payer: Self-pay | Admitting: Pediatrics

## 2024-01-01 DIAGNOSIS — R102 Pelvic and perineal pain unspecified side: Secondary | ICD-10-CM | POA: Diagnosis present

## 2024-01-01 DIAGNOSIS — G8929 Other chronic pain: Secondary | ICD-10-CM | POA: Diagnosis present

## 2024-01-01 MED ORDER — GADOBUTROL 1 MMOL/ML IV SOLN
10.0000 mL | Freq: Once | INTRAVENOUS | Status: AC | PRN
  Administered 2024-01-01: 10 mL via INTRAVENOUS

## 2024-01-01 NOTE — Telephone Encounter (Signed)
 Copied from CRM [id number]. Topic: Referral - Status >> Jan 01, 2024 12:05 PM Emylou G wrote: Reason for CRM: Patient called.. checking status of her referral for Pelvic floor therapy.  Pls call her back to advise

## 2024-01-01 NOTE — Telephone Encounter (Signed)
 Referral team- can you guys check on this referral please? Patient has not heard anything.

## 2024-01-07 ENCOUNTER — Other Ambulatory Visit

## 2024-01-21 ENCOUNTER — Ambulatory Visit

## 2024-01-28 ENCOUNTER — Ambulatory Visit: Attending: Pediatrics

## 2024-02-04 ENCOUNTER — Ambulatory Visit

## 2024-02-11 ENCOUNTER — Ambulatory Visit

## 2024-02-25 ENCOUNTER — Ambulatory Visit

## 2024-03-10 ENCOUNTER — Ambulatory Visit

## 2024-03-14 ENCOUNTER — Ambulatory Visit

## 2024-03-21 ENCOUNTER — Ambulatory Visit

## 2024-03-28 ENCOUNTER — Ambulatory Visit

## 2024-04-02 NOTE — Progress Notes (Unsigned)
 Sleep Medicine   Office Visit  Patient Name: Emily Nash DOB: 1990/06/07 MRN [id number]    Chief Complaint: ***  Brief History:  Emily Nash presents for an initial consult for sleep evaluation with a reported  history of headaches. She reports her sleep quality is ***. This is noted *** nights. The patient's bed partner reports  *** at night. The patient relates the following symptoms: *** are also present. The patient goes to sleep at *** and wakes up at ***. Sleep quality is *** when outside home environment.  Patient has noted *** of her legs at night.  The patient  relates *** behavior during the night.  The patient *** a history of psychiatric problems. The Epworth Sleepiness Score is *** out of 24 .  The patient relates  Cardiovascular risk factors include: *** The patient reports ***    ROS  General: (-) fever, (-) chills, (-) night sweat Nose and Sinuses: (-) nasal stuffiness or itchiness, (-) postnasal drip, (-) nosebleeds, (-) sinus trouble. Mouth and Throat: (-) sore throat, (-) hoarseness. Neck: (-) swollen glands, (-) enlarged thyroid , (-) neck pain. Respiratory: *** cough, *** shortness of breath, *** wheezing. Neurologic: *** numbness, *** tingling. Psychiatric: *** anxiety, *** depression Sleep behavior: ***sleep paralysis ***hypnogogic hallucinations ***dream enactment      ***vivid dreams ***cataplexy ***night terrors ***sleep walking   Current Medication: Outpatient Encounter Medications as of 04/04/2024  Medication Sig   gabapentin (NEURONTIN) 300 MG capsule Take 300 mg by mouth at bedtime.   Levonorgestrel-Ethinyl Estradiol (AMETHIA) 0.15-0.03 &0.01 MG tablet Take 1 tablet by mouth at bedtime. (Patient not taking: Reported on 12/16/2023)   norethindrone-ethinyl estradiol-FE (JUNEL FE 1/20) 1-20 MG-MCG tablet Take 1 tablet by mouth daily. (Patient not taking: Reported on 10/28/2023)   No facility-administered encounter medications on file as of 04/04/2024.     Surgical History: Past Surgical History:  Procedure Laterality Date   CESAREAN SECTION     CHOLECYSTECTOMY     COLONOSCOPY WITH PROPOFOL  N/A 05/10/2020   Procedure: COLONOSCOPY WITH PROPOFOL ;  Surgeon: Janalyn Keene KATHEE, MD;  Location: [facility] ENDOSCOPY;  Service: Endoscopy;  Laterality: N/A;   ESOPHAGOGASTRODUODENOSCOPY (EGD) WITH PROPOFOL  N/A 10/28/2019   Procedure: ESOPHAGOGASTRODUODENOSCOPY (EGD) WITH PROPOFOL ;  Surgeon: Maryruth Ole DASEN, MD;  Location: [facility] ENDOSCOPY;  Service: Endoscopy;  Laterality: N/A;   ESOPHAGOGASTRODUODENOSCOPY (EGD) WITH PROPOFOL  N/A 05/10/2020   Procedure: ESOPHAGOGASTRODUODENOSCOPY (EGD) WITH PROPOFOL ;  Surgeon: Janalyn Keene KATHEE, MD;  Location: [facility] ENDOSCOPY;  Service: Endoscopy;  Laterality: N/A;   gallbladder remove     INDUCED ABORTION     LEEP N/A 07/28/2017   Procedure: LOOP ELECTROSURGICAL EXCISION PROCEDURE (LEEP);  Surgeon: Victor Claudell SAUNDERS, MD;  Location: [facility] ORS;  Service: Gynecology;  Laterality: N/A;    Medical History: Past Medical History:  Diagnosis Date   Anxiety    Dysplasia of cervix, high grade CIN 2 07/12/2019   Dysplasia of cervix, low grade (CIN 1) 06/19/2022   Fatty liver disease, nonalcoholic    GERD (gastroesophageal reflux disease)    occ-tums prn   Insomnia    Morbid obesity (HCC) 12/30/2021   Ovarian cyst    Tachycardia    PT STATES THAT OCC SHE WILL FEEL HER HR GET REALLY FAST AND FEELS LIKE SHE CANT GET HER BREATH-ALL OF THIS RESOLVES WITHIN A COUPLE OF SECONDS   Tobacco use    quit 2020    Family History: Non contributory to the present illness  Social History: Social History   Socioeconomic  History   Marital status: Significant Other    Spouse name: Not on file   Number of children: 2   Years of education: 42   Highest education level: 12th grade  Occupational History   Occupation: stay at home mom  Tobacco Use   Smoking status: Former    Current packs/day: 0.00    Average packs/day: 0.5  packs/day for 11.0 years (5.5 ttl pk-yrs)    Types: Cigarettes    Start date: 05/23/2006    Quit date: 05/22/2017    Years since quitting: 6.8   Smokeless tobacco: Never  Vaping Use   Vaping status: Former  Substance and Sexual Activity   Alcohol use: Never   Drug use: Never   Sexual activity: Yes    Birth control/protection: None  Other Topics Concern   Not on file  Social History Narrative   Not on file   Social Drivers of Health   Tobacco Use: Medium Risk (03/15/2024)   [facility]   Patient History    Smoking Tobacco Use: Former    Smokeless Tobacco Use: Never    Passive Exposure: Not on file  Financial Resource Strain: Low Risk  (03/15/2024)   Received from [facility]   Overall Financial Resource Strain (CARDIA)    Difficulty of Paying Living Expenses: Not hard at all  Food Insecurity: No Food Insecurity (03/15/2024)   Received from [facility]   Epic    Within the past 12 months, you worried that your food would run out before you got the money to buy more.: Never true    Within the past 12 months, the food you bought just didn't last and you didn't have money to get more.: Never true  Transportation Needs: No Transportation Needs (03/15/2024)   Received from [facility] - Transportation    In the past 12 months, has lack of transportation kept you from medical appointments or from getting medications?: No    Lack of Transportation (Non-Medical): No  Physical Activity: Insufficiently Active (12/12/2023)   Exercise Vital Sign    Days of Exercise per Week: 3 days    Minutes of Exercise per Session: 30 min  Stress: No Stress Concern Present (12/12/2023)   [employer] - Occupational Stress Questionnaire    Feeling of Stress: Not at all  Social Connections: Socially Isolated (12/12/2023)   Social Connection and Isolation Panel    Frequency of  Communication with Friends and Family: More than three times a week    Frequency of Social Gatherings with Friends and Family: Twice a week    Attends Religious Services: Never    Database Administrator or Organizations: No    Attends Engineer, Structural: Not on file    Marital Status: Never married  Intimate Partner Violence: Not At Risk (09/09/2021)   Humiliation, Afraid, Rape, and Kick questionnaire    Fear of Current or Ex-Partner: No    Emotionally Abused: No    Physically Abused: No    Sexually Abused: No  Depression (PHQ2-9): Not on file  Alcohol Screen: Not on file  Housing: Low Risk  (03/15/2024)   Received from [facility]   Epic    In the last 12 months, was there a time when you were not able to pay the mortgage or rent on time?: No    In the past 12 months, how many times  have you moved where you were living?: 0    At any time in the past 12 months, were you homeless or living in a shelter (including now)?: No  Utilities: Not At Risk (03/15/2024)   Received from [facility]   Epic    In the past 12 months has the electric, gas, oil, or water company threatened to shut off services in your home?: No  Health Literacy: Adequate Health Literacy (11/09/2023)   Received from [facility]   B1300 Health Literacy    How often do you need to have someone help you when you read instructions, pamphlets, or other written material from your doctor or pharmacy?: Never    Vital Signs: There were no vitals taken for this visit. There is no height or weight on file to calculate BMI.   Examination: General Appearance: The patient is well-developed, well-nourished, and in no distress. Neck Circumference: *** Skin: Gross inspection of skin unremarkable. Head: normocephalic, no gross deformities. Eyes: no gross deformities noted. ENT: ears appear grossly normal Neurologic: Alert and oriented. No involuntary  movements.    STOP BANG RISK ASSESSMENT S (snore) Have you been told that you snore?     YES/NO   T (tired) Are you often tired, fatigued, or sleepy during the day?   YES/NO  O (obstruction) Do you stop breathing, choke, or gasp during sleep? YES/NO   P (pressure) Do you have or are you being treated for high blood pressure? YES/NO   B (BMI) Is your body index greater than 35 kg/m? YES/NO   A (age) Are you 70 years old or older? NO   N (neck) Do you have a neck circumference greater than 16 inches?   YES/NO   G (gender) Are you a female? NO   TOTAL STOP/BANG YES ANSWERS                                                                A STOP-Bang score of 2 or less is considered low risk, and a score of 5 or more is high risk for having either moderate or severe OSA. For people who score 3 or 4, doctors may need to perform further assessment to determine how likely they are to have OSA.         EPWORTH SLEEPINESS SCALE:  Scale:  (0)= no chance of dozing; (1)= slight chance of dozing; (2)= moderate chance of dozing; (3)= high chance of dozing  Chance  Situtation    Sitting and reading: ***    Watching TV: ***    Sitting Inactive in public: ***    As a passenger in car: ***      Lying down to rest: ***    Sitting and talking: ***    Sitting quielty after lunch: ***    In a car, stopped in traffic: ***   TOTAL SCORE:   *** out of 24    SLEEP STUDIES:  No studies on file.   LABS: No results found for this or any previous visit (from the past 2160 hours).  Radiology: MR Pelvis W Wo Contrast Result Date: 01/01/2024 CLINICAL DATA:  chronic pelvic pain. EXAM: MRI PELVIS WITHOUT AND WITH CONTRAST TECHNIQUE: Multiplanar multisequence MR imaging of the pelvis  was performed both before and after administration of intravenous contrast. CONTRAST:  10mL GADAVIST  GADOBUTROL  1 MMOL/ML IV SOLN COMPARISON:  Pelvic ultrasound from 09/18/2023 and CT scan abdomen and pelvis  from 10/03/2022. FINDINGS: Urinary Tract: Limited evaluation of lower 2/3 of bilateral kidneys on coronal T2 weighted images. No focal mass or hydronephrosis. Urinary bladder is partially distended limiting evaluation; however, appears grossly within normal limits. Bowel: Unremarkable visualized pelvic bowel loops. Unremarkable appendix. Vascular/Lymphatic: No pathologically enlarged lymph nodes. No significant vascular abnormality seen. Reproductive: Normal-size anteverted uterus measuring up to 5.0 x 5.6 x 6.2 cm. No focal mass. Normal junctional zone. Normal endometrium measuring up to 8 mm in thickness. No focal endometrial mass. Essentially unremarkable cervix noting a subcentimeter sized nabothian cyst. No focal cervical or vaginal mass. Bilateral ovaries are visualized (series 3, image 40) and appears within normal limits. No focal ovarian or adnexal mass. Other:  None. Musculoskeletal: No suspicious bone lesions identified. IMPRESSION: 1. No acute inflammatory process identified within the pelvis. 2. No focal mass, lymphadenopathy or free fluid. Electronically Signed   By: Ree Molt M.D.   On: 01/01/2024 10:58    No results found.  No results found.    Assessment and Plan: Patient Active Problem List   Diagnosis Date Noted   Chronic pelvic pain in female 12/16/2023   Gastritis 12/16/2023     PLAN OSA:   Patient evaluation suggests high risk of sleep disordered breathing due to *** Patient has comorbid cardiovascular risk factors including: *** which could be exacerbated by pathologic sleep-disordered breathing.  Suggest: *** to assess/treat the patient's sleep disordered breathing. The patient was also counselled on *** to optimize sleep health.  PLAN hypersomnia:  Patient evaluation suggests significant daytime hypersomnia.  The Epworth Sleepiness Score is elevated at *** out of 24. Patient *** drowsy driving. The patient *** MVA due to sleepiness.  The patient *** restless  leg symptoms which exacerbate *** for *** nights per week. The patient *** periodic limb movements which exacerbate ***  for *** nights per week. Suggest: ***  Also suggest ***  PLAN insomnia:  Patient evaluation suggests *** insomnia. This is a chronic disorder. This has been a concern for *** and causes impaired daytime functioning. The patient exhibits comorbid ***  The history *** suggest the insomnia predates the use of hypnotic medications. The symptoms *** with the discontinuation of these medications. There is no obvious medical, psychiatric or pharmacologic abuse issues ot account for the insomnia.  Treatment recommendations include: *** The patient should maintain a sleep log and calculate total sleep time for 1-2 weeks. Set bed and wake times for achieve 85% sleep efficiency for one week. Once this is achieved  time in bed can be gradually increased. A pharmacologic treatment approach would include a trial of *** for the next ***  months. During this time the patient is to maintain a sleep diary to track progress.    ***  General Counseling: I have discussed the findings of the evaluation and examination with Emily Nash.  I have also discussed any further diagnostic evaluation thatmay be needed or ordered today. Emily Nash verbalizes understanding of the findings of todays visit. We also reviewed her medications today and discussed drug interactions and side effects including but not limited excessive drowsiness and altered mental states. We also discussed that there is always a risk not just to her but also people around her. she has been encouraged to call the office with any questions or concerns that should  arise related to todays visit.  No orders of the defined types were placed in this encounter.       I have personally obtained a history, evaluated the patient, evaluated pertinent data, formulated the assessment and plan and placed orders.    Elfreda DELENA Bathe, MD [facility] Diplomate  [facility] Pulmonary and Critical Care Medicine Sleep medicine

## 2024-04-04 ENCOUNTER — Ambulatory Visit

## 2024-04-04 ENCOUNTER — Emergency Department
Admission: EM | Admit: 2024-04-04 | Discharge: 2024-04-04 | Disposition: A | Discharge status: Home / Self Care | Attending: Emergency Medicine | Admitting: Emergency Medicine

## 2024-04-04 ENCOUNTER — Emergency Department

## 2024-04-04 ENCOUNTER — Other Ambulatory Visit: Payer: Self-pay

## 2024-04-04 DIAGNOSIS — R519 Headache, unspecified: Secondary | ICD-10-CM | POA: Diagnosis present

## 2024-04-04 DIAGNOSIS — Z720 Tobacco use: Secondary | ICD-10-CM | POA: Diagnosis not present

## 2024-04-04 LAB — POC URINE PREG, ED: Preg Test, Ur: NEGATIVE

## 2024-04-04 MED ORDER — BUTALBITAL-APAP-CAFFEINE 50-325-40 MG PO TABS: 1.0000 | ORAL_TABLET | Freq: Four times a day (QID) | ORAL | 0 refills | Status: AC | PRN

## 2024-04-04 NOTE — Discharge Instructions (Signed)
 Follow-up with your primary care provider if any continued problems or not improving.  A prescription for Fioricet was sent to the pharmacy for you to take as directed.  This medication could cause drowsiness so therefore do not drive or operate machinery while taking this medication.

## 2024-04-04 NOTE — ED Provider Notes (Signed)
 "  [facility] Provider Note    Event Date/Time   First MD Initiated Contact with Patient 04/04/24 0745     (approximate)   History   Headache   HPI  Emily Nash is a 34 y.o. female   presents to the ED with complaint of generalized headache for the last 4 weeks.  She has been to her PCP where she was placed on Flexeril and gabapentin.  She states she also received an injection of Toradol  which did little for her headache.  She denies any URI symptoms, fever, cough, congestion, visual changes, dizziness.  Patient states that she has never had a CT scan of her head.  No prior diagnosis of migraines.  She has also taken over-the-counter medication without any relief.  Patient does have a history of GERD, anxiety, fatty liver disease, tobacco use, insomnia.      Physical Exam   Triage Vital Signs: ED Triage Vitals  Encounter Vitals Group     BP 04/04/24 0733 115/81     Girls Systolic BP Percentile --      Girls Diastolic BP Percentile --      Boys Systolic BP Percentile --      Boys Diastolic BP Percentile --      Pulse Rate 04/04/24 0733 99     Resp 04/04/24 0733 16     Temp 04/04/24 0733 98.6 F (37 C)     Temp src --      SpO2 04/04/24 0733 97 %     Weight 04/04/24 0731 226 lb (102.5 kg)     Height 04/04/24 0731 5' 2 (1.575 m)     Head Circumference --      Peak Flow --      Pain Score 04/04/24 0731 8     Pain Loc --      Pain Education --      Exclude from Growth Chart --     Most recent vital signs: Vitals:   04/04/24 0733  BP: 115/81  Pulse: 99  Resp: 16  Temp: 98.6 F (37 C)  SpO2: 97%     General: Awake, no distress.  Alert, talkative, answers questions appropriately. CV:  Good peripheral perfusion.  Heart regular rate rhythm. Resp:  Normal effort.  Lungs are clear bilaterally. Abd:  No distention.  Other:  PERRLA, EOMI's, cranial nerves II through XII grossly intact.  Speech is normal.  Patient is able to stand and ambulate  without any assistance.  Good muscle strength bilaterally.   ED Results / Procedures / Treatments   Labs (all labs ordered are listed, but only abnormal results are displayed) Labs Reviewed  POC URINE PREG, ED     RADIOLOGY CT head per radiology is negative for acute intracranial changes.    PROCEDURES:  Critical Care performed:   Procedures   MEDICATIONS ORDERED IN ED: Medications - No data to display   IMPRESSION / MDM / ASSESSMENT AND PLAN / ED COURSE  I reviewed the triage vital signs and the nursing notes.   Differential diagnosis includes, but is not limited to, generalized headache, cluster headache, migraine unlikely but considered.  Space-occupying lesion to be ruled out.  34 year old female presents to the ED with complaint of generalized headache for the last 4 weeks.  Currently she is taking gabapentin, Flexeril and was given an injection of Toradol  which did not help with her headache.  Patient did drive herself to the emergency department and has continued  with her normal activities.  I explained to her that we were unable to give any medication in the ED that could cause drowsiness.  A prescription for Fioricet was sent to the pharmacy for her to take as needed for headache.  She is strongly encouraged to follow-up with her PCP if any continued problems.      Patient's presentation is most consistent with acute illness / injury with system symptoms.  FINAL CLINICAL IMPRESSION(S) / ED DIAGNOSES   Final diagnoses:  Headache disorder     Rx / DC Orders   ED Discharge Orders          Ordered    butalbital -acetaminophen -caffeine  (FIORICET) 50-325-40 MG tablet  Every 6 hours PRN        04/04/24 0944             Note:  This document was prepared using Dragon voice recognition software and may include unintentional dictation errors.   Saunders Shona CROME, PA-C 04/04/24 1409    Arlander Charleston, MD 04/04/24 1421  "

## 2024-04-04 NOTE — ED Triage Notes (Signed)
 Pt to ED for h/a x4 weeks. Denies n/v. PCP gave meds for same.
